# Patient Record
Sex: Female | Born: 1970 | Race: White | Hispanic: No | Marital: Single | Smoking: Never smoker
Health system: Southern US, Community
[De-identification: ages and names within clinical notes are randomized; demographics above are authoritative.]

## PROBLEM LIST (undated history)

## (undated) ENCOUNTER — Ambulatory Visit: Payer: Medicaid Other

## (undated) DIAGNOSIS — T424X1A Poisoning by benzodiazepines, accidental (unintentional), initial encounter: Secondary | ICD-10-CM

## (undated) DIAGNOSIS — I959 Hypotension, unspecified: Secondary | ICD-10-CM

## (undated) DIAGNOSIS — Z91410 Personal history of adult physical and sexual abuse: Secondary | ICD-10-CM

## (undated) DIAGNOSIS — F1320 Sedative, hypnotic or anxiolytic dependence, uncomplicated: Secondary | ICD-10-CM

## (undated) DIAGNOSIS — G8929 Other chronic pain: Secondary | ICD-10-CM

## (undated) DIAGNOSIS — I509 Heart failure, unspecified: Secondary | ICD-10-CM

## (undated) DIAGNOSIS — I499 Cardiac arrhythmia, unspecified: Secondary | ICD-10-CM

## (undated) DIAGNOSIS — J45909 Unspecified asthma, uncomplicated: Secondary | ICD-10-CM

## (undated) DIAGNOSIS — F1910 Other psychoactive substance abuse, uncomplicated: Secondary | ICD-10-CM

## (undated) DIAGNOSIS — G43909 Migraine, unspecified, not intractable, without status migrainosus: Secondary | ICD-10-CM

## (undated) DIAGNOSIS — M359 Systemic involvement of connective tissue, unspecified: Secondary | ICD-10-CM

## (undated) DIAGNOSIS — F603 Borderline personality disorder: Secondary | ICD-10-CM

## (undated) DIAGNOSIS — D509 Iron deficiency anemia, unspecified: Secondary | ICD-10-CM

## (undated) DIAGNOSIS — M545 Low back pain, unspecified: Secondary | ICD-10-CM

## (undated) DIAGNOSIS — F329 Major depressive disorder, single episode, unspecified: Secondary | ICD-10-CM

## (undated) DIAGNOSIS — F419 Anxiety disorder, unspecified: Secondary | ICD-10-CM

## (undated) DIAGNOSIS — I2699 Other pulmonary embolism without acute cor pulmonale: Secondary | ICD-10-CM

## (undated) DIAGNOSIS — E162 Hypoglycemia, unspecified: Secondary | ICD-10-CM

## (undated) DIAGNOSIS — M797 Fibromyalgia: Secondary | ICD-10-CM

## (undated) DIAGNOSIS — G9340 Encephalopathy, unspecified: Secondary | ICD-10-CM

## (undated) DIAGNOSIS — I2510 Atherosclerotic heart disease of native coronary artery without angina pectoris: Secondary | ICD-10-CM

## (undated) DIAGNOSIS — K219 Gastro-esophageal reflux disease without esophagitis: Secondary | ICD-10-CM

## (undated) DIAGNOSIS — E538 Deficiency of other specified B group vitamins: Secondary | ICD-10-CM

## (undated) DIAGNOSIS — K859 Acute pancreatitis without necrosis or infection, unspecified: Secondary | ICD-10-CM

## (undated) DIAGNOSIS — R55 Syncope and collapse: Secondary | ICD-10-CM

## (undated) DIAGNOSIS — I82409 Acute embolism and thrombosis of unspecified deep veins of unspecified lower extremity: Secondary | ICD-10-CM

## (undated) DIAGNOSIS — G894 Chronic pain syndrome: Secondary | ICD-10-CM

## (undated) DIAGNOSIS — D72819 Decreased white blood cell count, unspecified: Secondary | ICD-10-CM

## (undated) DIAGNOSIS — I639 Cerebral infarction, unspecified: Secondary | ICD-10-CM

## (undated) DIAGNOSIS — D689 Coagulation defect, unspecified: Secondary | ICD-10-CM

## (undated) DIAGNOSIS — F1120 Opioid dependence, uncomplicated: Secondary | ICD-10-CM

## (undated) DIAGNOSIS — R109 Unspecified abdominal pain: Secondary | ICD-10-CM

## (undated) DIAGNOSIS — E46 Unspecified protein-calorie malnutrition: Secondary | ICD-10-CM

## (undated) DIAGNOSIS — T50901A Poisoning by unspecified drugs, medicaments and biological substances, accidental (unintentional), initial encounter: Secondary | ICD-10-CM

## (undated) DIAGNOSIS — R9431 Abnormal electrocardiogram [ECG] [EKG]: Secondary | ICD-10-CM

## (undated) DIAGNOSIS — E559 Vitamin D deficiency, unspecified: Secondary | ICD-10-CM

## (undated) DIAGNOSIS — Z7901 Long term (current) use of anticoagulants: Secondary | ICD-10-CM

## (undated) DIAGNOSIS — Z8742 Personal history of other diseases of the female genital tract: Secondary | ICD-10-CM

## (undated) DIAGNOSIS — F1920 Other psychoactive substance dependence, uncomplicated: Secondary | ICD-10-CM

## (undated) DIAGNOSIS — F32A Depression, unspecified: Secondary | ICD-10-CM

## (undated) HISTORY — PX: COLONOSCOPY: SHX174

## (undated) HISTORY — DX: Other psychoactive substance dependence, uncomplicated: F19.20

## (undated) HISTORY — PX: IVC FILTER INSERTION: CATH118245

## (undated) HISTORY — PX: ABDOMINAL HYSTERECTOMY: SHX81

## (undated) HISTORY — DX: Atherosclerotic heart disease of native coronary artery without angina pectoris: I25.10

## (undated) HISTORY — PX: HERNIA REPAIR: SHX51

## (undated) HISTORY — DX: Other psychoactive substance abuse, uncomplicated: F19.10

## (undated) HISTORY — DX: Iron deficiency anemia, unspecified: D50.9

## (undated) HISTORY — PX: RESECTION SMALL BOWEL / CLOSURE ILEOSTOMY: SUR1248

## (undated) HISTORY — DX: Decreased white blood cell count, unspecified: D72.819

## (undated) HISTORY — DX: Syncope and collapse: R55

## (undated) HISTORY — DX: Personal history of adult physical and sexual abuse: Z91.410

## (undated) HISTORY — DX: Acute pancreatitis without necrosis or infection, unspecified: K85.90

## (undated) HISTORY — DX: Abnormal electrocardiogram (ECG) (EKG): R94.31

## (undated) HISTORY — PX: TONSILLECTOMY: SUR1361

## (undated) HISTORY — PX: UPPER GI ENDOSCOPY: SHX6162

## (undated) HISTORY — PX: CERVICAL CERCLAGE: SHX1329

## (undated) HISTORY — DX: Borderline personality disorder: F60.3

## (undated) HISTORY — DX: Anxiety disorder, unspecified: F41.9

## (undated) HISTORY — DX: Cardiac arrhythmia, unspecified: I49.9

## (undated) HISTORY — DX: Hypotension, unspecified: I95.9

## (undated) HISTORY — DX: Major depressive disorder, single episode, unspecified: F32.9

## (undated) HISTORY — PX: CHOLECYSTECTOMY: SHX55

## (undated) HISTORY — DX: Personal history of other diseases of the female genital tract: Z87.42

## (undated) HISTORY — DX: Coagulation defect, unspecified: D68.9

## (undated) HISTORY — PX: APPENDECTOMY: SHX54

## (undated) HISTORY — DX: Poisoning by benzodiazepines, accidental (unintentional), initial encounter: T42.4X1A

## (undated) HISTORY — DX: Unspecified protein-calorie malnutrition: E46

---

## 2001-02-18 HISTORY — PX: GASTRIC BYPASS: SHX52

## 2004-05-06 ENCOUNTER — Emergency Department: Payer: Self-pay | Admitting: Emergency Medicine

## 2004-06-02 ENCOUNTER — Emergency Department: Payer: Self-pay | Admitting: Emergency Medicine

## 2004-06-07 ENCOUNTER — Emergency Department: Payer: Self-pay | Admitting: Emergency Medicine

## 2004-06-30 ENCOUNTER — Other Ambulatory Visit: Payer: Self-pay

## 2004-06-30 ENCOUNTER — Inpatient Hospital Stay: Payer: Self-pay | Admitting: Internal Medicine

## 2004-07-01 ENCOUNTER — Other Ambulatory Visit: Payer: Self-pay

## 2004-08-11 ENCOUNTER — Other Ambulatory Visit: Payer: Self-pay

## 2004-08-11 ENCOUNTER — Emergency Department: Payer: Self-pay | Admitting: Emergency Medicine

## 2004-08-17 ENCOUNTER — Emergency Department: Payer: Self-pay | Admitting: Emergency Medicine

## 2004-08-17 ENCOUNTER — Other Ambulatory Visit: Payer: Self-pay

## 2004-10-09 ENCOUNTER — Other Ambulatory Visit: Payer: Self-pay

## 2004-10-10 ENCOUNTER — Inpatient Hospital Stay: Payer: Self-pay | Admitting: Internal Medicine

## 2004-10-11 ENCOUNTER — Other Ambulatory Visit: Payer: Self-pay

## 2004-11-26 ENCOUNTER — Other Ambulatory Visit: Payer: Self-pay

## 2004-11-26 ENCOUNTER — Emergency Department: Payer: Self-pay | Admitting: Emergency Medicine

## 2004-12-03 ENCOUNTER — Emergency Department: Payer: Self-pay | Admitting: Emergency Medicine

## 2005-01-01 ENCOUNTER — Emergency Department: Payer: Self-pay | Admitting: Emergency Medicine

## 2005-02-18 DIAGNOSIS — I2699 Other pulmonary embolism without acute cor pulmonale: Secondary | ICD-10-CM

## 2005-02-18 HISTORY — DX: Other pulmonary embolism without acute cor pulmonale: I26.99

## 2005-03-14 ENCOUNTER — Inpatient Hospital Stay: Payer: Self-pay | Admitting: Internal Medicine

## 2005-03-14 ENCOUNTER — Other Ambulatory Visit: Payer: Self-pay

## 2005-03-15 ENCOUNTER — Other Ambulatory Visit: Payer: Self-pay

## 2005-03-16 ENCOUNTER — Other Ambulatory Visit: Payer: Self-pay

## 2005-07-01 ENCOUNTER — Ambulatory Visit: Payer: Self-pay | Admitting: Cardiovascular Disease

## 2005-07-08 ENCOUNTER — Other Ambulatory Visit: Payer: Self-pay

## 2005-07-08 ENCOUNTER — Emergency Department: Payer: Self-pay | Admitting: Emergency Medicine

## 2005-08-31 ENCOUNTER — Emergency Department: Payer: Self-pay | Admitting: Emergency Medicine

## 2006-01-18 ENCOUNTER — Other Ambulatory Visit: Payer: Self-pay

## 2006-01-18 ENCOUNTER — Emergency Department: Payer: Self-pay | Admitting: Emergency Medicine

## 2006-03-27 ENCOUNTER — Emergency Department: Payer: Self-pay | Admitting: Unknown Physician Specialty

## 2006-04-11 ENCOUNTER — Other Ambulatory Visit: Payer: Self-pay

## 2006-04-11 ENCOUNTER — Emergency Department: Payer: Self-pay | Admitting: Emergency Medicine

## 2006-04-16 ENCOUNTER — Emergency Department: Payer: Self-pay | Admitting: General Practice

## 2006-04-23 ENCOUNTER — Emergency Department: Payer: Self-pay | Admitting: Emergency Medicine

## 2006-07-05 ENCOUNTER — Other Ambulatory Visit: Payer: Self-pay

## 2006-07-05 ENCOUNTER — Emergency Department: Payer: Self-pay | Admitting: Emergency Medicine

## 2006-08-28 ENCOUNTER — Emergency Department: Payer: Self-pay | Admitting: Internal Medicine

## 2006-08-28 ENCOUNTER — Other Ambulatory Visit: Payer: Self-pay

## 2006-09-03 ENCOUNTER — Observation Stay: Payer: Self-pay

## 2006-09-05 ENCOUNTER — Inpatient Hospital Stay: Payer: Self-pay | Admitting: Internal Medicine

## 2006-09-05 ENCOUNTER — Other Ambulatory Visit: Payer: Self-pay

## 2006-09-07 ENCOUNTER — Inpatient Hospital Stay: Payer: Self-pay | Admitting: Psychiatry

## 2006-09-19 ENCOUNTER — Emergency Department: Payer: Self-pay | Admitting: Emergency Medicine

## 2006-10-07 ENCOUNTER — Ambulatory Visit: Payer: Self-pay | Admitting: Vascular Surgery

## 2006-11-09 ENCOUNTER — Emergency Department: Payer: Self-pay | Admitting: Internal Medicine

## 2006-11-10 ENCOUNTER — Observation Stay: Payer: Self-pay | Admitting: Internal Medicine

## 2007-01-07 ENCOUNTER — Emergency Department: Payer: Self-pay | Admitting: Emergency Medicine

## 2007-08-19 ENCOUNTER — Ambulatory Visit: Payer: Self-pay | Admitting: Internal Medicine

## 2007-08-24 ENCOUNTER — Inpatient Hospital Stay: Payer: Self-pay | Admitting: Internal Medicine

## 2007-08-24 ENCOUNTER — Other Ambulatory Visit: Payer: Self-pay

## 2007-09-19 ENCOUNTER — Ambulatory Visit: Payer: Self-pay | Admitting: Internal Medicine

## 2007-12-11 ENCOUNTER — Emergency Department: Payer: Self-pay | Admitting: Emergency Medicine

## 2007-12-29 ENCOUNTER — Emergency Department: Payer: Self-pay | Admitting: Emergency Medicine

## 2008-03-20 ENCOUNTER — Ambulatory Visit: Payer: Self-pay | Admitting: Family Medicine

## 2009-01-06 ENCOUNTER — Emergency Department: Payer: Self-pay | Admitting: Emergency Medicine

## 2009-01-07 ENCOUNTER — Emergency Department: Payer: Self-pay | Admitting: Unknown Physician Specialty

## 2010-09-24 DIAGNOSIS — M545 Low back pain, unspecified: Secondary | ICD-10-CM | POA: Insufficient documentation

## 2010-09-24 DIAGNOSIS — F603 Borderline personality disorder: Secondary | ICD-10-CM | POA: Insufficient documentation

## 2010-09-25 DIAGNOSIS — D509 Iron deficiency anemia, unspecified: Secondary | ICD-10-CM | POA: Insufficient documentation

## 2010-09-25 DIAGNOSIS — Z95828 Presence of other vascular implants and grafts: Secondary | ICD-10-CM | POA: Insufficient documentation

## 2010-11-16 DIAGNOSIS — E538 Deficiency of other specified B group vitamins: Secondary | ICD-10-CM | POA: Insufficient documentation

## 2011-12-25 DIAGNOSIS — F319 Bipolar disorder, unspecified: Secondary | ICD-10-CM | POA: Insufficient documentation

## 2013-03-17 DIAGNOSIS — Z86718 Personal history of other venous thrombosis and embolism: Secondary | ICD-10-CM | POA: Insufficient documentation

## 2013-04-26 ENCOUNTER — Emergency Department (HOSPITAL_COMMUNITY): Payer: Medicaid Other

## 2013-04-26 ENCOUNTER — Encounter (HOSPITAL_COMMUNITY): Payer: Self-pay | Admitting: Internal Medicine

## 2013-04-26 ENCOUNTER — Inpatient Hospital Stay (HOSPITAL_COMMUNITY)
Admission: EM | Admit: 2013-04-26 | Discharge: 2013-04-29 | DRG: 071 | Disposition: A | Discharge status: Home / Self Care | Payer: Medicaid Other | Attending: Internal Medicine | Admitting: Internal Medicine

## 2013-04-26 DIAGNOSIS — I2699 Other pulmonary embolism without acute cor pulmonale: Secondary | ICD-10-CM | POA: Diagnosis present

## 2013-04-26 DIAGNOSIS — M47817 Spondylosis without myelopathy or radiculopathy, lumbosacral region: Secondary | ICD-10-CM | POA: Diagnosis present

## 2013-04-26 DIAGNOSIS — IMO0001 Reserved for inherently not codable concepts without codable children: Secondary | ICD-10-CM | POA: Diagnosis present

## 2013-04-26 DIAGNOSIS — R894 Abnormal immunological findings in specimens from other organs, systems and tissues: Secondary | ICD-10-CM | POA: Diagnosis present

## 2013-04-26 DIAGNOSIS — F3289 Other specified depressive episodes: Secondary | ICD-10-CM | POA: Diagnosis present

## 2013-04-26 DIAGNOSIS — F411 Generalized anxiety disorder: Secondary | ICD-10-CM | POA: Diagnosis present

## 2013-04-26 DIAGNOSIS — E162 Hypoglycemia, unspecified: Secondary | ICD-10-CM | POA: Diagnosis present

## 2013-04-26 DIAGNOSIS — R760 Raised antibody titer: Secondary | ICD-10-CM | POA: Diagnosis present

## 2013-04-26 DIAGNOSIS — Z7901 Long term (current) use of anticoagulants: Secondary | ICD-10-CM

## 2013-04-26 DIAGNOSIS — Z9884 Bariatric surgery status: Secondary | ICD-10-CM

## 2013-04-26 DIAGNOSIS — D61818 Other pancytopenia: Secondary | ICD-10-CM | POA: Diagnosis present

## 2013-04-26 DIAGNOSIS — K632 Fistula of intestine: Secondary | ICD-10-CM | POA: Diagnosis present

## 2013-04-26 DIAGNOSIS — Z86711 Personal history of pulmonary embolism: Secondary | ICD-10-CM

## 2013-04-26 DIAGNOSIS — R4182 Altered mental status, unspecified: Secondary | ICD-10-CM | POA: Diagnosis present

## 2013-04-26 DIAGNOSIS — M329 Systemic lupus erythematosus, unspecified: Secondary | ICD-10-CM | POA: Diagnosis present

## 2013-04-26 DIAGNOSIS — F419 Anxiety disorder, unspecified: Secondary | ICD-10-CM | POA: Diagnosis present

## 2013-04-26 DIAGNOSIS — F32A Depression, unspecified: Secondary | ICD-10-CM

## 2013-04-26 DIAGNOSIS — G9340 Encephalopathy, unspecified: Principal | ICD-10-CM | POA: Diagnosis present

## 2013-04-26 DIAGNOSIS — G4700 Insomnia, unspecified: Secondary | ICD-10-CM | POA: Diagnosis present

## 2013-04-26 DIAGNOSIS — Z86718 Personal history of other venous thrombosis and embolism: Secondary | ICD-10-CM

## 2013-04-26 DIAGNOSIS — F329 Major depressive disorder, single episode, unspecified: Secondary | ICD-10-CM | POA: Diagnosis present

## 2013-04-26 DIAGNOSIS — G894 Chronic pain syndrome: Secondary | ICD-10-CM | POA: Diagnosis present

## 2013-04-26 HISTORY — DX: Depression, unspecified: F32.A

## 2013-04-26 HISTORY — DX: Chronic pain syndrome: G89.4

## 2013-04-26 HISTORY — DX: Anxiety disorder, unspecified: F41.9

## 2013-04-26 HISTORY — DX: Other pulmonary embolism without acute cor pulmonale: I26.99

## 2013-04-26 HISTORY — DX: Major depressive disorder, single episode, unspecified: F32.9

## 2013-04-26 HISTORY — DX: Hypoglycemia, unspecified: E16.2

## 2013-04-26 HISTORY — DX: Long term (current) use of anticoagulants: Z79.01

## 2013-04-26 LAB — SALICYLATE LEVEL: Salicylate Lvl: <2 mg/dL — ABNORMAL LOW (ref 2.8–20.0)

## 2013-04-26 LAB — COMPREHENSIVE METABOLIC PANEL
ALT: 17 U/L (ref 0–35)
AST: 22 U/L (ref 0–37)
Albumin: 3.4 g/dL — ABNORMAL LOW (ref 3.5–5.2)
Alkaline Phosphatase: 96 U/L (ref 39–117)
BUN: 18 mg/dL (ref 6–23)
CO2: 27 mEq/L (ref 19–32)
Calcium: 9 mg/dL (ref 8.4–10.5)
Chloride: 104 mEq/L (ref 96–112)
Creatinine, Ser: 0.72 mg/dL (ref 0.50–1.10)
GFR calc Af Amer: >90 mL/min (ref >90)
GFR calc non Af Amer: >90 mL/min (ref >90)
Glucose, Bld: 94 mg/dL (ref 70–99)
Potassium: 3.9 mEq/L (ref 3.7–5.3)
Sodium: 142 mEq/L (ref 137–147)
Total Bilirubin: 0.2 mg/dL — ABNORMAL LOW (ref 0.3–1.2)
Total Protein: 6.5 g/dL (ref 6.0–8.3)

## 2013-04-26 LAB — CBC WITH DIFFERENTIAL/PLATELET
Basophils Absolute: 0 10*3/uL (ref 0.0–0.1)
Basophils Relative: 2 % — ABNORMAL HIGH (ref 0–1)
Eosinophils Absolute: 0.1 10*3/uL (ref 0.0–0.7)
Eosinophils Relative: 2 % (ref 0–5)
HCT: 32.1 % — ABNORMAL LOW (ref 36.0–46.0)
Hemoglobin: 10.2 g/dL — ABNORMAL LOW (ref 12.0–15.0)
Lymphocytes Relative: 45 % (ref 12–46)
Lymphs Abs: 1.2 10*3/uL (ref 0.7–4.0)
MCH: 27.6 pg (ref 26.0–34.0)
MCHC: 31.8 g/dL (ref 30.0–36.0)
MCV: 87 fL (ref 78.0–100.0)
Monocytes Absolute: 0.2 10*3/uL (ref 0.1–1.0)
Monocytes Relative: 8 % (ref 3–12)
Neutro Abs: 1.1 10*3/uL — ABNORMAL LOW (ref 1.7–7.7)
Neutrophils Relative %: 43 % (ref 43–77)
Platelets: 142 10*3/uL — ABNORMAL LOW (ref 150–400)
RBC: 3.69 MIL/uL — ABNORMAL LOW (ref 3.87–5.11)
RDW: 13.5 % (ref 11.5–15.5)
WBC: 2.6 10*3/uL — ABNORMAL LOW (ref 4.0–10.5)

## 2013-04-26 LAB — RAPID URINE DRUG SCREEN, HOSP PERFORMED
Amphetamines: NOT DETECTED
Barbiturates: NOT DETECTED
Benzodiazepines: NOT DETECTED
Cocaine: NOT DETECTED
Opiates: NOT DETECTED
Tetrahydrocannabinol: NOT DETECTED

## 2013-04-26 LAB — URINALYSIS, ROUTINE W REFLEX MICROSCOPIC
Bilirubin Urine: NEGATIVE
Glucose, UA: NEGATIVE mg/dL
Hgb urine dipstick: NEGATIVE
Ketones, ur: NEGATIVE mg/dL
Leukocytes, UA: NEGATIVE
Nitrite: NEGATIVE
Protein, ur: NEGATIVE mg/dL
Specific Gravity, Urine: <1.005 — ABNORMAL LOW (ref 1.005–1.030)
Urobilinogen, UA: 0.2 mg/dL (ref 0.0–1.0)
pH: 5.5 (ref 5.0–8.0)

## 2013-04-26 LAB — BLOOD GAS, ARTERIAL
Acid-Base Excess: 3.5 mmol/L — ABNORMAL HIGH (ref 0.0–2.0)
Bicarbonate: 27.3 mEq/L — ABNORMAL HIGH (ref 20.0–24.0)
Drawn by: 21310
O2 Content: 2 L/min
O2 Saturation: 98.7 %
Patient temperature: 37
TCO2: 24.6 mmol/L (ref 0–100)
pCO2 arterial: 39.6 mmHg (ref 35.0–45.0)
pH, Arterial: 7.453 — ABNORMAL HIGH (ref 7.350–7.450)
pO2, Arterial: 153 mmHg — ABNORMAL HIGH (ref 80.0–100.0)

## 2013-04-26 LAB — ETHANOL: Alcohol, Ethyl (B): <11 mg/dL (ref 0–11)

## 2013-04-26 LAB — PREGNANCY, URINE: Preg Test, Ur: NEGATIVE

## 2013-04-26 LAB — TROPONIN I: Troponin I: <0.3 ng/mL (ref <0.30)

## 2013-04-26 LAB — ACETAMINOPHEN LEVEL: Acetaminophen (Tylenol), Serum: <15 ug/mL (ref 10–30)

## 2013-04-26 MED ORDER — FLUMAZENIL 0.5 MG/5ML IV SOLN
INTRAVENOUS | Status: AC
  Administered 2013-04-26: 0.2 mg via INTRAVENOUS
  Filled 2013-04-26: qty 5

## 2013-04-26 MED ORDER — LIDOCAINE HCL (PF) 1 % IJ SOLN
INTRAMUSCULAR | Status: AC
  Administered 2013-04-26: 22:00:00
  Filled 2013-04-26: qty 5

## 2013-04-26 MED ORDER — FLUMAZENIL 0.5 MG/5ML IV SOLN
0.2000 mg | Freq: Once | INTRAVENOUS | Status: AC
  Administered 2013-04-26: 0.2 mg via INTRAVENOUS

## 2013-04-26 MED ORDER — NALOXONE HCL 1 MG/ML IJ SOLN
2.0000 mg | Freq: Once | INTRAMUSCULAR | Status: AC
  Administered 2013-04-26: 2 mg via INTRAVENOUS
  Filled 2013-04-26: qty 2

## 2013-04-26 NOTE — ED Notes (Signed)
Pt has IO right leg,  pressure bag at [facility], infusing without difficulty,

## 2013-04-26 NOTE — ED Notes (Signed)
Pt slumped over the self checkout kiosk at [employer]. When ems arrived pt was in wheelchair and had an episode incontinence. EMS gave pt 2mg  of narcan IM and at this time Pt is starting to move around and responds to painful stimuli.

## 2013-04-26 NOTE — ED Provider Notes (Signed)
CSN: [id number]     Arrival date & time    History    Chief Complaint  Patient presents with  . Altered Mental Status   The history is provided by the EMS personnel. No language interpreter was used.   HPI Comments: Redith Drach is a 43 y.o. female who presents to the Emergency Department for altered mental status. EMS was called after patient was found at [employer] slumped forward in a seated position. She was poorly arousable. On EMS arrival patient was only responsive to painful stimuli. She is given 2 mg of Narcan without any clinical response. No reported trauma. Additionally this may provide any useful history. There is no additional family or friends at bedside. She did have paperwork on her from such [facility] hospital listing several medications. They did not indicate why she was there. She was recently discharged. No past medical records with the current system.   No past medical history on file. No past surgical history on file. No family history on file. History  Substance Use Topics  . Smoking status: Not on file  . Smokeless tobacco: Not on file  . Alcohol Use: Not on file   OB History   No data available     Review of Systems  All systems reviewed and negative, other than as noted in HPI.   Allergies  Review of patient's allergies indicates not on file.  Home Medications   Current Outpatient Rx  Name  Route  Sig  Dispense  Refill  . bisacodyl (DULCOLAX) 5 MG EC tablet   Oral   Take 10 mg by mouth daily. To be taken on an empty stomach         . Cholecalciferol (VITAMIN D) 2000 UNITS CAPS   Oral   Take 2,000 Units by mouth daily.         Marland Kitchen docusate sodium (COLACE) 100 MG capsule   Oral   Take 100 mg by mouth 2 (two) times daily.         . fluticasone (FLONASE) 50 MCG/ACT nasal spray   Each Nare   Place 1 spray into both nostrils daily.         Marland Kitchen gabapentin (NEURONTIN) 600 MG tablet   Oral   Take 1,200 mg by mouth 3 (three) times daily.          . Liniments (ANALGESIC BALM EX)   Apply externally   Apply 1 application topically 4 (four) times daily as needed (for pain).         . magnesium oxide (MAG-OX) 400 MG tablet   Oral   Take 400 mg by mouth 3 (three) times daily.         . methocarbamol (ROBAXIN) 750 MG tablet   Oral   Take 750 mg by mouth 3 (three) times daily.         Marland Kitchen OLANZapine (ZYPREXA) 10 MG tablet   Oral   Take 10 mg by mouth at bedtime.         Marland Kitchen omeprazole (PRILOSEC) 20 MG capsule   Oral   Take 40 mg by mouth daily.         . ondansetron (ZOFRAN) 4 MG tablet   Oral   Take 8 mg by mouth 3 (three) times daily as needed for nausea or vomiting.         . propranolol (INDERAL) 40 MG tablet   Oral   Take 40 mg by mouth 3 (three) times daily.         Marland Kitchen  Rivaroxaban (XARELTO) 20 MG TABS tablet   Oral   Take 20 mg by mouth daily with supper.         . traZODone (DESYREL) 100 MG tablet   Oral   Take 200 mg by mouth at bedtime.         . vitamin B-12 (CYANOCOBALAMIN) 1000 MCG tablet   Oral   Take 1,000 mcg by mouth daily.          BP 132/93  Pulse 84  Resp 20  SpO2 99% Physical Exam  Nursing note and vitals reviewed. Constitutional:  Laying in bed with eyes closed.   HENT:  Head: Normocephalic and atraumatic.  Eyes: Conjunctivae are normal. Pupils are equal, round, and reactive to light. Right eye exhibits no discharge. Left eye exhibits no discharge.  Neck: Neck supple.  Cardiovascular: Normal rate, regular rhythm and normal heart sounds.  Exam reveals no gallop and no friction rub.   No murmur heard. Pulmonary/Chest: Effort normal and breath sounds normal. No respiratory distress.  Abdominal: Soft. She exhibits no distension. There is no tenderness.  Two small wounds/tracts lower abdomen along what appears to be otherwise well healed surgical incision  Genitourinary:  Incontinent of urine  Musculoskeletal: She exhibits no edema and no tenderness.  No external signs  of acute trauma.   Neurological:  GCS 10. E2, V3, M5. Moving all extremities equally. Not following commands. Combative when stimulated, otherwise laying in bed with sonorous respirations.   Skin: Skin is warm and dry.    ED Course  IO LINE INSERTION Date/Time: 04/26/2013 9:32 PM Performed by: Virgel Manifold Authorized by: Virgel Manifold Consent: The procedure was performed in an emergent situation. Patient identity confirmed: provided demographic data and arm band Indications: medication administration and rapid vascular access Insertion site: right proximal tibia Site preparation: chlorhexidine Insertion device: drill device Insertion: needle was inserted through the bony cortex Number of attempts: 1 Confirmation method: easy infusion of fluids and aspiration of blood/marrow Secured with: protective shield Patient tolerance: Patient tolerated the procedure well with no immediate complications. Comments: Multiple attempts at PIV by nursing and myself. Pt continues to be combative with painful stimuli.    CENTRAL LINE Performed by: Virgel Manifold Consent: The procedure was performed in an emergent situation. Required items: required blood products, implants, devices, and special equipment available Patient identity confirmed: arm band and provided demographic data Time out: Immediately prior to procedure a "time out" was called to verify the correct patient, procedure, equipment, support staff and site/side marked as required. Indications: vascular access, lost IO, no peripheral. Anesthesia: local infiltration Local anesthetic: lidocaine 1% with epinephrine Anesthetic total: 3 ml Patient sedated: no Preparation: skin prepped with 2% chlorhexidine Skin prep agent dried: skin prep agent completely dried prior to procedure Sterile barriers: all five maximum sterile barriers used - cap, mask, sterile gown, sterile gloves, and large sterile sheet Hand hygiene: hand hygiene performed  prior to central venous catheter insertion  Location details: R femoral. Pt combative with painful stimuli. Did not want to sedate further. On xarelto so avoided subclavian. Felt femoral would be safer than IJ.   Catheter type: triple lumen Catheter size: 8 Fr Pre-procedure: landmarks identified Ultrasound guidance: yes Successful placement: yes Post-procedure: line sutured and dressing applied Assessment: blood return through all parts, free fluid flow. Patient tolerance: Patient tolerated the procedure well with no immediate complications.   CRITICAL CARE Performed by: Virgel Manifold  Total critical care time: 40 minutes  Critical care time was  exclusive of separately billable procedures and treating other patients. Critical care was necessary to treat or prevent imminent or life-threatening deterioration. Critical care was time spent personally by me on the following activities: development of treatment plan with patient and/or surrogate as well as nursing, discussions with consultants, evaluation of patient's response to treatment, examination of patient, obtaining history from patient or surrogate, ordering and performing treatments and interventions, ordering and review of laboratory studies, ordering and review of radiographic studies, pulse oximetry and re-evaluation of patient's condition.    (including critical care time) DIAGNOSTIC STUDIES: Oxygen Saturation is 99% on room air, normal by my interpretation.    COORDINATION OF CARE:   Labs Review Labs Reviewed  CBC WITH DIFFERENTIAL - Abnormal; Notable for the following:    WBC 2.6 (*)    RBC 3.69 (*)    Hemoglobin 10.2 (*)    HCT 32.1 (*)    Platelets 142 (*)    Neutro Abs 1.1 (*)    Basophils Relative 2 (*)    All other components within normal limits  COMPREHENSIVE METABOLIC PANEL - Abnormal; Notable for the following:    Albumin 3.4 (*)    Total Bilirubin 0.2 (*)    All other components within normal limits   URINALYSIS, ROUTINE W REFLEX MICROSCOPIC - Abnormal; Notable for the following:    Specific Gravity, Urine <1.005 (*)    All other components within normal limits  SALICYLATE LEVEL - Abnormal; Notable for the following:    Salicylate Lvl 123456 (*)    All other components within normal limits  BLOOD GAS, ARTERIAL - Abnormal; Notable for the following:    pH, Arterial 7.453 (*)    pO2, Arterial 153.0 (*)    Bicarbonate 27.3 (*)    Acid-Base Excess 3.5 (*)    All other components within normal limits  CBC - Abnormal; Notable for the following:    WBC 3.9 (*)    Hemoglobin 11.1 (*)    HCT 34.0 (*)    Platelets 143 (*)    All other components within normal limits  GLUCOSE, CAPILLARY - Abnormal; Notable for the following:    Glucose-Capillary 104 (*)    All other components within normal limits  MRSA PCR SCREENING  ACETAMINOPHEN LEVEL  ETHANOL  URINE RAPID DRUG SCREEN (HOSP PERFORMED)  TROPONIN I  PREGNANCY, URINE  HIV ANTIBODY (ROUTINE TESTING)  HEPATITIS PANEL, ACUTE  PROTIME-INR  BASIC METABOLIC PANEL  TROPONIN I  TROPONIN I  GLUCOSE, CAPILLARY  VITAMIN B12  TSH   Imaging Review Ct Head Wo Contrast  04/26/2013   CLINICAL DATA:  Altered mental status  EXAM: CT HEAD WITHOUT CONTRAST  TECHNIQUE: Contiguous axial images were obtained from the base of the skull through the vertex without intravenous contrast.  COMPARISON:  None.  FINDINGS: No CT evidence of acute infarction. No intraparenchymal hemorrhage, mass, mass effect, or abnormal extra-axial fluid collection. The ventricles, cisterns, and sulci are normal in size, shape, and position. The visualized paranasal sinuses and mastoid air cells are predominantly clear. No displaced calvarial fracture.  IMPRESSION: No CT evidence of an acute intracranial abnormality.  Recommend MRI if concern for acute intracranial pathology persists.   Electronically Signed   By: Carlos Levering M.D.   On: 04/26/2013 22:51   Dg Chest Port 1  View  04/27/2013   CLINICAL DATA Status post PICC line placement  EXAM PORTABLE CHEST - 1 VIEW  COMPARISON DG CHEST 1V PORT dated 04/26/2013  FINDINGS The patient has  undergone placement of a PICC line via the right upper extremity. The tip of the catheter lies in the region of the midportion of the SVC. The lungs are borderline hypoinflated but clear. There is no pneumothorax or pleural effusion or infiltrate. The cardiopericardial silhouette is normal in size. The pulmonary vascularity is not engorged.  IMPRESSION There is no evidence of a postprocedure complication following placement of a right-sided PICC line.  SIGNATURE  Electronically Signed   By: David  Martinique   On: 04/27/2013 14:26   Dg Chest Portable 1 View  04/26/2013   CLINICAL DATA:  Altered mental status.  EXAM: PORTABLE CHEST - 1 VIEW  COMPARISON:  None available for comparison at time of study interpretation.  FINDINGS: Cardiomediastinal silhouette is unremarkable for this low inspiratory portable examination with crowded vasculature markings. Mild bibasilar strandy densities. The lungs are otherwise clear without pleural effusions or focal consolidations. Trachea projects midline and there is no pneumothorax. Included soft tissue planes and osseous structures are non-suspicious. Multiple EKG lines overlie the patient and may obscure subtle underlying pathology.  IMPRESSION: Mild bibasilar atelectasis in this low inspiratory portable examination.   Electronically Signed   By: Elon Alas   On: 04/26/2013 22:20     EKG Interpretation None      EKG:  Rhythm: normal sinus Rate: 80 normal ST segments: artifact in several leads, but appear pretty normal Comparison: no old   MDM   Final diagnoses:  Encephalopathy acute  Neutropenia  43 year old female with altered mental status. Etiology is not completely clear. Suspect overdose. W/u pretty unremarkable aside from neutropenia. Afebrile. HD stable. Doubt infectious. CT head  negative. Protecting airway. Admit for further tx/management.       Virgel Manifold, MD 04/27/13 [phone]

## 2013-04-26 NOTE — ED Notes (Signed)
Pt to room via EMS. Per EMS, pt found at [place] slumped over kiosk . Pt somnolent with flailing motions of arms and legs when stimulated. No response to nailbed pressure. Responds to sternal rub , mumbles, then somnolent again.

## 2013-04-27 ENCOUNTER — Encounter (HOSPITAL_COMMUNITY): Payer: Self-pay | Admitting: Internal Medicine

## 2013-04-27 ENCOUNTER — Inpatient Hospital Stay (HOSPITAL_COMMUNITY): Payer: Medicaid Other

## 2013-04-27 ENCOUNTER — Encounter (HOSPITAL_COMMUNITY): Payer: Medicaid Other

## 2013-04-27 DIAGNOSIS — F329 Major depressive disorder, single episode, unspecified: Secondary | ICD-10-CM

## 2013-04-27 DIAGNOSIS — I2699 Other pulmonary embolism without acute cor pulmonale: Secondary | ICD-10-CM

## 2013-04-27 DIAGNOSIS — R4182 Altered mental status, unspecified: Secondary | ICD-10-CM | POA: Diagnosis present

## 2013-04-27 DIAGNOSIS — F419 Anxiety disorder, unspecified: Secondary | ICD-10-CM | POA: Diagnosis present

## 2013-04-27 DIAGNOSIS — D61818 Other pancytopenia: Secondary | ICD-10-CM

## 2013-04-27 DIAGNOSIS — G9340 Encephalopathy, unspecified: Principal | ICD-10-CM

## 2013-04-27 DIAGNOSIS — E162 Hypoglycemia, unspecified: Secondary | ICD-10-CM | POA: Diagnosis present

## 2013-04-27 DIAGNOSIS — F3289 Other specified depressive episodes: Secondary | ICD-10-CM

## 2013-04-27 DIAGNOSIS — G894 Chronic pain syndrome: Secondary | ICD-10-CM | POA: Diagnosis present

## 2013-04-27 DIAGNOSIS — Z7901 Long term (current) use of anticoagulants: Secondary | ICD-10-CM

## 2013-04-27 LAB — BASIC METABOLIC PANEL
BUN: 15 mg/dL (ref 6–23)
CALCIUM: 9.4 mg/dL (ref 8.4–10.5)
CO2: 28 mEq/L (ref 19–32)
CREATININE: 0.67 mg/dL (ref 0.50–1.10)
Chloride: 105 mEq/L (ref 96–112)
GFR calc Af Amer: >90 mL/min (ref >90)
Glucose, Bld: 99 mg/dL (ref 70–99)
Potassium: 4 mEq/L (ref 3.7–5.3)
SODIUM: 144 meq/L (ref 137–147)

## 2013-04-27 LAB — CBC
HCT: 34 % — ABNORMAL LOW (ref 36.0–46.0)
Hemoglobin: 11.1 g/dL — ABNORMAL LOW (ref 12.0–15.0)
MCH: 28.4 pg (ref 26.0–34.0)
MCHC: 32.6 g/dL (ref 30.0–36.0)
MCV: 87 fL (ref 78.0–100.0)
PLATELETS: 143 10*3/uL — AB (ref 150–400)
RBC: 3.91 MIL/uL (ref 3.87–5.11)
RDW: 13.4 % (ref 11.5–15.5)
WBC: 3.9 10*3/uL — ABNORMAL LOW (ref 4.0–10.5)

## 2013-04-27 LAB — GLUCOSE, CAPILLARY
GLUCOSE-CAPILLARY: 88 mg/dL (ref 70–99)
Glucose-Capillary: 104 mg/dL — ABNORMAL HIGH (ref 70–99)
Glucose-Capillary: 44 mg/dL — CL (ref 70–99)
Glucose-Capillary: 154 mg/dL — ABNORMAL HIGH (ref 70–99)

## 2013-04-27 LAB — HEPATITIS PANEL, ACUTE
HCV AB: NEGATIVE
HEP A IGM: NONREACTIVE
HEP B C IGM: NONREACTIVE
HEP B S AG: NEGATIVE

## 2013-04-27 LAB — MRSA PCR SCREENING: MRSA by PCR: NEGATIVE

## 2013-04-27 LAB — VITAMIN B12: VITAMIN B 12: 735 pg/mL (ref 211–911)

## 2013-04-27 LAB — HIV ANTIBODY (ROUTINE TESTING W REFLEX): HIV: NONREACTIVE

## 2013-04-27 LAB — TROPONIN I: Troponin I: <0.3 ng/mL (ref <0.30)

## 2013-04-27 LAB — TSH: TSH: 2.066 u[IU]/mL (ref 0.350–4.500)

## 2013-04-27 LAB — PROTIME-INR
INR: 1.13 (ref 0.00–1.49)
PROTHROMBIN TIME: 14.3 s (ref 11.6–15.2)

## 2013-04-27 MED ORDER — FOLIC ACID 5 MG/ML IJ SOLN
INTRAMUSCULAR | Status: AC
  Filled 2013-04-27: qty 0.2

## 2013-04-27 MED ORDER — SODIUM CHLORIDE 0.9 % IJ SOLN
10.0000 mL | Freq: Two times a day (BID) | INTRAMUSCULAR | Status: DC
  Administered 2013-04-27 – 2013-04-28 (×2): 10 mL

## 2013-04-27 MED ORDER — OXYCODONE HCL 5 MG PO TABS
5.0000 mg | ORAL_TABLET | ORAL | Status: DC | PRN
  Administered 2013-04-27 – 2013-04-29 (×7): 5 mg via ORAL
  Filled 2013-04-27 (×7): qty 1

## 2013-04-27 MED ORDER — M.V.I. ADULT IV INJ
INJECTION | Freq: Once | INTRAVENOUS | Status: AC
  Administered 2013-04-27: 03:00:00 via INTRAVENOUS
  Filled 2013-04-27: qty 1000

## 2013-04-27 MED ORDER — RIVAROXABAN 20 MG PO TABS
20.0000 mg | ORAL_TABLET | Freq: Every day | ORAL | Status: DC
  Administered 2013-04-27 – 2013-04-28 (×2): 20 mg via ORAL
  Filled 2013-04-27 (×2): qty 1

## 2013-04-27 MED ORDER — ONDANSETRON HCL 4 MG/2ML IJ SOLN: 4.0000 mg | Freq: Four times a day (QID) | INTRAMUSCULAR | Status: DC | PRN

## 2013-04-27 MED ORDER — ONDANSETRON HCL 4 MG PO TABS
4.0000 mg | ORAL_TABLET | Freq: Four times a day (QID) | ORAL | Status: DC | PRN
  Administered 2013-04-27: 4 mg via ORAL
  Filled 2013-04-27: qty 1

## 2013-04-27 MED ORDER — ACETAMINOPHEN 650 MG RE SUPP: 650.0000 mg | Freq: Four times a day (QID) | RECTAL | Status: DC | PRN

## 2013-04-27 MED ORDER — ALBUTEROL SULFATE (2.5 MG/3ML) 0.083% IN NEBU: 2.5000 mg | INHALATION_SOLUTION | RESPIRATORY_TRACT | Status: DC | PRN

## 2013-04-27 MED ORDER — OXYCODONE HCL ER 20 MG PO T12A
20.0000 mg | EXTENDED_RELEASE_TABLET | Freq: Two times a day (BID) | ORAL | Status: DC
  Administered 2013-04-27 – 2013-04-29 (×5): 20 mg via ORAL
  Filled 2013-04-27 (×5): qty 1

## 2013-04-27 MED ORDER — ZOLPIDEM TARTRATE 5 MG PO TABS
5.0000 mg | ORAL_TABLET | Freq: Every day | ORAL | Status: DC
  Administered 2013-04-27 – 2013-04-28 (×2): 5 mg via ORAL
  Filled 2013-04-27 (×2): qty 1

## 2013-04-27 MED ORDER — THIAMINE HCL 100 MG/ML IJ SOLN
INTRAMUSCULAR | Status: AC
  Filled 2013-04-27: qty 2

## 2013-04-27 MED ORDER — SODIUM CHLORIDE 0.9 % IJ SOLN
10.0000 mL | INTRAMUSCULAR | Status: DC | PRN
  Administered 2013-04-27: 20 mL

## 2013-04-27 MED ORDER — KCL IN DEXTROSE-NACL 20-5-0.9 MEQ/L-%-% IV SOLN
INTRAVENOUS | Status: DC
  Administered 2013-04-27 – 2013-04-29 (×4): via INTRAVENOUS

## 2013-04-27 MED ORDER — SODIUM CHLORIDE 0.9 % IV SOLN
INTRAVENOUS | Status: DC
  Administered 2013-04-27 (×2): via INTRAVENOUS

## 2013-04-27 MED ORDER — PANTOPRAZOLE SODIUM 40 MG IV SOLR
40.0000 mg | INTRAVENOUS | Status: DC
  Administered 2013-04-27: 40 mg via INTRAVENOUS
  Filled 2013-04-27: qty 40

## 2013-04-27 MED ORDER — PANTOPRAZOLE SODIUM 40 MG PO TBEC
40.0000 mg | DELAYED_RELEASE_TABLET | Freq: Every day | ORAL | Status: DC
  Administered 2013-04-27 – 2013-04-29 (×3): 40 mg via ORAL
  Filled 2013-04-27 (×3): qty 1

## 2013-04-27 MED ORDER — SODIUM CHLORIDE 0.9 % IJ SOLN: 3.0000 mL | Freq: Two times a day (BID) | INTRAMUSCULAR | Status: DC

## 2013-04-27 MED ORDER — GLUCAGON HCL (RDNA) 1 MG IJ SOLR
1.0000 mg | Freq: Once | INTRAMUSCULAR | Status: AC | PRN
Start: 2013-04-27 — End: 2013-04-27

## 2013-04-27 MED ORDER — CLONAZEPAM 0.5 MG PO TABS
1.0000 mg | ORAL_TABLET | Freq: Three times a day (TID) | ORAL | Status: DC
  Administered 2013-04-27 – 2013-04-29 (×8): 1 mg via ORAL
  Filled 2013-04-27 (×8): qty 2

## 2013-04-27 MED ORDER — M.V.I. ADULT IV INJ
INJECTION | INTRAVENOUS | Status: AC
  Filled 2013-04-27: qty 10

## 2013-04-27 MED ORDER — ONDANSETRON 4 MG PO TBDP
4.0000 mg | ORAL_TABLET | Freq: Three times a day (TID) | ORAL | Status: DC | PRN
  Filled 2013-04-27: qty 1

## 2013-04-27 MED ORDER — TRAZODONE HCL 50 MG PO TABS
50.0000 mg | ORAL_TABLET | Freq: Every day | ORAL | Status: DC
  Administered 2013-04-27 – 2013-04-28 (×2): 50 mg via ORAL
  Filled 2013-04-27 (×2): qty 1

## 2013-04-27 MED ORDER — ACETAMINOPHEN 325 MG PO TABS
650.0000 mg | ORAL_TABLET | Freq: Four times a day (QID) | ORAL | Status: DC | PRN
  Administered 2013-04-27: 650 mg via ORAL
  Filled 2013-04-27: qty 2

## 2013-04-27 NOTE — Progress Notes (Addendum)
Pt arrived to the unit in restraints but seems to be calm and oriented to person and place. Restraints removed and pt transferred to ICU bed. Pt able to answer simple questions though speech is slurred; able to follow commands. No sustained alert periods. Pt has a visitor that she refers to as her roommate (listed in contacts as Jewel), that states pt has not yet moved in. Jewel states that she picked pt up from behavioral health in [place] earlier in the day 04/26/13 and took her to [place] to get toiletries. She dropped her off at the door and was gone for about 20 minutes. When she returned, she noticed EMS was at the store and Ms. Wahba was the person on the stretcher. I asked Jewel who the next of kin was and she stated that it would be Ms. Mak's mother but that she doesn't get along with her mother and she doesn't have any contact information for any of her family members. 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  Was also told that pt may be allergic to ability and possibly lyrica. Paperwork from another hospital found in pts belongings states that she is allergic to penicillans.

## 2013-04-27 NOTE — ED Notes (Signed)
Visitor at the bedside, Pt ready to go to ICU, report given to [facility]

## 2013-04-27 NOTE — Progress Notes (Signed)
ANTICOAGULATION CONSULT NOTE - Initial Consult  Pharmacy Consult for Xarelto (resume home Rx) Indication: pulmonary embolus  Not on File  Patient Measurements: Height: 5\' 1"  (154.9 cm) Weight: 181 lb 3.5 oz (82.2 kg) IBW/kg (Calculated) : 47.8  Vital Signs: Temp: 97.7 F (36.5 C) (03/10 0400) Temp src: Axillary (03/10 0400) BP: 117/62 mmHg (03/10 0930) Pulse Rate: 74 (03/10 0100)  Labs:  Recent Labs  04/26/13 2131 04/26/13 2358 04/27/13 0213 04/27/13 0821  HGB 10.2*  --  11.1*  --   HCT 32.1*  --  34.0*  --   PLT 142*  --  143*  --   LABPROT  --  14.3  --   --   INR  --  1.13  --   --   CREATININE 0.72  --  0.67  --   TROPONINI <0.30  --  <0.30 <0.30    Estimated Creatinine Clearance: 90.9 ml/min (by C-G formula based on Cr of 0.67).   Medical History: Past Medical History  Diagnosis Date  . Depression   . Chronic anticoagulation   . Pulmonary emboli 2007  . Chronic pain syndrome   . Chronic anxiety   . Non-diabetic hypoglycemia     Medications:  Prescriptions prior to admission  Medication Sig Dispense Refill  . bisacodyl (DULCOLAX) 5 MG EC tablet Take 10 mg by mouth daily. To be taken on an empty stomach      . Cholecalciferol (VITAMIN D) 2000 UNITS CAPS Take 2,000 Units by mouth daily.      . clonazePAM (KLONOPIN) 2 MG tablet Take 2 mg by mouth 3 (three) times daily.      Marland Kitchen docusate sodium (COLACE) 100 MG capsule Take 100 mg by mouth 2 (two) times daily.      . fluticasone (FLONASE) 50 MCG/ACT nasal spray Place 1 spray into both nostrils daily.      Marland Kitchen gabapentin (NEURONTIN) 600 MG tablet Take 1,200 mg by mouth 3 (three) times daily.      . Liniments (ANALGESIC BALM EX) Apply 1 application topically 4 (four) times daily as needed (for pain).      . magnesium oxide (MAG-OX) 400 MG tablet Take 400 mg by mouth 3 (three) times daily.      . methocarbamol (ROBAXIN) 750 MG tablet Take 750 mg by mouth 3 (three) times daily.      Marland Kitchen OLANZapine (ZYPREXA) 10 MG  tablet Take 10 mg by mouth at bedtime.      Marland Kitchen omeprazole (PRILOSEC) 20 MG capsule Take 40 mg by mouth daily.      . ondansetron (ZOFRAN) 4 MG tablet Take 8 mg by mouth 3 (three) times daily as needed for nausea or vomiting.      . propranolol (INDERAL) 40 MG tablet Take 40 mg by mouth 3 (three) times daily.      . Rivaroxaban (XARELTO) 20 MG TABS tablet Take 20 mg by mouth daily with supper.      . traZODone (DESYREL) 100 MG tablet Take 200 mg by mouth at bedtime.      . vitamin B-12 (CYANOCOBALAMIN) 1000 MCG tablet Take 1,000 mcg by mouth daily.        Assessment: 43yo female admitted for AMS.  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  Home dose listed above.  Estimated Creatinine Clearance: 90.9 ml/min (by C-G formula based on Cr of 0.67).  Goal of Therapy:  Anticoagulation with Xarelto Monitor  platelets by anticoagulation protocol: Yes   Plan:  Xarelto 20mg  PO daily with supper (home dose) Monitor CBC  Shrita Thien A 04/27/2013,10:28 AM

## 2013-04-27 NOTE — Progress Notes (Signed)
ALL OF PT'S HOME MEDS COUNTED, LISTED AND TAKEN TO PHARMACY DEPT.TO BE LOCKED UP FOR PT. PT L9IST FORMS X2 PLACED IN FRONT OF CHART W/ FORM FOR PT'S VALUABLES THAT WERE LOCKED UP BY SECURITY ON ADMISSION. PT AWARE OF ALL OF THIS.

## 2013-04-27 NOTE — H&P (Signed)
Patient's PCP: PCP unknown  Chief Complaint: Altered mental status  History of Present Illness: Leslie Marks is a 43 y.o. Caucasian female with unknown past medical history, possibly depression, questionable history of drug abuse, and chronic anticoagulation on Rivaroxaban for unclear etiology who presents with the above complaints.  Patient is confused and is unable to provide any history.  Patient was found at [place] at the checkout kiosk slumped.  EMS was called, there was question of incontinence.  Patient was confused and had non-voluntary movements.  Patient received 2 doses of Narcan first by EMS and emergency department with questionable response.  Due to lack of IV access, an IO was placed on her right shin, however due to IV infiltration/possible hematoma it was removed.  By the time I evaluated the patient, patient would spontaneously open her eyes to stimuli and would answer to some simple questions, like she was in the hospital.  Attempted to give one dose of Flumazenil, patient may not have received adequate dose as her IO had infiltrated.  Hospitalist service was asked to admit the patient for further care and management.  Review of Systems: Cannot be assessed due to patient's altered mental status.  Past Medical History  Diagnosis Date  . Depression 04/26/2013  . Chronic anticoagulation 04/26/2013   History reviewed. No pertinent past surgical history. Family History  Problem Relation Age of Onset  . Family history unknown: Yes   History   Social History  . Marital Status: Single    Spouse Name: N/A    Number of Children: N/A  . Years of Education: N/A   Occupational History  . Not on file.   Social History Main Topics  . Smoking status: Unknown If Ever Smoked  . Smokeless tobacco: Not on file  . Alcohol Use: Not on file  . Drug Use: Not on file  . Sexual Activity: Not on file   Other Topics Concern  . Not on file   Social History Narrative  . No narrative on  file   Family and social history: Cannot be obtained due to patient's altered mental status.  Allergies: Review of patient's allergies indicates not on file.  Home Meds: Prior to Admission medications   Medication Sig Start Date End Date Taking? Authorizing Provider  bisacodyl (DULCOLAX) 5 MG EC tablet Take 10 mg by mouth daily. To be taken on an empty stomach   Yes Historical Provider, MD  Cholecalciferol (VITAMIN D) 2000 UNITS CAPS Take 2,000 Units by mouth daily.   Yes Historical Provider, MD  clonazePAM (KLONOPIN) 2 MG tablet Take 2 mg by mouth 3 (three) times daily.   Yes Historical Provider, MD  docusate sodium (COLACE) 100 MG capsule Take 100 mg by mouth 2 (two) times daily.   Yes Historical Provider, MD  fluticasone (FLONASE) 50 MCG/ACT nasal spray Place 1 spray into both nostrils daily.   Yes Historical Provider, MD  gabapentin (NEURONTIN) 600 MG tablet Take 1,200 mg by mouth 3 (three) times daily.   Yes Historical Provider, MD  Liniments (ANALGESIC BALM EX) Apply 1 application topically 4 (four) times daily as needed (for pain).   Yes Historical Provider, MD  magnesium oxide (MAG-OX) 400 MG tablet Take 400 mg by mouth 3 (three) times daily.   Yes Historical Provider, MD  methocarbamol (ROBAXIN) 750 MG tablet Take 750 mg by mouth 3 (three) times daily.   Yes Historical Provider, MD  OLANZapine (ZYPREXA) 10 MG tablet Take 10 mg by mouth at bedtime.  Yes Historical Provider, MD  omeprazole (PRILOSEC) 20 MG capsule Take 40 mg by mouth daily.   Yes Historical Provider, MD  ondansetron (ZOFRAN) 4 MG tablet Take 8 mg by mouth 3 (three) times daily as needed for nausea or vomiting.   Yes Historical Provider, MD  propranolol (INDERAL) 40 MG tablet Take 40 mg by mouth 3 (three) times daily.   Yes Historical Provider, MD  Rivaroxaban (XARELTO) 20 MG TABS tablet Take 20 mg by mouth daily with supper.   Yes Historical Provider, MD  traZODone (DESYREL) 100 MG tablet Take 200 mg by mouth at  bedtime.   Yes Historical Provider, MD  vitamin B-12 (CYANOCOBALAMIN) 1000 MCG tablet Take 1,000 mcg by mouth daily.   Yes Historical Provider, MD    Physical Exam: Blood pressure 99/56, pulse 76, temperature 96.3 F (35.7 C), temperature source Rectal, resp. rate 16, SpO2 99.00%. General: Somnolent, arousable to pain, No acute distress. HEENT: EOMI, Moist mucous membranes Neck: Supple CV: S1 and S2 Lungs: Clear to ascultation bilaterally Abdomen: Soft, Nontender, Nondistended, +bowel sounds. Ext: Good pulses. Trace edema. No clubbing or cyanosis noted.  Hematoma/infiltration over the right shin. Neuro: Moved all of her extremities spontaneously.  Cannot be assessed due to patient's compliance.  Lab results:  Recent Labs  04/26/13 2131  NA 142  K 3.9  CL 104  CO2 27  GLUCOSE 94  BUN 18  CREATININE 0.72  CALCIUM 9.0    Recent Labs  04/26/13 2131  AST 22  ALT 17  ALKPHOS 96  BILITOT 0.2*  PROT 6.5  ALBUMIN 3.4*   No results found for this basename: LIPASE, AMYLASE,  in the last 72 hours  Recent Labs  04/26/13 2131  WBC 2.6*  NEUTROABS 1.1*  HGB 10.2*  HCT 32.1*  MCV 87.0  PLT 142*    Recent Labs  04/26/13 2131  TROPONINI <0.30   No components found with this basename: POCBNP,  No results found for this basename: DDIMER,  in the last 72 hours No results found for this basename: HGBA1C,  in the last 72 hours No results found for this basename: CHOL, HDL, LDLCALC, TRIG, CHOLHDL, LDLDIRECT,  in the last 72 hours No results found for this basename: TSH, T4TOTAL, FREET3, T3FREE, THYROIDAB,  in the last 72 hours No results found for this basename: VITAMINB12, FOLATE, FERRITIN, TIBC, IRON, RETICCTPCT,  in the last 72 hours Imaging results:  Ct Head Wo Contrast  04/26/2013   CLINICAL DATA:  Altered mental status  EXAM: CT HEAD WITHOUT CONTRAST  TECHNIQUE: Contiguous axial images were obtained from the base of the skull through the vertex without intravenous  contrast.  COMPARISON:  None.  FINDINGS: No CT evidence of acute infarction. No intraparenchymal hemorrhage, mass, mass effect, or abnormal extra-axial fluid collection. The ventricles, cisterns, and sulci are normal in size, shape, and position. The visualized paranasal sinuses and mastoid air cells are predominantly clear. No displaced calvarial fracture.  IMPRESSION: No CT evidence of an acute intracranial abnormality.  Recommend MRI if concern for acute intracranial pathology persists.   Electronically Signed   By: Carlos Levering M.D.   On: 04/26/2013 22:51   Dg Chest Portable 1 View  04/26/2013   CLINICAL DATA:  Altered mental status.  EXAM: PORTABLE CHEST - 1 VIEW  COMPARISON:  None available for comparison at time of study interpretation.  FINDINGS: Cardiomediastinal silhouette is unremarkable for this low inspiratory portable examination with crowded vasculature markings. Mild bibasilar strandy densities. The lungs  are otherwise clear without pleural effusions or focal consolidations. Trachea projects midline and there is no pneumothorax. Included soft tissue planes and osseous structures are non-suspicious. Multiple EKG lines overlie the patient and may obscure subtle underlying pathology.  IMPRESSION: Mild bibasilar atelectasis in this low inspiratory portable examination.   Electronically Signed   By: Elon Alas   On: 04/26/2013 22:20   Other results: EKG: Sinus rhythm.  Assessment & Plan by Problem: Altered mental status/acute encephalopathy Unclear etiology.  Urine drug screen was negative, strong suspicion this could still be related to overdose.  Patient briefly arouses to stimuli and answers questions.  If mentation does not improve, consider further workup in the morning including getting a neurology consultation.  Head CT, chest x-ray, and urine analysis negative for infectious etiology.  Hold all home medications, reassess in the morning.  Patient currently is able to maintain  her airway.  Depression/anxiety As indicated above hold all patient's home medications.  Pancytopenia Etiology unclear.  Check hepatitis panel and HIV.  Right shin hematoma/IO infiltration Continue to monitor.  Chronic anticoagulation Unclear for why patient takes Rivaroxaban. Hold for now due to shin hematoma.  Prophylaxis SCDs for now.  Resume home Rivaroxaban in the morning once mentation improves.  CODE STATUS Full code  Disposition Admit the patient as inpatient to step down.  Time spent on admission, talking to the patient, and coordinating care was: 50 mins.  Bryn Saline A, MD 04/27/2013, 12:05 AM

## 2013-04-27 NOTE — ED Notes (Signed)
Pt is awake talking to MD, pt wanting to get up. Then pt will talk about lions and bears. ina

## 2013-04-27 NOTE — Progress Notes (Signed)
Pt pulled off new dressing on central line. Cleansed are with chloraprep and applied another new dressing. Pt states that she is going to pull out central line. Pt educated about risks associated with pulling out the line herself; states she doesn't care about risks and that it is "going to come out." Dr. Reece Levy notified; did not want to medicate at this time given her current mental status; told if we could find a peripheral site the central line could come out. Pt is requesting a PICC line; states this is what she normally gets when she goes to the hospital. Cannot locate a peripheral at this time as patient is uncooperative and will not stay in position. Will have another RN check. Given verbal order to restrain pt again if necessary.  Will continue to monitor closely.

## 2013-04-27 NOTE — Progress Notes (Signed)
The patient is a 43 year old woman who was admitted this morning by Dr. Reece Levy for altered mental status. She was briefly seen and examined. Her chart, vitals signs, and laboratory studies were reviewed. The patient is now more alert. Here are the updated findings:  #1. The patient reports that she will have episodes of unresponsiveness which she attributes to hypoglycemia. 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 She is treated chronically with glucagon injections.  #2. Her endocrinologist/surgeon Dr. Barb Merino at [facility] plans on some type of pancreatic surgery in the near future.  #3. The patient has a history of pulmonary emboli and DVTs. She is on lifelong anticoagulation. Lovenox was recently discontinued in favor of Xarelto.   #4. 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 She had been treated with methadone in the past by a pain clinic, but this has been discontinued. She is pending a new appointment with another pain clinic in [place], [place]. She is treated chronically with OxyContin and Flexeril. She has not taken either one of these medications in several days.  #5. She has chronic anxiety and depression. She takes Klonopin and trazodone chronically.  #6. She has chronic nausea for which she takes Zofran chronically.  #7. She has chronic insomnia for which she takes trazodone and Ambien chronically.    Plan: 1. Agree with Dr. Reddy's assessment and plan, with additions below. 2. We'll change IV fluids to D5 normal saline. We'll order when necessary glucagon for capillary blood glucose of less than 90. Will start regular diet. We'll check her capillary blood glucose before each Reglan at bedtime. 3. We'll discontinue right femoral central line and order a PICC line. 4. Will start low-dose  OxyContin and when necessary oxycodone, trazodone, Flexeril, and Klonopin. 5. We'll order when necessary sublingual Zofran. 6. For workup of pancytopenia, HIV antibody and hepatitis panel has been ordered. We'll add TSH and vitamin B12. 7. Restart Xarelto.  Disposition: Anticipate discharge within the next 24-48 hours.

## 2013-04-27 NOTE — ED Notes (Signed)
Consent signed by MD and Nurse, Pt unable to sign, Central line cart set up, Time out complete, lab at the bedside

## 2013-04-27 NOTE — Progress Notes (Signed)
Pt ripped dressing off femoral line. Cleansed area with chloraprep and placed new dressing.

## 2013-04-27 NOTE — ED Notes (Signed)
Transported pt to ICU, pt taking, promises to not get up and pull on lines, ICU RN aware pt has been restrained. Pt moved into bed, wiped clean, pt is more calm. Out of restraint, not redness or injury noted to wrist or ankles.

## 2013-04-28 ENCOUNTER — Encounter (HOSPITAL_COMMUNITY): Payer: Self-pay | Admitting: *Deleted

## 2013-04-28 ENCOUNTER — Inpatient Hospital Stay (HOSPITAL_COMMUNITY)
Admit: 2013-04-28 | Discharge: 2013-04-28 | Disposition: A | Discharge status: Home / Self Care | Payer: Medicaid Other | Attending: Internal Medicine | Admitting: Internal Medicine

## 2013-04-28 DIAGNOSIS — R4182 Altered mental status, unspecified: Secondary | ICD-10-CM

## 2013-04-28 LAB — CBC
HEMATOCRIT: 30.8 % — AB (ref 36.0–46.0)
Hemoglobin: 10.1 g/dL — ABNORMAL LOW (ref 12.0–15.0)
MCH: 28.8 pg (ref 26.0–34.0)
MCHC: 32.8 g/dL (ref 30.0–36.0)
MCV: 87.7 fL (ref 78.0–100.0)
PLATELETS: 127 10*3/uL — AB (ref 150–400)
RBC: 3.51 MIL/uL — ABNORMAL LOW (ref 3.87–5.11)
RDW: 13.7 % (ref 11.5–15.5)
WBC: 2.9 10*3/uL — ABNORMAL LOW (ref 4.0–10.5)

## 2013-04-28 LAB — BASIC METABOLIC PANEL
BUN: 15 mg/dL (ref 6–23)
CALCIUM: 9 mg/dL (ref 8.4–10.5)
CHLORIDE: 108 meq/L (ref 96–112)
CO2: 29 meq/L (ref 19–32)
CREATININE: 0.69 mg/dL (ref 0.50–1.10)
GFR calc Af Amer: >90 mL/min (ref >90)
GFR calc non Af Amer: >90 mL/min (ref >90)
Glucose, Bld: 91 mg/dL (ref 70–99)
Potassium: 4 mEq/L (ref 3.7–5.3)
Sodium: 145 mEq/L (ref 137–147)

## 2013-04-28 LAB — GLUCOSE, CAPILLARY
GLUCOSE-CAPILLARY: 79 mg/dL (ref 70–99)
Glucose-Capillary: 90 mg/dL (ref 70–99)

## 2013-04-28 MED ORDER — HYDROXYZINE HCL 25 MG PO TABS
25.0000 mg | ORAL_TABLET | Freq: Three times a day (TID) | ORAL | Status: DC | PRN
  Administered 2013-04-28 (×2): 25 mg via ORAL
  Filled 2013-04-28 (×2): qty 1

## 2013-04-28 NOTE — Progress Notes (Addendum)
Inpatient Diabetes Program Recommendations  AACE/ADA: New Consensus Statement on Inpatient Glycemic Control (2013)  Target Ranges:  Prepandial:   less than 140 mg/dL      Peak postprandial:   less than 180 mg/dL (1-2 hours)      Critically ill patients:  140 - 180 mg/dL   Results for Leslie Marks, Leslie Marks (MRN [id number]) as of 04/28/2013 11:03  Ref. Range 04/27/2013 11:25 04/27/2013 16:39 04/27/2013 22:58  Glucose-Capillary Latest Range: 70-99 mg/dL 104 (H) 44 (LL) 154 (H)   Diabetes history: No history of diabetes but does have a documented history of hypoglycemia Outpatient Diabetes medications: NA Current orders for Inpatient glycemic control: None  Inpatient Diabetes Program Recommendations Monitoring: Please consider ordering CBGs Q4H to ensure blood glucose remains stable while inpatient.  Thanks, Barnie Alderman, RN, MSN, CCRN Diabetes Coordinator Inpatient Diabetes Program [phone] (Team Pager) [phone] ([facility] office) [phone] [facility] office)

## 2013-04-28 NOTE — Progress Notes (Signed)
[facility] PROGRESS NOTE  Leslie Marks [id number] DOB: Sep 18, 1970 DOA: 04/26/2013 PCP: No primary provider on file.    Assessment/Plan: Acute syncope/altered mental status Workup so far negative. Patient attributes her episodes of unresponsiveness stool PediOtic hypo-glycemia. She reports having pancreatic abnormalities following gastric bypass surgery in 2003 and has frequent episodes of hypoglycemia and treats herself with glucagon njections. -Head CT unremarkable. -Patient had an episode of low FSG on 3/10 afternoon. -stable in the ICU. -Will transfer to medical floor. -Was unable to reach her endocrinologist Dr Armando Reichert at [facility] despite multiple attepts today. Patient reports following up with her to have further workup done. We'll try to contact her again to gather more information regarding her illness. -Junior blood glucose monitoring.  Pancytopenia No previous labs in the system. TSH and vitamin B12 levels normal. Check peripheral smear. Will need outpatient hematology appointment.  History of DVT and PEs Patient is on chronic xarelto which she was getting filled from her rheumatologist in [place].   Chronic pain Patient reports being on [facility]adone [facility] (has not been there since she moved to [place] 2 months back and has not taken ay methadone)  Also reports taking oxycontin and ocycodone for chronic pain . ( Is following up at pain clinic in [place] until 6 weeks back and has left over medications.)  plans to establish care with pain clinic nearby. Started  on low dose when necessary oxycodone.  Code Status: Code Family Communication:none At bedside  Disposition Plan: home tomorrow if stable and after discussion with her endocrinologist   Consultants:  None  Procedures:  none  Antibiotics:  None  HPI/Subjective: Patient seen and examined this am. Had episode of low fsg yesterday afternoon. No overnight  issues  Objective: Filed Vitals:   04/28/13 1500  BP: 99/62  Pulse:   Temp:   Resp: 15    Intake/Output Summary (Last 24 hours) at 04/28/13 1841 Last data filed at 04/28/13 0600  Gross per 24 hour  Intake    720 ml  Output      0 ml  Net    720 ml   Filed Weights   04/27/13 0200 04/28/13 0500  Weight: 82.2 kg (181 lb 3.5 oz) 83.5 kg (184 lb 1.4 oz)    Exam:   General:  Middle aged female in no acute distress  HEENT: No pallor, moist oral mucosa  Chest: Clear to auscultation bilaterally, no added sounds  CVS: Normal S1 and S2, no murmurs rub or gallop  Abdomen: Soft, nontender, nondistended, bowel sounds present  Extremities: Warm, no edema  CNS: AAO x3    Data Reviewed: Basic Metabolic Panel:  Recent Labs Lab 04/26/13 2131 04/27/13 0213 04/28/13 0444  NA 142 144 145  K 3.9 4.0 4.0  CL 104 105 108  CO2 27 28 29   GLUCOSE 94 99 91  BUN 18 15 15   CREATININE 0.72 0.67 0.69  CALCIUM 9.0 9.4 9.0   Liver Function Tests:  Recent Labs Lab 04/26/13 2131  AST 22  ALT 17  ALKPHOS 96  BILITOT 0.2*  PROT 6.5  ALBUMIN 3.4*   No results found for this basename: LIPASE, AMYLASE,  in the last 168 hours No results found for this basename: AMMONIA,  in the last 168 hours CBC:  Recent Labs Lab 04/26/13 2131 04/27/13 0213 04/28/13 0444  WBC 2.6* 3.9* 2.9*  NEUTROABS 1.1*  --   --   HGB 10.2* 11.1* 10.1*  HCT 32.1* 34.0* 30.8*  MCV 87.0 87.0 87.7  PLT 142* 143* 127*   Cardiac Enzymes:  Recent Labs Lab 04/26/13 2131 04/27/13 0213 04/27/13 0821  TROPONINI <0.30 <0.30 <0.30   BNP (last 3 results) No results found for this basename: PROBNP,  in the last 8760 hours CBG:  Recent Labs Lab 04/27/13 1125 04/27/13 1639 04/27/13 2258 04/28/13 0750 04/28/13 1049  GLUCAP 104* 44* 154* 90 79    Recent Results (from the past 240 hour(s))  MRSA PCR SCREENING     Status: None   Collection Time    04/27/13  1:55 AM      Result Value Ref Range  Status   MRSA by PCR NEGATIVE  NEGATIVE Final   Comment:            The GeneXpert MRSA Assay (FDA     approved for NASAL specimens     only), is one component of a     comprehensive MRSA colonization     surveillance program. It is not     intended to diagnose MRSA     infection nor to guide or     monitor treatment for     MRSA infections.     Studies: Ct Head Wo Contrast  04/26/2013   CLINICAL DATA:  Altered mental status  EXAM: CT HEAD WITHOUT CONTRAST  TECHNIQUE: Contiguous axial images were obtained from the base of the skull through the vertex without intravenous contrast.  COMPARISON:  None.  FINDINGS: No CT evidence of acute infarction. No intraparenchymal hemorrhage, mass, mass effect, or abnormal extra-axial fluid collection. The ventricles, cisterns, and sulci are normal in size, shape, and position. The visualized paranasal sinuses and mastoid air cells are predominantly clear. No displaced calvarial fracture.  IMPRESSION: No CT evidence of an acute intracranial abnormality.  Recommend MRI if concern for acute intracranial pathology persists.   Electronically Signed   By: Carlos Levering M.D.   On: 04/26/2013 22:51   Dg Chest Port 1 View  04/27/2013   CLINICAL DATA Status post PICC line placement  EXAM PORTABLE CHEST - 1 VIEW  COMPARISON DG CHEST 1V PORT dated 04/26/2013  FINDINGS The patient has undergone placement of a PICC line via the right upper extremity. The tip of the catheter lies in the region of the midportion of the SVC. The lungs are borderline hypoinflated but clear. There is no pneumothorax or pleural effusion or infiltrate. The cardiopericardial silhouette is normal in size. The pulmonary vascularity is not engorged.  IMPRESSION There is no evidence of a postprocedure complication following placement of a right-sided PICC line.  SIGNATURE  Electronically Signed   By: David  Martinique   On: 04/27/2013 14:26   Dg Chest Portable 1 View  04/26/2013   CLINICAL DATA:  Altered  mental status.  EXAM: PORTABLE CHEST - 1 VIEW  COMPARISON:  None available for comparison at time of study interpretation.  FINDINGS: Cardiomediastinal silhouette is unremarkable for this low inspiratory portable examination with crowded vasculature markings. Mild bibasilar strandy densities. The lungs are otherwise clear without pleural effusions or focal consolidations. Trachea projects midline and there is no pneumothorax. Included soft tissue planes and osseous structures are non-suspicious. Multiple EKG lines overlie the patient and may obscure subtle underlying pathology.  IMPRESSION: Mild bibasilar atelectasis in this low inspiratory portable examination.   Electronically Signed   By: Elon Alas   On: 04/26/2013 22:20    Scheduled Meds: . clonazePAM  1 mg Oral TID  . OxyCODONE  20 mg  Oral Q12H  . pantoprazole  40 mg Oral Daily  . Rivaroxaban  20 mg Oral Q supper  . sodium chloride  10-40 mL Intracatheter Q12H  . sodium chloride  3 mL Intravenous Q12H  . traZODone  50 mg Oral QHS  . zolpidem  5 mg Oral QHS   Continuous Infusions: . dextrose 5 % and 0.9 % NaCl with KCl 20 mEq/L 60 mL/hr at 04/28/13 0845      Time spent: 25 minutes    Ryder Man  [facility] Pager [phone]. If 7PM-7AM, please contact night-coverage at [email or web address], password [facility] 04/28/2013, 6:41 PM  LOS: 2 days

## 2013-04-28 NOTE — Procedures (Signed)
Daisy A. Merlene Laughter, MD     [email or web address]           HISTORY: This is a 43 year old female who presents with recurrent episodes of encephalopathy/also mental status. This study did not evaluate for possible seizures.  MEDICATIONS: Scheduled Meds: . clonazePAM  1 mg Oral TID  . OxyCODONE  20 mg Oral Q12H  . pantoprazole  40 mg Oral Daily  . Rivaroxaban  20 mg Oral Q supper  . sodium chloride  10-40 mL Intracatheter Q12H  . sodium chloride  3 mL Intravenous Q12H  . traZODone  50 mg Oral QHS  . zolpidem  5 mg Oral QHS   Continuous Infusions: . dextrose 5 % and 0.9 % NaCl with KCl 20 mEq/L 60 mL/hr at 04/28/13 0845   PRN Meds:.acetaminophen, acetaminophen, albuterol, hydrOXYzine, ondansetron (ZOFRAN) IV, ondansetron, oxyCODONE, sodium chloride  Prior to Admission medications   Medication Sig Start Date End Date Taking? Authorizing Provider  bisacodyl (DULCOLAX) 5 MG EC tablet Take 10 mg by mouth daily. To be taken on an empty stomach   Yes Historical Provider, MD  Cholecalciferol (VITAMIN D) 2000 UNITS CAPS Take 2,000 Units by mouth daily.   Yes Historical Provider, MD  clonazePAM (KLONOPIN) 2 MG tablet Take 2 mg by mouth 3 (three) times daily.   Yes Historical Provider, MD  docusate sodium (COLACE) 100 MG capsule Take 100 mg by mouth 2 (two) times daily.   Yes Historical Provider, MD  fluticasone (FLONASE) 50 MCG/ACT nasal spray Place 1 spray into both nostrils daily.   Yes Historical Provider, MD  gabapentin (NEURONTIN) 600 MG tablet Take 1,200 mg by mouth 3 (three) times daily.   Yes Historical Provider, MD  Liniments (ANALGESIC BALM EX) Apply 1 application topically 4 (four) times daily as needed (for pain).   Yes Historical Provider, MD  magnesium oxide (MAG-OX) 400 MG tablet Take 400 mg by mouth 3 (three) times daily.   Yes Historical Provider, MD  methocarbamol (ROBAXIN) 750 MG tablet Take 750 mg by mouth 3 (three) times daily.   Yes Historical  Provider, MD  OLANZapine (ZYPREXA) 10 MG tablet Take 10 mg by mouth at bedtime.   Yes Historical Provider, MD  omeprazole (PRILOSEC) 20 MG capsule Take 40 mg by mouth daily.   Yes Historical Provider, MD  ondansetron (ZOFRAN) 4 MG tablet Take 8 mg by mouth 3 (three) times daily as needed for nausea or vomiting.   Yes Historical Provider, MD  propranolol (INDERAL) 40 MG tablet Take 40 mg by mouth 3 (three) times daily.   Yes Historical Provider, MD  Rivaroxaban (XARELTO) 20 MG TABS tablet Take 20 mg by mouth daily with supper.   Yes Historical Provider, MD  traZODone (DESYREL) 100 MG tablet Take 200 mg by mouth at bedtime.   Yes Historical Provider, MD  vitamin B-12 (CYANOCOBALAMIN) 1000 MCG tablet Take 1,000 mcg by mouth daily.   Yes Historical Provider, MD      ANALYSIS: A 16 channel recording using standard 10 20 measurements is conducted for 22 minutes. There is a well-formed Posterior dominant rhythm of 10 Hz which Attenuates with eye opening. There is beta activity is seen in the frontal areas. There are prominent spindles and K complexes observed. Photic simulation is carried out without abnormal changes in the background activity. The patient is noted to have a few sharp wave activities which phase reverses at T3. No electrographic seizures are observed.   IMPRESSION: 1. This recording of the awake  and sleep states is slightly abnormal. There are few epileptiform activity observed in the left temporal area.      Kailah Pennel A. Merlene Laughter, M.D.  Diplomate, Tax adviser of Psychiatry and Neurology ( Neurology).

## 2013-04-28 NOTE — Progress Notes (Signed)
EEG Completed; Results Pending  

## 2013-04-29 ENCOUNTER — Other Ambulatory Visit (HOSPITAL_COMMUNITY): Payer: Self-pay | Admitting: Oncology

## 2013-04-29 DIAGNOSIS — R55 Syncope and collapse: Secondary | ICD-10-CM

## 2013-04-29 DIAGNOSIS — D6859 Other primary thrombophilia: Secondary | ICD-10-CM

## 2013-04-29 DIAGNOSIS — D61818 Other pancytopenia: Secondary | ICD-10-CM

## 2013-04-29 DIAGNOSIS — Z86718 Personal history of other venous thrombosis and embolism: Secondary | ICD-10-CM

## 2013-04-29 DIAGNOSIS — R894 Abnormal immunological findings in specimens from other organs, systems and tissues: Secondary | ICD-10-CM

## 2013-04-29 DIAGNOSIS — R760 Raised antibody titer: Secondary | ICD-10-CM | POA: Diagnosis present

## 2013-04-29 LAB — PATHOLOGIST SMEAR REVIEW

## 2013-04-29 LAB — IRON AND TIBC
IRON: 25 ug/dL — AB (ref 42–135)
SATURATION RATIOS: 6 % — AB (ref 20–55)
TIBC: 396 ug/dL (ref 250–470)
UIBC: 371 ug/dL (ref 125–400)

## 2013-04-29 LAB — CBC
HCT: 32.4 % — ABNORMAL LOW (ref 36.0–46.0)
HEMOGLOBIN: 10.5 g/dL — AB (ref 12.0–15.0)
MCH: 28.5 pg (ref 26.0–34.0)
MCHC: 32.4 g/dL (ref 30.0–36.0)
MCV: 87.8 fL (ref 78.0–100.0)
Platelets: 120 10*3/uL — ABNORMAL LOW (ref 150–400)
RBC: 3.69 MIL/uL — AB (ref 3.87–5.11)
RDW: 13.7 % (ref 11.5–15.5)
WBC: 2.5 10*3/uL — AB (ref 4.0–10.5)

## 2013-04-29 LAB — GLUCOSE, CAPILLARY: GLUCOSE-CAPILLARY: 104 mg/dL — AB (ref 70–99)

## 2013-04-29 LAB — FERRITIN: Ferritin: 5 ng/mL — ABNORMAL LOW (ref 10–291)

## 2013-04-29 MED ORDER — HYDROXYZINE HCL 25 MG PO TABS: 25.0000 mg | ORAL_TABLET | Freq: Three times a day (TID) | ORAL | Status: DC | PRN

## 2013-04-29 MED ORDER — OXYCODONE HCL ER 20 MG PO T12A: 20.0000 mg | EXTENDED_RELEASE_TABLET | Freq: Two times a day (BID) | ORAL | Status: DC

## 2013-04-29 MED ORDER — OXYCODONE HCL 5 MG PO TABS: 5.0000 mg | ORAL_TABLET | Freq: Four times a day (QID) | ORAL | Status: DC | PRN

## 2013-04-29 NOTE — Care Management Note (Signed)
UR completed 

## 2013-04-29 NOTE — Discharge Summary (Addendum)
Physician Discharge Summary  Leslie Marks TMH:[id number] DOB: 1970/03/02 DOA: 04/26/2013  PCP: No primary provider on file.  Admit date: 04/26/2013 Discharge date: 04/29/2013  Time spent: 40 minutes  Recommendations for Outpatient Follow-up:  1. Home with outpt hematology follow up in 2 weeks 2. Patient provided with list of PCP in the community. She also is looking to establish care at pain clinic in the locality. Until then she will follow at the pain clinic in [place] where she has been following previously. 3. Patient has been getting prescriptions of xarelto from her rheumatologists office in [place].  Discharge Diagnoses:  Principal Problem:   Acute encephalopathy  Active Problems:   Pancytopenia   Chronic pain syndrome   Non-diabetic hypoglycemia   Depression   Chronic anticoagulation   Altered mental status   Pulmonary emboli   Chronic anxiety   Lupus anticoagulant positive   Abdominal wall fistula   Discharge Condition: fair  Diet recommendation: regular  Filed Weights   04/27/13 0200 04/28/13 0500  Weight: 82.2 kg (181 lb 3.5 oz) 83.5 kg (184 lb 1.4 oz)    History of present illness:  43 y.o. Caucasian female with hx of chronic pain ( reports being on methadone and oxycodone at pain clinic in [place] )  and chronic anticoagulation on Rivaroxaban for lupus diagnosed over 10 year back, hx of gastric bypass and pancreatic insufficiency with recurrent hypoglycemic events ( follows with Dr Armando Reichert at [facility]) presented with acute syncopal event.   Patient was found at [place] at the checkout kiosk slumped. EMS was called, there was question of incontinence. Patient was confused and had non-voluntary movements. Patient received 2 doses of Narcan first by EMS and emergency department with questionable response. Due to lack of IV access, an IO was placed on her right shin, however due to IV infiltration/possible hematoma it was removed. On evaluation by the hospitalist,   patient would spontaneously open her eyes to stimuli and would answer to some simple questions, like she was in the hospital. Attempted to give one dose of Flumazenil, patient may not have received adequate dose as her IO had infiltrated. Hospitalist service was asked to admit the patient for further care and management.  Hospital Course:  Acute syncope/altered mental status  Workup so far negative. Patient attributes her episodes of unresponsiveness to recurrent  hypo-glycemia. She reports having pancreatic abnormalities following gastric bypass surgery in 2003 and has frequent episodes of hypoglycemia and treats herself with glucagon injections.  -Head CT unremarkable. EEG showed no seizure activity. -Patient had an episode of low FSG on 3/10 afternoon but remaining levels were stable..  -I Was unable to reach her endocrinologist Dr Armando Reichert at [facility] despite multiple attepts for 2 days she has been in the hospital. Patient reports following up with her to have further workup done.  -patient advised on safety regarding her hypoglycemic symptoms and to avoid traveling on her own and abstain from driving.  Pancytopenia  No previous labs in the system. TSH and vitamin B12 levels normal. Denies being aware of low blood counts. Iron panel pending. Pending peripheral smear. Will need outpatient hematology appointment. Seen by hematology consult and plan for outpt visit in 2 weeks.  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   Chronic pain  Patient reports being on methadone at methadone clinic in [place] (has not been there since she moved to [place] 2  months back and has not taken ay methadone) Also reports taking oxycontin and ocycodone for chronic pain . ( Is following up at pain clinic in [place] until 6 weeks back and has left over medications.) plans to establish care with pain clinic  nearby.  Given low dose when necessary oxycodone and will prescribe her meds for 3-4 days only. i have instructed her to get medications refilled in [place] until she is able to establish care here.    Abdominal skin fistula Reports to be present for several months with plan on surgery at [place] in few months  Patient stable for discharge homer with outpt follow up. CM provided her with list of PCP in the community.   Code Status: full Code  Family Communication: none At bedside  Disposition Plan: home  Consultants:  Hematology  Procedures:  none Antibiotics:  None   Discharge Exam: Filed Vitals:   04/29/13 1423  BP: 109/71  Pulse: 121  Temp: 97.9 F (36.6 C)  Resp: 20   General: Middle aged female in no acute distress  HEENT: No pallor, moist oral mucosa  Chest: Clear to auscultation bilaterally, no added sounds  CVS: Normal S1 and S2, no murmurs rub or gallop  Abdomen: Soft, nontender, nondistended, bowel sounds present , abdominal skin fistula ( appears non infected) Extremities: Warm, no edema  CNS: AAO x3    Discharge Instructions   Future Appointments Provider Department Dept Phone   05/12/2013 2:00 PM [facility] Covering Provider [place] [phone]       Medication List         ANALGESIC BALM EX  Apply 1 application topically 4 (four) times daily as needed (for pain).     bisacodyl 5 MG EC tablet  Commonly known as:  DULCOLAX  Take 10 mg by mouth daily. To be taken on an empty stomach     clonazePAM 2 MG tablet  Commonly known as:  KLONOPIN  Take 2 mg by mouth 3 (three) times daily.     docusate sodium 100 MG capsule  Commonly known as:  COLACE  Take 100 mg by mouth 2 (two) times daily.     fluticasone 50 MCG/ACT nasal spray  Commonly known as:  FLONASE  Place 1 spray into both nostrils daily.     gabapentin 600 MG tablet  Commonly known as:  NEURONTIN  Take 1,200 mg by mouth 3 (three) times daily.     hydrOXYzine 25 MG  tablet  Commonly known as:  ATARAX/VISTARIL  Take 1 tablet (25 mg total) by mouth 3 (three) times daily as needed for anxiety or itching.     magnesium oxide 400 MG tablet  Commonly known as:  MAG-OX  Take 400 mg by mouth 3 (three) times daily.     methocarbamol 750 MG tablet  Commonly known as:  ROBAXIN  Take 750 mg by mouth 3 (three) times daily.     OLANZapine 10 MG tablet  Commonly known as:  ZYPREXA  Take 10 mg by mouth at bedtime.     omeprazole 20 MG capsule  Commonly known as:  PRILOSEC  Take 40 mg by mouth daily.     ondansetron 4 MG tablet  Commonly known as:  ZOFRAN  Take 8 mg by mouth 3 (three) times daily as needed for nausea or vomiting.     OxyCODONE 20 mg T12a 12 hr tablet  Commonly known as:  OXYCONTIN  Take 1 tablet (20 mg total) by mouth every  12 (twelve) hours.     oxyCODONE 5 MG immediate release tablet  Commonly known as:  Oxy IR/ROXICODONE  Take 1 tablet (5 mg total) by mouth every 6 (six) hours as needed for moderate pain.     propranolol 40 MG tablet  Commonly known as:  INDERAL  Take 40 mg by mouth 3 (three) times daily.     traZODone 100 MG tablet  Commonly known as:  DESYREL  Take 200 mg by mouth at bedtime.     vitamin B-12 1000 MCG tablet  Commonly known as:  CYANOCOBALAMIN  Take 1,000 mcg by mouth daily.     Vitamin D 2000 UNITS Caps  Take 2,000 Units by mouth daily.     XARELTO 20 MG Tabs tablet  Generic drug:  Rivaroxaban  Take 20 mg by mouth daily with supper.       Allergies  Allergen Reactions  . Amoxicillin Anaphylaxis  . Augmentin [Amoxicillin-Pot Clavulanate] Anaphylaxis  . Betadine [Povidone Iodine] Anaphylaxis  . Ciprofloxacin Anaphylaxis  . Erythromycin Anaphylaxis  . Latex Anaphylaxis  . Penicillins Anaphylaxis  . Adhesive [Tape]        Follow-up Information   Follow up with [facility] On 05/12/2013. (appt with Dr. Barnet Glasgow)    Contact information:   [address] [place]  [phone]       Follow up with ROWELL, Alberteen Spindle, MD In 6 weeks. (has appt)    Specialty:  Endocrinology   Contact information:   [place] [address] [phone]        The results of significant diagnostics from this hospitalization (including imaging, microbiology, ancillary and laboratory) are listed below for reference.    Significant Diagnostic Studies: Ct Head Wo Contrast  04/26/2013   CLINICAL DATA:  Altered mental status  EXAM: CT HEAD WITHOUT CONTRAST  TECHNIQUE: Contiguous axial images were obtained from the base of the skull through the vertex without intravenous contrast.  COMPARISON:  None.  FINDINGS: No CT evidence of acute infarction. No intraparenchymal hemorrhage, mass, mass effect, or abnormal extra-axial fluid collection. The ventricles, cisterns, and sulci are normal in size, shape, and position. The visualized paranasal sinuses and mastoid air cells are predominantly clear. No displaced calvarial fracture.  IMPRESSION: No CT evidence of an acute intracranial abnormality.  Recommend MRI if concern for acute intracranial pathology persists.   Electronically Signed   By: Carlos Levering M.D.   On: 04/26/2013 22:51   Dg Chest Port 1 View  04/27/2013   CLINICAL DATA Status post PICC line placement  EXAM PORTABLE CHEST - 1 VIEW  COMPARISON DG CHEST 1V PORT dated 04/26/2013  FINDINGS The patient has undergone placement of a PICC line via the right upper extremity. The tip of the catheter lies in the region of the midportion of the SVC. The lungs are borderline hypoinflated but clear. There is no pneumothorax or pleural effusion or infiltrate. The cardiopericardial silhouette is normal in size. The pulmonary vascularity is not engorged.  IMPRESSION There is no evidence of a postprocedure complication following placement of a right-sided PICC line.  SIGNATURE  Electronically Signed   By: David  Martinique   On: 04/27/2013 14:26   Dg Chest Portable 1  View  04/26/2013   CLINICAL DATA:  Altered mental status.  EXAM: PORTABLE CHEST - 1 VIEW  COMPARISON:  None available for comparison at time of study interpretation.  FINDINGS: Cardiomediastinal silhouette is unremarkable for this low inspiratory  portable examination with crowded vasculature markings. Mild bibasilar strandy densities. The lungs are otherwise clear without pleural effusions or focal consolidations. Trachea projects midline and there is no pneumothorax. Included soft tissue planes and osseous structures are non-suspicious. Multiple EKG lines overlie the patient and may obscure subtle underlying pathology.  IMPRESSION: Mild bibasilar atelectasis in this low inspiratory portable examination.   Electronically Signed   By: Elon Alas   On: 04/26/2013 22:20    Microbiology: Recent Results (from the past 240 hour(s))  MRSA PCR SCREENING     Status: None   Collection Time    04/27/13  1:55 AM      Result Value Ref Range Status   MRSA by PCR NEGATIVE  NEGATIVE Final   Comment:            The GeneXpert MRSA Assay (FDA     approved for NASAL specimens     only), is one component of a     comprehensive MRSA colonization     surveillance program. It is not     intended to diagnose MRSA     infection nor to guide or     monitor treatment for     MRSA infections.     Labs: Basic Metabolic Panel:  Recent Labs Lab 04/26/13 2131 04/27/13 0213 04/28/13 0444  NA 142 144 145  K 3.9 4.0 4.0  CL 104 105 108  CO2 27 28 29   GLUCOSE 94 99 91  BUN 18 15 15   CREATININE 0.72 0.67 0.69  CALCIUM 9.0 9.4 9.0   Liver Function Tests:  Recent Labs Lab 04/26/13 2131  AST 22  ALT 17  ALKPHOS 96  BILITOT 0.2*  PROT 6.5  ALBUMIN 3.4*   No results found for this basename: LIPASE, AMYLASE,  in the last 168 hours No results found for this basename: AMMONIA,  in the last 168 hours CBC:  Recent Labs Lab 04/26/13 2131 04/27/13 0213 04/28/13 0444 04/29/13 0905  WBC 2.6* 3.9*  2.9* 2.5*  NEUTROABS 1.1*  --   --   --   HGB 10.2* 11.1* 10.1* 10.5*  HCT 32.1* 34.0* 30.8* 32.4*  MCV 87.0 87.0 87.7 87.8  PLT 142* 143* 127* 120*   Cardiac Enzymes:  Recent Labs Lab 04/26/13 2131 04/27/13 0213 04/27/13 0821  TROPONINI <0.30 <0.30 <0.30   BNP: BNP (last 3 results) No results found for this basename: PROBNP,  in the last 8760 hours CBG:  Recent Labs Lab 04/27/13 1639 04/27/13 2258 04/28/13 0750 04/28/13 1049 04/28/13 1637  GLUCAP 44* 154* 90 79 104*       Signed:  Llewellyn Choplin  [facility] 04/29/2013, 3:58 PM

## 2013-04-29 NOTE — Consult Note (Addendum)
[facility] Consultation Oncology  Name: Leslie Marks      MRN: [id number]    Location: A302/A302-01  Date: 04/29/2013 Time:3:49 PM   REFERRING PHYSICIAN:  Louellen Molder, MD  REASON FOR CONSULT:  Pancytopenia   DIAGNOSIS:  Pancytopenia  HISTORY OF PRESENT ILLNESS:   Leslie Marks is a 43 year old Caucasian female who presented to the ED after patient was found at [employer] at the checkout kiosk slumped. EMS was called, there was question of incontinence. Patient was confused and had non-voluntary movements. Patient received 2 doses of Narcan first by EMS and emergency department with questionable response. Due to lack of IV access, an IO was placed on her right shin, however due to IV infiltration/possible hematoma it was removed.  She has a past medical history significant for gastric bypass, lupus anticoagulant positivity, chronic anticoagulation (now on Xarelto), history of VTE, S/P IVC filter placement in 2008, family history of blood clots with family members passing away secondary to VTE, "enlarged" pancreas, history of 6 unit PRBC transfusion following premature birth of her son (at 60 week).  She has received her care at [facility].  She notes that she delivered her son there, her rheumatologist is at [facility], she had a hematologist at [facility] during her pregnancy.  Additionally, her endocrinologist is at [facility].  She now lives in [place] and would like to have her hematology care at [facility].  She does not know her hematologist name but she reports that she will get that information and call our clinic.    She denies any history of Hepatitis.  She denies illicit drug abuse.    Her hematologic history is significant for family history of blood clots with associated deaths, personal history of VTEs with an IVC filter placed in 2008, and a self reported positive lupus anticoagulant.  She was on Lovenox x 3 years and was recently switched to Xarelto.   She denies any bleeding issues.  She denies any blood in her stools, black tarry stools, hemoptysis, epistaxis, and headaches.  She notes that she has been told in the past that she has "low blood counts."  She reports that she has been told that for years.   With regards to her pancreas, she notes that she is to undergo pancreatic surgical intervention for an "enlarged pancreas" that occurred secondary to gastric bypass surgery.    The patient is due for discharge from the [facility] in minutes and therefore hematology work-up will occur as an outpatient.  We recommend continuation of Xarelto lifelong.   PAST MEDICAL HISTORY:   Past Medical History  Diagnosis Date  . Depression   . Chronic anticoagulation   . Pulmonary emboli 2007  . Chronic pain syndrome   . Chronic anxiety   . Non-diabetic hypoglycemia     ALLERGIES: Allergies  Allergen Reactions  . Amoxicillin Anaphylaxis  . Augmentin [Amoxicillin-Pot Clavulanate] Anaphylaxis  . Betadine [Povidone Iodine] Anaphylaxis  . Ciprofloxacin Anaphylaxis  . Erythromycin Anaphylaxis  . Latex Anaphylaxis  . Penicillins Anaphylaxis  . Adhesive [Tape]       MEDICATIONS: I have reviewed the patient's current medications.     PAST SURGICAL HISTORY Past Surgical History  Procedure Laterality Date  . Gastric bypass  2003    FAMILY HISTORY: History reviewed. No pertinent family history.  SOCIAL HISTORY:  reports that she has never smoked. She does not have any smokeless tobacco history on file. Her alcohol and drug histories are  not on file.  PERFORMANCE STATUS: The patient's performance status is 1 - Symptomatic but completely ambulatory  PHYSICAL EXAM: Most Recent Vital Signs: Blood pressure 109/71, pulse 121, temperature 97.9 F (36.6 C), temperature source Oral, resp. rate 20, height _0  (1.549 m), weight 184 lb 1.4 oz (83.5 kg), SpO2 98.00%. General appearance: alert, cooperative, appears stated age and no  distress Head: Normocephalic, without obvious abnormality, atraumatic Eyes: negative findings: lids and lashes normal, conjunctivae and sclerae normal and corneas clear Neck: no adenopathy, supple, symmetrical, trachea midline and thyroid not enlarged, symmetric, no tenderness/mass/nodules Back: symmetric, no curvature. ROM normal. No CVA tenderness. Lungs: clear to auscultation bilaterally Heart: regular rate and rhythm Abdomen: abnormal findings:  moderate tenderness in the LUQ and lower abdomen fistula Extremities: extremities normal, atraumatic, no cyanosis or edema Skin: Skin color, texture, turgor normal. No rashes or lesions Lymph nodes: no cervical, infraclavicular, and supraclavicular lymphadenopathy Neurologic: Grossly normal  LABORATORY DATA:  Results for orders placed during the hospital encounter of 04/26/13 (from the past 48 hour(s))  GLUCOSE, CAPILLARY     Status: Abnormal   Collection Time    04/27/13  4:39 PM      Result Value Ref Range   Glucose-Capillary 44 (*) 70 - 99 mg/dL   Comment 1 Documented in Chart     Comment 2 Notify RN    GLUCOSE, CAPILLARY     Status: Abnormal   Collection Time    04/27/13 10:58 PM      Result Value Ref Range   Glucose-Capillary 154 (*) 70 - 99 mg/dL  BASIC METABOLIC PANEL     Status: None   Collection Time    04/28/13  4:44 AM      Result Value Ref Range   Sodium 145  137 - 147 mEq/L   Potassium 4.0  3.7 - 5.3 mEq/L   Chloride 108  96 - 112 mEq/L   CO2 29  19 - 32 mEq/L   Glucose, Bld 91  70 - 99 mg/dL   BUN 15  6 - 23 mg/dL   Creatinine, Ser 0.69  0.50 - 1.10 mg/dL   Calcium 9.0  8.4 - 10.5 mg/dL   GFR calc non Af Amer >90  >90 mL/min   GFR calc Af Amer >90  >90 mL/min   Comment: (NOTE)     The eGFR has been calculated using the CKD EPI equation.     This calculation has not been validated in all clinical situations.     eGFR's persistently <90 mL/min signify possible Chronic Kidney     Disease.  CBC     Status: Abnormal    Collection Time    04/28/13  4:44 AM      Result Value Ref Range   WBC 2.9 (*) 4.0 - 10.5 K/uL   RBC 3.51 (*) 3.87 - 5.11 MIL/uL   Hemoglobin 10.1 (*) 12.0 - 15.0 g/dL   HCT 30.8 (*) 36.0 - 46.0 %   MCV 87.7  78.0 - 100.0 fL   MCH 28.8  26.0 - 34.0 pg   MCHC 32.8  30.0 - 36.0 g/dL   RDW 13.7  11.5 - 15.5 %   Platelets 127 (*) 150 - 400 K/uL  PATHOLOGIST SMEAR REVIEW     Status: None   Collection Time    04/28/13  5:30 AM      Result Value Ref Range   Path Review Reviewed By Violet Baldy, M.D.     Comment:  [facility] [facility]  GLUCOSE, CAPILLARY     Status: None   Collection Time    04/28/13  7:50 AM      Result Value Ref Range   Glucose-Capillary 90  70 - 99 mg/dL  GLUCOSE, CAPILLARY     Status: None   Collection Time    04/28/13 10:49 AM      Result Value Ref Range   Glucose-Capillary 79  70 - 99 mg/dL  GLUCOSE, CAPILLARY     Status: Abnormal   Collection Time    04/28/13  4:37 PM      Result Value Ref Range   Glucose-Capillary 104 (*) 70 - 99 mg/dL  CBC     Status: Abnormal   Collection Time    04/29/13  9:05 AM      Result Value Ref Range   WBC 2.5 (*) 4.0 - 10.5 K/uL   RBC 3.69 (*) 3.87 - 5.11 MIL/uL   Hemoglobin 10.5 (*) 12.0 - 15.0 g/dL   HCT 32.4 (*) 36.0 - 46.0 %   MCV 87.8  78.0 - 100.0 fL   MCH 28.5  26.0 - 34.0 pg   MCHC 32.4  30.0 - 36.0 g/dL   RDW 13.7  11.5 - 15.5 %   Platelets 120 (*) 150 - 400 K/uL   Comment: CONSISTENT WITH PREVIOUS RESULT      RADIOGRAPHY: No results found.     PATHOLOGY:  None  ASSESSMENT:  1. Pancytopenia, etiology unknown.  Lack of records to provide length of this sign.  Pancytopenia is stable during course of hospitalization. Probably a result of collagen vascular disorder to be determined. 2. Lupus anticoagulant, patient reported.  On chronic anticoagulation 3. 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  S/P IVC filter in 2008 4. LUQ discomfort on deep  palpation 5. S/P gastric bypass surgery in past 6. "Enlarged pancreas" requiring surgical intervention 7. Abnormal pancreatic responses  8. Acute syncopal episode, leading to present hospitalization   PLAN:  1. I personally reviewed and went over laboratory results with the patient.  The results are noted within this dictation. 2. I personally reviewed and went over radiographic studies with the patient.  The results are noted within this dictation.   3. Chart reviewed 4. Due to patient's impending discharge, outpatient hematologic work-up will take place. 5. Patient will call the clinic with hematologist name so we can recruit records. 6. Outpatient [place] of ab[place] focusing on liver and spleen. 7. Outpatient appointment at the [facility] on 05/12/2013 at 2 PM. 8. Will attempt to review records in [place] if available. 9. Continue Xarelto and eat multiple small meals in an attempt to avoid potential hypoglycemia and/or dumping syndrome. Also it may pay to avoid drinking a lot of liquids with meals.   All questions were answered. The patient knows to call the clinic with any problems, questions or concerns. We can certainly see the patient much sooner if necessary.  Patient and plan discussed with Dr. Farrel Gobble and he is in agreement with the aforementioned.    KEFALAS,THOMAS

## 2013-04-29 NOTE — Care Management Note (Signed)
    Page 1 of 1   04/29/2013     3:25:04 PM   CARE MANAGEMENT NOTE 04/29/2013  Patient:  Leslie Marks, Leslie Marks   Account Number:  [id number]  Date Initiated:  04/29/2013  Documentation initiated by:  Vladimir Creeks  Subjective/Objective Assessment:   Admitted with AMS.She has just moved here in the past month and is moving in with a roommate, who hasbeen visiting her here and is picking her up today. She has no PCP, and requests info on MDs in the area who are taking new patients, and     Action/Plan:   other resources. Pt was given the ED/[facility].   Anticipated DC Date:  04/29/2013   Anticipated DC Plan:  [place]  CM consult      Choice offered to / List presented to:             Status of service:  Completed, signed off Medicare Important Message given?   (If response is "NO", the following Medicare IM given date fields will be blank) Date Medicare IM given:   Date Additional Medicare IM given:    Discharge Disposition:  HOME/SELF CARE  Per UR Regulation:    If discussed at Long Length of Stay Meetings, dates discussed:    Comments:

## 2013-04-29 NOTE — Discharge Instructions (Signed)

## 2013-05-06 ENCOUNTER — Ambulatory Visit (HOSPITAL_COMMUNITY): Payer: Medicaid Other

## 2013-05-10 ENCOUNTER — Ambulatory Visit (HOSPITAL_COMMUNITY)
Admission: RE | Admit: 2013-05-10 | Discharge: 2013-05-10 | Disposition: A | Discharge status: Home / Self Care | Payer: Medicaid Other | Source: Ambulatory Visit | Attending: Oncology | Admitting: Oncology

## 2013-05-10 DIAGNOSIS — D61818 Other pancytopenia: Secondary | ICD-10-CM | POA: Insufficient documentation

## 2013-05-10 DIAGNOSIS — Z9884 Bariatric surgery status: Secondary | ICD-10-CM | POA: Diagnosis not present

## 2013-05-10 DIAGNOSIS — R161 Splenomegaly, not elsewhere classified: Secondary | ICD-10-CM | POA: Insufficient documentation

## 2013-05-10 DIAGNOSIS — K8020 Calculus of gallbladder without cholecystitis without obstruction: Secondary | ICD-10-CM | POA: Diagnosis not present

## 2013-05-12 ENCOUNTER — Ambulatory Visit (HOSPITAL_COMMUNITY): Payer: Medicaid Other

## 2013-05-12 DIAGNOSIS — K8020 Calculus of gallbladder without cholecystitis without obstruction: Secondary | ICD-10-CM | POA: Insufficient documentation

## 2013-05-13 NOTE — Progress Notes (Signed)
This encounter was created in error - please disregard.

## 2013-05-19 ENCOUNTER — Emergency Department (HOSPITAL_COMMUNITY): Payer: Medicaid Other

## 2013-05-19 ENCOUNTER — Encounter (HOSPITAL_COMMUNITY): Payer: Self-pay | Admitting: Emergency Medicine

## 2013-05-19 ENCOUNTER — Emergency Department (HOSPITAL_COMMUNITY)
Admission: EM | Admit: 2013-05-19 | Discharge: 2013-05-19 | Disposition: A | Discharge status: Home / Self Care | Payer: Medicaid Other | Attending: Emergency Medicine | Admitting: Emergency Medicine

## 2013-05-19 DIAGNOSIS — G894 Chronic pain syndrome: Secondary | ICD-10-CM | POA: Insufficient documentation

## 2013-05-19 DIAGNOSIS — R109 Unspecified abdominal pain: Secondary | ICD-10-CM

## 2013-05-19 DIAGNOSIS — Z91040 Latex allergy status: Secondary | ICD-10-CM | POA: Insufficient documentation

## 2013-05-19 DIAGNOSIS — Z7901 Long term (current) use of anticoagulants: Secondary | ICD-10-CM | POA: Insufficient documentation

## 2013-05-19 DIAGNOSIS — R1084 Generalized abdominal pain: Secondary | ICD-10-CM | POA: Insufficient documentation

## 2013-05-19 DIAGNOSIS — R1110 Vomiting, unspecified: Secondary | ICD-10-CM

## 2013-05-19 DIAGNOSIS — Z9089 Acquired absence of other organs: Secondary | ICD-10-CM | POA: Insufficient documentation

## 2013-05-19 DIAGNOSIS — Z3202 Encounter for pregnancy test, result negative: Secondary | ICD-10-CM | POA: Insufficient documentation

## 2013-05-19 DIAGNOSIS — Z86711 Personal history of pulmonary embolism: Secondary | ICD-10-CM | POA: Insufficient documentation

## 2013-05-19 DIAGNOSIS — Z880 Allergy status to penicillin: Secondary | ICD-10-CM | POA: Insufficient documentation

## 2013-05-19 DIAGNOSIS — IMO0002 Reserved for concepts with insufficient information to code with codable children: Secondary | ICD-10-CM | POA: Insufficient documentation

## 2013-05-19 DIAGNOSIS — Z9884 Bariatric surgery status: Secondary | ICD-10-CM | POA: Insufficient documentation

## 2013-05-19 DIAGNOSIS — R197 Diarrhea, unspecified: Secondary | ICD-10-CM | POA: Insufficient documentation

## 2013-05-19 DIAGNOSIS — R112 Nausea with vomiting, unspecified: Secondary | ICD-10-CM | POA: Insufficient documentation

## 2013-05-19 DIAGNOSIS — G8929 Other chronic pain: Secondary | ICD-10-CM

## 2013-05-19 DIAGNOSIS — E162 Hypoglycemia, unspecified: Secondary | ICD-10-CM | POA: Insufficient documentation

## 2013-05-19 DIAGNOSIS — R000 Tachycardia, unspecified: Secondary | ICD-10-CM | POA: Insufficient documentation

## 2013-05-19 DIAGNOSIS — F3289 Other specified depressive episodes: Secondary | ICD-10-CM | POA: Insufficient documentation

## 2013-05-19 DIAGNOSIS — K632 Fistula of intestine: Secondary | ICD-10-CM | POA: Insufficient documentation

## 2013-05-19 DIAGNOSIS — F411 Generalized anxiety disorder: Secondary | ICD-10-CM | POA: Insufficient documentation

## 2013-05-19 DIAGNOSIS — Z9889 Other specified postprocedural states: Secondary | ICD-10-CM | POA: Insufficient documentation

## 2013-05-19 DIAGNOSIS — R52 Pain, unspecified: Secondary | ICD-10-CM

## 2013-05-19 DIAGNOSIS — Z79899 Other long term (current) drug therapy: Secondary | ICD-10-CM | POA: Insufficient documentation

## 2013-05-19 DIAGNOSIS — F329 Major depressive disorder, single episode, unspecified: Secondary | ICD-10-CM | POA: Insufficient documentation

## 2013-05-19 LAB — COMPREHENSIVE METABOLIC PANEL
ALK PHOS: 106 U/L (ref 39–117)
ALT: 10 U/L (ref 0–35)
AST: 12 U/L (ref 0–37)
Albumin: 3.4 g/dL — ABNORMAL LOW (ref 3.5–5.2)
BUN: 10 mg/dL (ref 6–23)
CO2: 28 mEq/L (ref 19–32)
CREATININE: 0.61 mg/dL (ref 0.50–1.10)
Calcium: 9.2 mg/dL (ref 8.4–10.5)
Chloride: 103 mEq/L (ref 96–112)
GFR calc Af Amer: >90 mL/min (ref >90)
GFR calc non Af Amer: >90 mL/min (ref >90)
GLUCOSE: 90 mg/dL (ref 70–99)
Potassium: 3.8 mEq/L (ref 3.7–5.3)
Sodium: 142 mEq/L (ref 137–147)
Total Bilirubin: 0.4 mg/dL (ref 0.3–1.2)
Total Protein: 7.3 g/dL (ref 6.0–8.3)

## 2013-05-19 LAB — CBC WITH DIFFERENTIAL/PLATELET
Basophils Absolute: 0 10*3/uL (ref 0.0–0.1)
Basophils Relative: 1 % (ref 0–1)
EOS ABS: 0.1 10*3/uL (ref 0.0–0.7)
EOS PCT: 2 % (ref 0–5)
HCT: 30.5 % — ABNORMAL LOW (ref 36.0–46.0)
HEMOGLOBIN: 9.3 g/dL — AB (ref 12.0–15.0)
Lymphocytes Relative: 16 % (ref 12–46)
Lymphs Abs: 0.7 10*3/uL (ref 0.7–4.0)
MCH: 26.6 pg (ref 26.0–34.0)
MCHC: 30.5 g/dL (ref 30.0–36.0)
MCV: 87.4 fL (ref 78.0–100.0)
MONO ABS: 0.4 10*3/uL (ref 0.1–1.0)
MONOS PCT: 10 % (ref 3–12)
Neutro Abs: 3.2 10*3/uL (ref 1.7–7.7)
Neutrophils Relative %: 72 % (ref 43–77)
Platelets: 170 10*3/uL (ref 150–400)
RBC: 3.49 MIL/uL — ABNORMAL LOW (ref 3.87–5.11)
RDW: 14.4 % (ref 11.5–15.5)
WBC: 4.4 10*3/uL (ref 4.0–10.5)

## 2013-05-19 LAB — URINALYSIS, ROUTINE W REFLEX MICROSCOPIC
BILIRUBIN URINE: NEGATIVE
Glucose, UA: NEGATIVE mg/dL
Hgb urine dipstick: NEGATIVE
Ketones, ur: NEGATIVE mg/dL
LEUKOCYTES UA: NEGATIVE
NITRITE: NEGATIVE
Protein, ur: NEGATIVE mg/dL
SPECIFIC GRAVITY, URINE: 1.02 (ref 1.005–1.030)
UROBILINOGEN UA: 0.2 mg/dL (ref 0.0–1.0)
pH: 6 (ref 5.0–8.0)

## 2013-05-19 LAB — LIPASE, BLOOD: LIPASE: 29 U/L (ref 11–59)

## 2013-05-19 LAB — I-STAT CG4 LACTIC ACID, ED: Lactic Acid, Venous: 2.16 mmol/L (ref 0.5–2.2)

## 2013-05-19 LAB — PREGNANCY, URINE: Preg Test, Ur: NEGATIVE

## 2013-05-19 MED ORDER — ONDANSETRON HCL 4 MG/2ML IJ SOLN
4.0000 mg | Freq: Once | INTRAMUSCULAR | Status: AC
  Administered 2013-05-19: 4 mg via INTRAVENOUS
  Filled 2013-05-19: qty 2

## 2013-05-19 MED ORDER — HYDROMORPHONE HCL PF 2 MG/ML IJ SOLN: 2.0000 mg | Freq: Once | INTRAMUSCULAR | Status: DC

## 2013-05-19 MED ORDER — HYDROCODONE-ACETAMINOPHEN 5-325 MG PO TABS
ORAL_TABLET | ORAL | Status: AC
  Filled 2013-05-19: qty 2

## 2013-05-19 MED ORDER — SODIUM CHLORIDE 0.9 % IV BOLUS (SEPSIS)
1000.0000 mL | Freq: Once | INTRAVENOUS | Status: AC
  Administered 2013-05-19: 1000 mL via INTRAVENOUS

## 2013-05-19 MED ORDER — HYDROMORPHONE HCL PF 2 MG/ML IJ SOLN
2.0000 mg | Freq: Once | INTRAMUSCULAR | Status: AC
  Administered 2013-05-19: 2 mg via INTRAVENOUS
  Filled 2013-05-19: qty 1

## 2013-05-19 MED ORDER — CARISOPRODOL 350 MG PO TABS: 350.0000 mg | ORAL_TABLET | Freq: Three times a day (TID) | ORAL | Status: DC

## 2013-05-19 MED ORDER — ONDANSETRON HCL 4 MG/2ML IJ SOLN: 4.0000 mg | Freq: Once | INTRAMUSCULAR | Status: DC

## 2013-05-19 MED ORDER — SODIUM CHLORIDE 0.9 % IV BOLUS (SEPSIS): 2000.0000 mL | Freq: Once | INTRAVENOUS | Status: DC

## 2013-05-19 MED ORDER — SODIUM CHLORIDE 0.9 % IV SOLN: INTRAVENOUS | Status: DC

## 2013-05-19 MED ORDER — HYDROCODONE-ACETAMINOPHEN 5-325 MG PO TABS
2.0000 | ORAL_TABLET | Freq: Once | ORAL | Status: AC
  Administered 2013-05-19: 2 via ORAL

## 2013-05-19 MED ORDER — HYDROMORPHONE HCL PF 1 MG/ML IJ SOLN: 1.0000 mg | Freq: Once | INTRAMUSCULAR | Status: DC

## 2013-05-19 NOTE — Discharge Instructions (Signed)
Abdominal (belly) pain can be caused by many things. Your caregiver performed an examination and possibly ordered blood/urine tests and imaging (CT scan, x-rays, ultrasound). Many cases can be observed and treated at home after initial evaluation in the emergency department. Even though you are being discharged home, abdominal pain can be unpredictable. Therefore, you need a repeated exam if your pain does not resolve, returns, or worsens. Most patients with abdominal pain don't have to be admitted to the hospital or have surgery, but serious problems like appendicitis and gallbladder attacks can start out as nonspecific pain. Many abdominal conditions cannot be diagnosed in one visit, so follow-up evaluations are very important. °SEEK IMMEDIATE MEDICAL ATTENTION IF: °The pain does not go away or becomes severe.  °A temperature above 101 develops.  °Repeated vomiting occurs (multiple episodes).  °The pain becomes localized to portions of the abdomen. The right side could possibly be appendicitis. In an adult, the left lower portion of the abdomen could be colitis or diverticulitis.  °Blood is being passed in stools or vomit (bright red or black tarry stools).  °Return also if you develop chest pain, difficulty breathing, dizziness or fainting, or become confused, poorly responsive, or inconsolable (young children). ° ° ° ° ° ° °  Emergency Department Resource Guide °1) Find a Doctor and Pay Out of Pocket °Although you won't have to find out who is covered by your insurance plan, it is a good idea to ask around and get recommendations. You will then need to call the office and see if the doctor you have chosen will accept you as a new patient and what types of options they offer for patients who are self-pay. Some doctors offer discounts or will set up payment plans for their patients who do not have insurance, but you will need to ask so you aren't surprised when you get to your appointment. ° °2) Contact Your Local  Health Department °Not all health departments have doctors that can see patients for sick visits, but many do, so it is worth a call to see if yours does. If you don't know where your local health department is, you can check in your phone book. The CDC also has a tool to help you locate your state's health department, and many state websites also have listings of all of their local health departments. ° °3) Find a Walk-in Clinic °If your illness is not likely to be very severe or complicated, you may want to try a walk in clinic. These are popping up all over the country in pharmacies, drugstores, and shopping centers. They're usually staffed by nurse practitioners or physician assistants that have been trained to treat common illnesses and complaints. They're usually fairly quick and inexpensive. However, if you have serious medical issues or chronic medical problems, these are probably not your best option. ° °No Primary Care Doctor: °- Call [facility] Connect at  832-8000 - they can help you locate a primary care doctor that  accepts your insurance, provides certain services, etc. °- Physician Referral Service- 1-800-533-3463 ° °Chronic Pain Problems: °Organization         Address  Phone   Notes  °[facility]  ([phone]) [phone] Patients need to be referred by their primary care doctor.  ° °Medication Assistance: °Organization         Address  Phone   Notes  °[place] [facility] [address] °[place], [place] [address] (336) 641-8030 --Must be a   resident of [place] °-- Must have NO insurance coverage whatsoever (no Medicaid/ Medicare, etc.) °-- The pt. MUST have a primary care doctor that directs their care regularly and follows them in the community °  °[facility]  (866) 331-1348   °[employer]  (888) 892-1162   ° °Agencies that provide inexpensive medical care: °Organization         Address  Phone   Notes  °[facility]  (336) 832-8035    °[facility]    (336) 832-7272   °[facility] [address] °[place], [place] [address] (336) 832-4777   °[facility] [address], °[place] (336) 271-4999   °[facility]    (336) 373-0678   °[facility]    (336) 272-1050   °[facility] ° [address], [place] Phone:  (336) 832-4444, Fax:  (336) 832-4440 Hours of Operation:  9 am - 6 pm, M-F.  Also accepts Medicaid/Medicare and self-pay.  °[facility] ° [address], [place] Phone: (336) 832-3150, Fax: (336) [phone]. Hours of Operation:  8:30 am - 5:30 pm, M-F.  Also accepts Medicaid and self-pay.  °[facility] [place] [address], [place] Phone: (336) 878-6027   °[facility] [address], [place], [place] (336)723-1848, Ext. 123 Mondays & Thursdays: 7-9 AM.  First 15 patients are seen on a first come, first serve basis. °  ° °Medicaid-accepting [place] Providers: ° °Organization         Address  Phone   Notes  °[facility] [address], [place] (336) 641-2100 Also accepts self-pay patients.  °[facility] [address], [place] ° (336) 856-9996   °[facility] [address], [place] (336) 288-8857   °[facility] [address] [place] [address], [place] (336) 299-7000   °Veita B[facility] [address], [place]  ° (336) [phone] Only accepts [place] Access Medicaid patients after they have their name applied to their card.  ° °Self-Pay (no insurance) in [place]: ° °Organization         Address  Phone   Notes  °Sickle Cell Patients, [facility] [address], [place] (336) 832-1970   °[facility] [address], [place] (336) 832-4400   °[facility] ° [address],  [place] (336) 992-4800   °[facility]/Dr. Osei-Bonsu ° [address] [place] [address], [place] or [address], [place] (336) [phone] Phone number for both [place] and [place] locations is the same.  °[facility] [address], [place] (336) [phone]   °[facility] [address] [place] [address], [place] or [address] (336) 852-7530 °(336) 878-2260   °[facility] [address], [place] (336) 350-1642, phone; (336) 294-5005, fax Sees patients 1st and 3rd Saturday of every month.  Must not qualify for public or private insurance (i.e. Medicaid, Medicare, [place] Health Choice, Veterans' Benefits) • Household income should be no more than 200% of the poverty level •The clinic cannot treat you if you are pregnant or think you are pregnant • Sexually transmitted diseases are not treated at the clinic.  ° °Dental Care: °Organization         Address    Phone  Notes  °[place] [facility] [address], [place] (336) 641-6152 Accepts children up to age 21 who are enrolled in Medicaid or [place] Health Choice; pregnant women with a Medicaid card; and children who have applied for Medicaid or [place] Health Choice, but were declined, whose parents can pay a reduced fee at time of service.  °[facility] [place]  [address], [place] (336) 641-7733 Accepts children up to age 21 who are enrolled in Medicaid or [place] Health Choice; pregnant women with a Medicaid card; and children who have applied for Medicaid or [place] Health Choice, but were declined, whose parents can pay a reduced fee at time of service.  °[facility] ° [address], [place] (336) 641-4533 Patients are seen by appointment only. Walk-ins are not accepted. [facility] will see patients 18 years of age and older. °Monday - Tuesday (8am-5pm) °Most Wednesdays  (8:30-5pm) °$30 per visit, cash only  °[facility] ° [address], [place] (336) [phone] Patients are seen by appointment only. Walk-ins are not accepted. [facility] will see patients 18 years of age and older. °One Wednesday Evening (Monthly: Volunteer Based).  $30 per visit, cash only  °[facility]  ([phone]) [phone] for adults; Children under age 4, call Graduate Pediatric Dentistry at ([phone]) [phone]. Children aged 4-14, please call ([phone]) [phone] to request a pediatric application. ° Dental services are provided in all areas of dental care including fillings, crowns and bridges, complete and partial dentures, implants, gum treatment, root canals, and extractions. Preventive care is also provided. Treatment is provided to both adults and children. °Patients are selected via a lottery and there is often a waiting list. °  °[facility] [address], °[place] ° (336) 763-8833 [email or web address] °  °[facility] [address], [place], [place] (336)723-1848, Ext. 123 Second and Fourth Thursday of each month, opens at 6:30 AM; Clinic ends at 9 AM.  Patients are seen on a first-come first-served basis, and a limited number are seen during each clinic.  ° °[facility] ° [address], [place], [place] (336) [phone]   Eligibility Requirements °You must have lived in [place], [place], or [place] counties for at least the last three months. °  You cannot be eligible for state or federal sponsored healthcare insurance, including Veterans Administration, Medicaid, or Medicare. °  You generally cannot be eligible for healthcare insurance through your employer.  °  How to apply: °Eligibility screenings are held every Tuesday and Wednesday afternoon from 1:00 pm until 4:00 pm. You do not need an appointment for the interview!  °[facility] [address], [place], [place] 336-631-2330   °[place] [facility]  336-342-8273   °[facility]  336-703-3100   °[facility]  336-570-6415   ° °Behavioral Health Resources in the Community: °Intensive Outpatient Programs °Organization         Address  Phone  Notes  °[facility] [address], [place], [place] 336-878-6098   °[facility] [address], [place], [place] 336-832-9800   °[facility] [address], [place], [place] ° 336-882-2125   °[facility] [address],  °, [place] 1-800-853-5163 or 336-641-4981   °Substance Abuse Resources °Organization           Address  Phone  Notes  [facility]  705-053-1465   [place]  717-168-7330   [facility]  [phone]   Chinita Pester  4403116744   Residential & Outpatient Substance Abuse Program  747-248-4664   Psychological Services Organization         Address  Phone  Notes  [facility] [place]  [place]  (215) 086-7284   [place] [address] [place] or [phone]    Mobile Crisis Teams Organization         Address  Phone  Notes  [facility], Mobile Crisis Care Unit  7655621996   [facility]  [address] [place], [place]   [place] [facility] [address], [place] 726-846-5002    Self-Help/Support Groups Organization         Address  Phone             Notes  [place] [place] - variety of support groups  [place] Call for more information  Narcotics Anonymous (NA), [facility] [address], [place]  2 meetings at this location   Special educational needs teacher         Address  Phone  Notes  [facility] [place],    [place]  1-3252547736   [facility]  [address], [place] [phone], [place], [place]   [place], [place] [phone] Admissions: 8am-3pm M-F  [facility] [place] [address],    [place], [place] [phone]   [facility] [address] [place], [place], [place]   [facility] [address]  [place], [place]   [facility] Intensive Outpatient [address], [place], [place]   [facility] ([place] [place].,  [place], [place] 1-4314033671 or 7706922476   [facility] ([facility]) [address], [place], [place] Accepts Medicaid  [facility] [address],  [place] 1-(313)238-3469 Substance Abuse/Addiction Treatment   [facility]         Address  Phone  Notes  [facility]  [phone]   Domenic Schwab, PhD [address] Arlis [place], [place]   [phone] or (581)007-8356   [facility] [place], [place] ([phone]) [phone]   [facility] [address], [place], [place] 705-135-1597 Insurance/Medicaid/sponsorship through [facility] [address]                                    [place], [place] [phone] [place] [address], [place] [phone]    Dr. Adele Schilder  [phone]   [facility] Dept. 1) [address] [place] 2) [place] 3)  [address], [place] [phone]  [phone]   [place] [phone] or [phone] (After Hours)

## 2013-05-19 NOTE — ED Notes (Signed)
Attempted to start IV x2 RN. Used AccuVein to try and obtain IV access. Pt reports, "last time I had to have a PICC." Pt reports," I had IO access attempted and failed in the past as well." EDP aware and reported would talk with pt. No new orders given at this time.

## 2013-05-19 NOTE — ED Notes (Signed)
Pt states a "pulling" pain to her abdomen. States fistual to abdomen and that is the same site the pain is coming from. Surgeon is aware of problem and she is to see him April 8th. Vomiting and diarrhea also. Pt states her "bowels are pushing through" an surgeon is supposed to "go in a and fix it"

## 2013-05-19 NOTE — ED Notes (Addendum)
Consulted with EDP regarding alternative routes for CT,Meds, Blood. EDP reported okay to complete CT without IV contrast. Lab to obtain blood work and [facility] IM if unable to obtain an IV in foot. IV in Foot obtained. EDP aware.

## 2013-05-19 NOTE — ED Provider Notes (Signed)
CSN: [id number]     Arrival date & time 05/19/13  1620 History   First MD Initiated Contact with Patient 05/19/13 1646     Chief Complaint  Patient presents with  . 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 She feels generally weak and lightheaded today and felt like she could not walk prior to arrival due to her generalized weakness and lightheadedness. Past Medical History  Diagnosis Date  . Depression   . Chronic anticoagulation   . Pulmonary emboli 2007  . Chronic pain syndrome   . Chronic anxiety   . Non-diabetic hypoglycemia    Past Surgical History  Procedure Laterality Date  . Gastric bypass  2003  . Cesarean section    . Appendectomy    . Tonsillectomy    . Abdominal hysterectomy     No family history on file. History  Substance Use Topics  . Smoking status: Never Smoker   . Smokeless tobacco: Not on file  . Alcohol Use: No   OB History   Grav Para Term Preterm Abortions TAB SAB Ect Mult Living                 Review of Systems 10 Systems reviewed and are negative for acute change except as noted in the  HPI.   Allergies  Amoxicillin; Augmentin; Betadine; Ciprofloxacin; Erythromycin; Latex; Penicillins; and Adhesive  Home Medications   Current Outpatient Rx  Name  Route  Sig  Dispense  Refill  . bisacodyl (DULCOLAX) 5 MG EC tablet   Oral   Take 10 mg by mouth daily. To be taken on an empty stomach         . Cholecalciferol (VITAMIN D) 2000 UNITS CAPS   Oral   Take 2,000 Units by mouth daily.         . clonazePAM (KLONOPIN) 2 MG tablet   Oral   Take 2 mg by mouth 3 (three) times daily.         Marland Kitchen docusate sodium (COLACE) 100 MG capsule   Oral   Take 100 mg by mouth 2 (two) times daily.         . fluticasone (FLONASE) 50 MCG/ACT nasal spray   Each Nare   Place 1 spray into both nostrils daily.         Marland Kitchen gabapentin (NEURONTIN) 600 MG tablet   Oral   Take 1,200 mg by mouth 3 (three) times daily.         Marland Kitchen glucagon (GLUCAGEN HYPOKIT) 1 MG SOLR injection   Intravenous   Inject 1 mg into the vein once as needed for low blood sugar.         . hydrOXYzine (  ATARAX/VISTARIL) 25 MG tablet   Oral   Take 1 tablet (25 mg total) by mouth 3 (three) times daily as needed for anxiety or itching.   12 tablet   0   . Liniments (ANALGESIC BALM EX)   Apply externally   Apply 1 application topically 4 (four) times daily as needed (for pain).         . magnesium oxide (MAG-OX) 400 MG tablet   Oral   Take 400 mg by mouth 3 (three) times daily.         . methadone (DOLOPHINE) 10 MG tablet   Oral   Take 60 mg by mouth 2 (two) times daily.         . methocarbamol (ROBAXIN) 750 MG tablet   Oral   Take 750 mg by mouth 3 (three) times daily.         Marland Kitchen OLANZapine (ZYPREXA) 10 MG tablet   Oral   Take 10 mg by mouth at bedtime.         Marland Kitchen omeprazole (PRILOSEC) 20 MG capsule   Oral   Take 40 mg by mouth daily.         . ondansetron (ZOFRAN) 4 MG tablet   Oral   Take 8 mg by mouth 3 (three) times daily as needed for nausea or vomiting.         Marland Kitchen oxyCODONE  (OXY IR/ROXICODONE) 5 MG immediate release tablet   Oral   Take 1 tablet (5 mg total) by mouth every 6 (six) hours as needed for moderate pain.   15 tablet   0   . OxyCODONE (OXYCONTIN) 20 mg T12A 12 hr tablet   Oral   Take 1 tablet (20 mg total) by mouth every 12 (twelve) hours.   8 tablet   0   . propranolol (INDERAL) 40 MG tablet   Oral   Take 40 mg by mouth 3 (three) times daily.         . Rivaroxaban (XARELTO) 20 MG TABS tablet   Oral   Take 20 mg by mouth daily with supper.         . traZODone (DESYREL) 100 MG tablet   Oral   Take 200 mg by mouth at bedtime.         . vitamin B-12 (CYANOCOBALAMIN) 1000 MCG tablet   Oral   Take 1,000 mcg by mouth daily.         . carisoprodol (SOMA) 350 MG tablet   Oral   Take 1 tablet (350 mg total) by mouth 3 (three) times daily.   15 tablet   0    BP 101/63  Pulse 92  Temp(Src) 97.7 F (36.5 C) (Oral)  Resp 20  SpO2 95% Physical Exam  Nursing note and vitals reviewed. Constitutional:  Awake, alert, nontoxic appearance.  HENT:  Head: Atraumatic.  Eyes: Right eye exhibits no discharge. Left eye exhibits no discharge.  Neck: Neck supple.  Cardiovascular: Regular rhythm.   No murmur heard. Tachycardic  Pulmonary/Chest: Effort normal and breath sounds normal. No respiratory distress. She has no wheezes. She has no rales. She exhibits no tenderness.  Abdominal: Soft. Bowel sounds are normal. She exhibits no distension and no mass. There is tenderness. There is no rebound and no guarding.  Diffuse mild to moderate abdominal tenderness without rebound; apparent tiny infraumbilical fistula not currently draining  Musculoskeletal: She exhibits no tenderness.  Baseline ROM, no obvious new focal weakness.  Neurological: She is alert.  Mental status and motor strength appears baseline for patient and situation.  Skin: No rash noted.  Psychiatric: She has a normal mood and affect.    ED Course  Procedures (including  critical care time) Patient understand and agree with initial ED impression and plan with expectations set for ED visit (labs, CT).Patient informed of clinical course, understand medical decision-making process, and agree with plan.Pt stable in ED with no significant deterioration in condition. Labs Review Labs Reviewed  CBC WITH DIFFERENTIAL - Abnormal; Notable for the following:    RBC 3.49 (*)    Hemoglobin 9.3 (*)    HCT 30.5 (*)    All other components within normal limits  COMPREHENSIVE METABOLIC PANEL - Abnormal; Notable for the following:    Albumin 3.4 (*)    All other components within normal limits  LIPASE, BLOOD  URINALYSIS, ROUTINE W REFLEX MICROSCOPIC  PREGNANCY, URINE  I-STAT CG4 LACTIC ACID, ED   Imaging Review Ct Abdomen Pelvis Wo Contrast  05/19/2013   CLINICAL DATA:  Severe lower abdominal pain, history of gastric bypass in 2409 was complications, chronic infraumbilical fistula  EXAM: CT ABDOMEN AND PELVIS WITHOUT CONTRAST  TECHNIQUE: Multidetector CT imaging of the abdomen and pelvis was performed following the standard protocol without intravenous contrast.  COMPARISON:  None.  FINDINGS: Lung bases are clear.  Postsurgical changes related to gastric bypass. Patent gastrojejunostomy. Small hiatal hernia with residual contrast in the distal esophagus.  Unenhanced liver, pancreas, and adrenal glands are within normal limits.  Mild splenomegaly, measuring 14.1 cm in maximal dimension.  Layering gallstones (series 2/ image 35), without associated inflammatory changes. No intrahepatic or extrahepatic ductal dilatation.  Cortical calcifications along the anterior interpolar right kidney (series 2/image 42). Left kidney is within normal limits. No renal calculi or hydronephrosis.  No evidence of bowel obstruction.  Prior appendectomy.  No evidence of abdominal aortic aneurysm. IVC filter. 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  Trace pelvic ascites with nonspecific pelvic stranding (Series 2/ image 76).  No suspicious abdominopelvic lymphadenopathy.  Status post hysterectomy.  No adnexal masses.  Mildly thick-walled bladder (series 2/image 82).  Mild stranding in the right paramidline lower anterior abdominal wall (series 2/ image 71), without associated discrete fluid collection/abscess.  Mild degenerative changes of the visualized thoracolumbar spine.  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 Correlate for bilateral lower extremity swelling.  Trace pelvic ascites with nonspecific pelvic stranding, of uncertain etiology. This may reflect lymphatic stranding related to iliac vein thrombus, or less likely rectosigmoid colitis.  Mildly thick-walled bladder, correlate for cystitis.  Mild stranding in the right paramidline lower anterior abdominal wall, possibly corresponding to known fistula, although without discrete fluid collection/abscess.  Additional ancillary findings as above.  These results were called by telephone at the time of interpretation on 05/19/2013 at 8:04 PM to Dr. Riki Altes , who verbally acknowledged these results.   Electronically Signed   By: Julian Hy M.D.   On: 05/19/2013 20:05     EKG Interpretation None      MDM   Final diagnoses:  Abdominal pain  Vomiting and diarrhea  Chronic abdominal pain  Chronic generalized pain    I doubt any other EMC precluding discharge at this time including, but not necessarily limited to the following:SBI, peritonitis.    Babette Relic, MD 05/21/13 [id number]

## 2013-05-26 ENCOUNTER — Encounter (HOSPITAL_COMMUNITY): Payer: Self-pay | Admitting: Emergency Medicine

## 2013-05-26 ENCOUNTER — Emergency Department (HOSPITAL_COMMUNITY)
Admission: EM | Admit: 2013-05-26 | Discharge: 2013-05-27 | Disposition: A | Discharge status: Home / Self Care | Payer: Medicaid Other | Attending: Emergency Medicine | Admitting: Emergency Medicine

## 2013-05-26 DIAGNOSIS — Z86711 Personal history of pulmonary embolism: Secondary | ICD-10-CM | POA: Insufficient documentation

## 2013-05-26 DIAGNOSIS — Z79899 Other long term (current) drug therapy: Secondary | ICD-10-CM | POA: Insufficient documentation

## 2013-05-26 DIAGNOSIS — E559 Vitamin D deficiency, unspecified: Secondary | ICD-10-CM | POA: Insufficient documentation

## 2013-05-26 DIAGNOSIS — Z91040 Latex allergy status: Secondary | ICD-10-CM | POA: Insufficient documentation

## 2013-05-26 DIAGNOSIS — M79605 Pain in left leg: Secondary | ICD-10-CM

## 2013-05-26 DIAGNOSIS — Z9114 Patient's other noncompliance with medication regimen: Secondary | ICD-10-CM

## 2013-05-26 DIAGNOSIS — D6862 Lupus anticoagulant syndrome: Secondary | ICD-10-CM

## 2013-05-26 DIAGNOSIS — G43909 Migraine, unspecified, not intractable, without status migrainosus: Secondary | ICD-10-CM | POA: Insufficient documentation

## 2013-05-26 DIAGNOSIS — O9989 Other specified diseases and conditions complicating pregnancy, childbirth and the puerperium: Secondary | ICD-10-CM | POA: Insufficient documentation

## 2013-05-26 DIAGNOSIS — E538 Deficiency of other specified B group vitamins: Secondary | ICD-10-CM | POA: Insufficient documentation

## 2013-05-26 DIAGNOSIS — K219 Gastro-esophageal reflux disease without esophagitis: Secondary | ICD-10-CM | POA: Insufficient documentation

## 2013-05-26 DIAGNOSIS — G894 Chronic pain syndrome: Secondary | ICD-10-CM | POA: Insufficient documentation

## 2013-05-26 DIAGNOSIS — F329 Major depressive disorder, single episode, unspecified: Secondary | ICD-10-CM | POA: Insufficient documentation

## 2013-05-26 DIAGNOSIS — G8929 Other chronic pain: Secondary | ICD-10-CM

## 2013-05-26 DIAGNOSIS — O2230 Deep phlebothrombosis in pregnancy, unspecified trimester: Secondary | ICD-10-CM

## 2013-05-26 DIAGNOSIS — R894 Abnormal immunological findings in specimens from other organs, systems and tissues: Secondary | ICD-10-CM | POA: Insufficient documentation

## 2013-05-26 DIAGNOSIS — O99340 Other mental disorders complicating pregnancy, unspecified trimester: Secondary | ICD-10-CM | POA: Insufficient documentation

## 2013-05-26 DIAGNOSIS — Z91199 Patient's noncompliance with other medical treatment and regimen due to unspecified reason: Secondary | ICD-10-CM | POA: Insufficient documentation

## 2013-05-26 DIAGNOSIS — Z9119 Patient's noncompliance with other medical treatment and regimen: Secondary | ICD-10-CM | POA: Insufficient documentation

## 2013-05-26 DIAGNOSIS — D509 Iron deficiency anemia, unspecified: Secondary | ICD-10-CM | POA: Insufficient documentation

## 2013-05-26 DIAGNOSIS — R609 Edema, unspecified: Secondary | ICD-10-CM

## 2013-05-26 DIAGNOSIS — F411 Generalized anxiety disorder: Secondary | ICD-10-CM | POA: Insufficient documentation

## 2013-05-26 DIAGNOSIS — M79604 Pain in right leg: Secondary | ICD-10-CM

## 2013-05-26 DIAGNOSIS — Z880 Allergy status to penicillin: Secondary | ICD-10-CM | POA: Insufficient documentation

## 2013-05-26 DIAGNOSIS — I82409 Acute embolism and thrombosis of unspecified deep veins of unspecified lower extremity: Secondary | ICD-10-CM | POA: Insufficient documentation

## 2013-05-26 DIAGNOSIS — IMO0002 Reserved for concepts with insufficient information to code with codable children: Secondary | ICD-10-CM | POA: Insufficient documentation

## 2013-05-26 DIAGNOSIS — Z7901 Long term (current) use of anticoagulants: Secondary | ICD-10-CM | POA: Insufficient documentation

## 2013-05-26 DIAGNOSIS — F3289 Other specified depressive episodes: Secondary | ICD-10-CM | POA: Insufficient documentation

## 2013-05-26 HISTORY — DX: Low back pain: M54.5

## 2013-05-26 HISTORY — DX: Encephalopathy, unspecified: G93.40

## 2013-05-26 HISTORY — DX: Vitamin D deficiency, unspecified: E55.9

## 2013-05-26 HISTORY — DX: Deficiency of other specified B group vitamins: E53.8

## 2013-05-26 HISTORY — DX: Fibromyalgia: M79.7

## 2013-05-26 HISTORY — DX: Other chronic pain: G89.29

## 2013-05-26 HISTORY — DX: Unspecified abdominal pain: R10.9

## 2013-05-26 HISTORY — DX: Sedative, hypnotic or anxiolytic dependence, uncomplicated: F13.20

## 2013-05-26 HISTORY — DX: Low back pain, unspecified: M54.50

## 2013-05-26 HISTORY — DX: Opioid dependence, uncomplicated: F11.20

## 2013-05-26 HISTORY — DX: Migraine, unspecified, not intractable, without status migrainosus: G43.909

## 2013-05-26 HISTORY — DX: Poisoning by unspecified drugs, medicaments and biological substances, accidental (unintentional), initial encounter: T50.901A

## 2013-05-26 HISTORY — DX: Gastro-esophageal reflux disease without esophagitis: K21.9

## 2013-05-26 HISTORY — DX: Acute embolism and thrombosis of unspecified deep veins of unspecified lower extremity: I82.409

## 2013-05-26 HISTORY — DX: Iron deficiency anemia, unspecified: D50.9

## 2013-05-26 HISTORY — DX: Hypoglycemia, unspecified: E16.2

## 2013-05-26 LAB — CBC WITH DIFFERENTIAL/PLATELET
BASOS ABS: 0 10*3/uL (ref 0.0–0.1)
Basophils Relative: 1 % (ref 0–1)
EOS PCT: 5 % (ref 0–5)
Eosinophils Absolute: 0.2 10*3/uL (ref 0.0–0.7)
HCT: 28.6 % — ABNORMAL LOW (ref 36.0–46.0)
Hemoglobin: 8.9 g/dL — ABNORMAL LOW (ref 12.0–15.0)
LYMPHS ABS: 1.2 10*3/uL (ref 0.7–4.0)
LYMPHS PCT: 29 % (ref 12–46)
MCH: 26.8 pg (ref 26.0–34.0)
MCHC: 31.1 g/dL (ref 30.0–36.0)
MCV: 86.1 fL (ref 78.0–100.0)
Monocytes Absolute: 0.5 10*3/uL (ref 0.1–1.0)
Monocytes Relative: 13 % — ABNORMAL HIGH (ref 3–12)
NEUTROS ABS: 2.2 10*3/uL (ref 1.7–7.7)
Neutrophils Relative %: 52 % (ref 43–77)
PLATELETS: 140 10*3/uL — AB (ref 150–400)
RBC: 3.32 MIL/uL — AB (ref 3.87–5.11)
RDW: 14.9 % (ref 11.5–15.5)
WBC: 4.1 10*3/uL (ref 4.0–10.5)

## 2013-05-26 MED ORDER — RIVAROXABAN 20 MG PO TABS
20.0000 mg | ORAL_TABLET | Freq: Once | ORAL | Status: AC
  Administered 2013-05-26: 20 mg via ORAL
  Filled 2013-05-26: qty 1

## 2013-05-26 MED ORDER — RIVAROXABAN 10 MG PO TABS
ORAL_TABLET | ORAL | Status: AC
  Filled 2013-05-26: qty 2

## 2013-05-26 NOTE — ED Provider Notes (Signed)
CSN: [id number]     Arrival date & time 05/26/13  2040 History   First MD Initiated Contact with Patient 05/26/13 2306    This chart was scribed for Delora Fuel, MD by Terressa Koyanagi, ED Scribe. This patient was seen in room [facility]2/[facility] and the patient's care was started at 11:11 PM.  No primary provider on file.  Chief Complaint  Patient presents with  . Leg Pain   HPI HPI Comments: Leslie Marks is a 43 y.o. female brought in by ambulance, with a history of chronic pain, GERD, fibromyalgia, pulmonary embolism, OD, and DVT, who presents to the Emergency Department complaining of pain in lower extremities bilaterally onset last night and worsening today. Pt reports she is having difficulty walking and rates the pain a 10 out of 10. Pt specifies the pain is in her upper thigh and denies pain in her calf muscles or feet. Pt states she has has been noncompliant with her meds. Specifically, pt reports she has not been taking her Xarelto (rivaroxaban). She states that she last took Xarelto two days ago Pt denies SOB or chest pain.   The EMS reports that pt was treated at the ED yesterday where she was diagnosed with bilateral DVTs.   Past Medical History  Diagnosis Date  . Depression   . Chronic anticoagulation   . Pulmonary emboli 2007  . Chronic pain syndrome   . Chronic anxiety   . Non-diabetic hypoglycemia   . Chronic abdominal pain   . Lumbago   . Migraine   . Iron deficiency anemia   . B12 deficiency   . Fibromyalgia   . GERD (gastroesophageal reflux disease)   . Hypoglycemia   . Vitamin D deficiency   . Encephalopathy   . Opiate dependence   . Benzodiazepine dependence   . Overdose   . DVT (deep venous thrombosis)    Past Surgical History  Procedure Laterality Date  . Gastric bypass  2003  . Cesarean section    . Appendectomy    . Tonsillectomy    . Abdominal hysterectomy     No family history on file. History  Substance Use Topics  . Smoking status: Never Smoker   .  Smokeless tobacco: Not on file  . Alcohol Use: No   OB History   Grav Para Term Preterm Abortions TAB SAB Ect Mult Living                 Review of Systems  Respiratory: Negative for shortness of breath.   Cardiovascular: Positive for leg swelling. Negative for chest pain.  Musculoskeletal:       Pain in lower extremities   All other systems reviewed and are negative.     Allergies  Amoxicillin; Augmentin; Betadine; Ciprofloxacin; Erythromycin; Latex; Penicillins; and Adhesive  Home Medications   Current Outpatient Rx  Name  Route  Sig  Dispense  Refill  . cloNIDine (CATAPRES) 0.1 MG tablet   Oral   Take 0.1 mg by mouth every 8 (eight) hours.         . cyclobenzaprine (FLEXERIL) 10 MG tablet   Oral   Take 10 mg by mouth 3 (three) times daily.         Marland Kitchen esomeprazole (NEXIUM) 40 MG capsule   Oral   Take 40 mg by mouth 2 (two) times daily.         Marland Kitchen gabapentin (NEURONTIN) 300 MG capsule   Oral   Take 600 mg by mouth daily.         Marland Kitchen  gabapentin (NEURONTIN) 400 MG capsule   Oral   Take 1,200 mg by mouth 2 (two) times daily.         . mometasone (NASONEX) 50 MCG/ACT nasal spray   Nasal   Place 2 sprays into the nose daily.         Marland Kitchen OLANZapine zydis (ZYPREXA) 10 MG disintegrating tablet   Oral   Take 10 mg by mouth at bedtime.         . OxyCODONE HCl ER (OXYCONTIN) 30 MG T12A   Oral   Take 30 mg by mouth every 12 (twelve) hours.         . pantoprazole (PROTONIX) 40 MG tablet   Oral   Take 40 mg by mouth daily.         Marland Kitchen thiamine (VITAMIN B-1) 100 MG tablet   Oral   Take 100 mg by mouth 2 (two) times daily.         . traZODone (DESYREL) 100 MG tablet   Oral   Take 200 mg by mouth at bedtime.         Marland Kitchen zolpidem (AMBIEN) 10 MG tablet   Oral   Take 10 mg by mouth at bedtime.         . bisacodyl (DULCOLAX) 5 MG EC tablet   Oral   Take 10 mg by mouth daily. To be taken on an empty stomach         . carisoprodol (SOMA) 350 MG  tablet   Oral   Take 1 tablet (350 mg total) by mouth 3 (three) times daily.   15 tablet   0   . Cholecalciferol (VITAMIN D) 2000 UNITS CAPS   Oral   Take 2,000 Units by mouth daily.         . clonazePAM (KLONOPIN) 2 MG tablet   Oral   Take 2 mg by mouth 3 (three) times daily.         Marland Kitchen docusate sodium (COLACE) 100 MG capsule   Oral   Take 100 mg by mouth 2 (two) times daily.         . fluticasone (FLONASE) 50 MCG/ACT nasal spray   Each Nare   Place 1 spray into both nostrils daily.         Marland Kitchen gabapentin (NEURONTIN) 600 MG tablet   Oral   Take 1,200 mg by mouth 3 (three) times daily.         Marland Kitchen glucagon (GLUCAGEN HYPOKIT) 1 MG SOLR injection   Intravenous   Inject 1 mg into the vein once as needed for low blood sugar.         . hydrOXYzine (ATARAX/VISTARIL) 25 MG tablet   Oral   Take 1 tablet (25 mg total) by mouth 3 (three) times daily as needed for anxiety or itching.   12 tablet   0   . Liniments (ANALGESIC BALM EX)   Apply externally   Apply 1 application topically 4 (four) times daily as needed (for pain).         . magnesium oxide (MAG-OX) 400 MG tablet   Oral   Take 400 mg by mouth 3 (three) times daily.         . methadone (DOLOPHINE) 10 MG tablet   Oral   Take 60 mg by mouth 2 (two) times daily.         . methocarbamol (ROBAXIN) 750 MG tablet   Oral   Take 750 mg by mouth 3 (  three) times daily.         Marland Kitchen OLANZapine (ZYPREXA) 10 MG tablet   Oral   Take 10 mg by mouth at bedtime.         Marland Kitchen omeprazole (PRILOSEC) 20 MG capsule   Oral   Take 40 mg by mouth daily.         . ondansetron (ZOFRAN) 4 MG tablet   Oral   Take 8 mg by mouth 3 (three) times daily as needed for nausea or vomiting.         Marland Kitchen oxyCODONE (OXY IR/ROXICODONE) 5 MG immediate release tablet   Oral   Take 1 tablet (5 mg total) by mouth every 6 (six) hours as needed for moderate pain.   15 tablet   0   . OxyCODONE (OXYCONTIN) 20 mg T12A 12 hr tablet    Oral   Take 1 tablet (20 mg total) by mouth every 12 (twelve) hours.   8 tablet   0   . propranolol (INDERAL) 40 MG tablet   Oral   Take 40 mg by mouth 3 (three) times daily.         . Rivaroxaban (XARELTO) 20 MG TABS tablet   Oral   Take 20 mg by mouth daily with supper.         . traZODone (DESYREL) 100 MG tablet   Oral   Take 200 mg by mouth at bedtime.         . vitamin B-12 (CYANOCOBALAMIN) 1000 MCG tablet   Oral   Take 1,000 mcg by mouth daily.          Triage Vitals: BP 104/56  Pulse 127  Temp(Src) 98.1 F (36.7 C) (Oral)  Resp 24  Ht 5\' 5"  (1.651 m)  Wt 190 lb (86.183 kg)  BMI 31.62 kg/m2  SpO2 100% Physical Exam  Nursing note and vitals reviewed. Constitutional: She is oriented to person, place, and time. She appears well-developed and well-nourished. No distress.  Lethargic but arousable  HENT:  Head: Normocephalic and atraumatic.  Eyes: EOM are normal. Pupils are equal, round, and reactive to light.  Neck: Neck supple. No JVD present. No tracheal deviation present.  Cardiovascular: Normal rate.  Exam reveals no gallop.   No murmur heard. Pulmonary/Chest: Effort normal. No respiratory distress.  Abdominal: She exhibits no mass. There is no tenderness.  Musculoskeletal: Normal range of motion.  2+ pitting edema of the legs  Moderate tenderness diffused through thighs and inguinal region   Lymphadenopathy:    She has no cervical adenopathy.  Neurological: She is alert and oriented to person, place, and time. No cranial nerve deficit.  Skin: Skin is warm and dry. No rash noted.  Psychiatric: She has a normal mood and affect. Her behavior is normal.    ED Course  Procedures (including critical care time) DIAGNOSTIC STUDIES: Oxygen Saturation is 100% on RA, normal by my interpretation.    COORDINATION OF CARE:  11:18 PM-Discussed treatment plan which includes meds, labs with pt at bedside and pt agreed to plan.   Labs Review Results for  orders placed during the hospital encounter of 05/26/13  CBC WITH DIFFERENTIAL      Result Value Ref Range   WBC 4.1  4.0 - 10.5 K/uL   RBC 3.32 (*) 3.87 - 5.11 MIL/uL   Hemoglobin 8.9 (*) 12.0 - 15.0 g/dL   HCT 28.6 (*) 36.0 - 46.0 %   MCV 86.1  78.0 - 100.0 fL  MCH 26.8  26.0 - 34.0 pg   MCHC 31.1  30.0 - 36.0 g/dL   RDW 14.9  11.5 - 15.5 %   Platelets 140 (*) 150 - 400 K/uL   Neutrophils Relative % 52  43 - 77 %   Neutro Abs 2.2  1.7 - 7.7 K/uL   Lymphocytes Relative 29  12 - 46 %   Lymphs Abs 1.2  0.7 - 4.0 K/uL   Monocytes Relative 13 (*) 3 - 12 %   Monocytes Absolute 0.5  0.1 - 1.0 K/uL   Eosinophils Relative 5  0 - 5 %   Eosinophils Absolute 0.2  0.0 - 0.7 K/uL   Basophils Relative 1  0 - 1 %   Basophils Absolute 0.0  0.0 - 0.1 K/uL  COMPREHENSIVE METABOLIC PANEL      Result Value Ref Range   Sodium 137  137 - 147 mEq/L   Potassium 4.2  3.7 - 5.3 mEq/L   Chloride 99  96 - 112 mEq/L   CO2 26  19 - 32 mEq/L   Glucose, Bld 76  70 - 99 mg/dL   BUN 12  6 - 23 mg/dL   Creatinine, Ser 0.86  0.50 - 1.10 mg/dL   Calcium 8.8  8.4 - 10.5 mg/dL   Total Protein 7.2  6.0 - 8.3 g/dL   Albumin 3.0 (*) 3.5 - 5.2 g/dL   AST 15  0 - 37 U/L   ALT 8  0 - 35 U/L   Alkaline Phosphatase 107  39 - 117 U/L   Total Bilirubin 0.5  0.3 - 1.2 mg/dL   GFR calc non Af Amer 83 (*) >90 mL/min   GFR calc Af Amer >90  >90 mL/min  PROTIME-INR      Result Value Ref Range   Prothrombin Time 16.9 (*) 11.6 - 15.2 seconds   INR 1.41  0.00 - 1.49  APTT      Result Value Ref Range   aPTT 57 (*) 24 - 37 seconds  URINALYSIS, ROUTINE W REFLEX MICROSCOPIC      Result Value Ref Range   Color, Urine YELLOW  YELLOW   APPearance CLEAR  CLEAR   Specific Gravity, Urine 1.010  1.005 - 1.030   pH 5.5  5.0 - 8.0   Glucose, UA 250 (*) NEGATIVE mg/dL   Hgb urine dipstick NEGATIVE  NEGATIVE   Bilirubin Urine NEGATIVE  NEGATIVE   Ketones, ur NEGATIVE  NEGATIVE mg/dL   Protein, ur NEGATIVE  NEGATIVE mg/dL    Urobilinogen, UA 0.2  0.0 - 1.0 mg/dL   Nitrite NEGATIVE  NEGATIVE   Leukocytes, UA NEGATIVE  NEGATIVE  URINE RAPID DRUG SCREEN (HOSP PERFORMED)      Result Value Ref Range   Opiates NONE DETECTED  NONE DETECTED   Cocaine NONE DETECTED  NONE DETECTED   Benzodiazepines POSITIVE (*) NONE DETECTED   Amphetamines NONE DETECTED  NONE DETECTED   Tetrahydrocannabinol NONE DETECTED  NONE DETECTED   Barbiturates NONE DETECTED  NONE DETECTED   MDM   Final diagnoses:  Pain of left leg  Pain of right leg  Peripheral edema  DVT (deep vein thrombosis) in pregnancy  Lupus anticoagulant disorder  Chronic pain  Patient noncompliant with anticoagulant medication    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 Somnolence of uncertain cause. Drug screen has come back positive for benzodiazepines and the patient does have Klonopin listed among her medications. Initially, patient stated that she had been  seen here yesterday and diagnosed with a DVT but there is no record of a visit here on that date. Then she states that she actually was seen at [facility]. Old records are reviewed and she was seen in April 1 and bedtime, CT of the abdomen and pelvis showed inferior vena cava filter in place and venous clots present in the distal inferior vena cava and iliac veins. Patient tried to tell me that her IVC filter was no longer functioning but clearly it was still functional. She was given a dose of Xarelto. With IVC filter in place, and known clots present, there is no indication for further workup of same nor any reason to look for new pulmonary emboli. Patient continued to be very somnolent and she was observed in the ED for several hours. She then woke up from a complaint that she needed to have pain medication. She states that she had run out of all of her the medication. Record on [place] controlled substance reporting website was reviewed and she did not have any narcotic prescriptions filled  since March 26 at which time she had 12 Percocet tablets filled. She was referred back to her PCP and she was given a take-home pack of 6 oxycodone-acetaminophen. Because of her peripheral edema, she was given a prescription for furosemide. She is advised that she should obtain all of her narcotic prescriptions from her PCP and that she needed to discuss a referral to pain management. Advised that she needed to make sure that she took her Xarelto every day and that any day she misses a dose, her clots could grow.  I personally performed the services described in this documentation, which was scribed in my presence. The recorded information has been reviewed and is accurate.      Delora Fuel, MD [id number]

## 2013-05-26 NOTE — ED Notes (Signed)
Per EMS, patient was seen here yesterday and diagnosed with bilateral DVTs.  Patient presents tonight with continued pain.  Patient is drowsy and told EMS that her depression meds are kicking in and making her sleepy.  Patient began crying and yelling that she was hurting when EMS was getting her to bed.

## 2013-05-27 ENCOUNTER — Ambulatory Visit (HOSPITAL_COMMUNITY): Payer: Medicaid Other

## 2013-05-27 DIAGNOSIS — K909 Intestinal malabsorption, unspecified: Secondary | ICD-10-CM | POA: Insufficient documentation

## 2013-05-27 DIAGNOSIS — K632 Fistula of intestine: Secondary | ICD-10-CM | POA: Insufficient documentation

## 2013-05-27 LAB — RAPID URINE DRUG SCREEN, HOSP PERFORMED
Amphetamines: NOT DETECTED
BENZODIAZEPINES: POSITIVE — AB
Barbiturates: NOT DETECTED
COCAINE: NOT DETECTED
Opiates: NOT DETECTED
TETRAHYDROCANNABINOL: NOT DETECTED

## 2013-05-27 LAB — COMPREHENSIVE METABOLIC PANEL
ALT: 8 U/L (ref 0–35)
AST: 15 U/L (ref 0–37)
Albumin: 3 g/dL — ABNORMAL LOW (ref 3.5–5.2)
Alkaline Phosphatase: 107 U/L (ref 39–117)
BILIRUBIN TOTAL: 0.5 mg/dL (ref 0.3–1.2)
BUN: 12 mg/dL (ref 6–23)
CALCIUM: 8.8 mg/dL (ref 8.4–10.5)
CO2: 26 meq/L (ref 19–32)
Chloride: 99 mEq/L (ref 96–112)
Creatinine, Ser: 0.86 mg/dL (ref 0.50–1.10)
GFR, EST NON AFRICAN AMERICAN: 83 mL/min — AB (ref >90)
GLUCOSE: 76 mg/dL (ref 70–99)
Potassium: 4.2 mEq/L (ref 3.7–5.3)
SODIUM: 137 meq/L (ref 137–147)
Total Protein: 7.2 g/dL (ref 6.0–8.3)

## 2013-05-27 LAB — PROTIME-INR
INR: 1.41 (ref 0.00–1.49)
Prothrombin Time: 16.9 seconds — ABNORMAL HIGH (ref 11.6–15.2)

## 2013-05-27 LAB — URINALYSIS, ROUTINE W REFLEX MICROSCOPIC
BILIRUBIN URINE: NEGATIVE
Glucose, UA: 250 mg/dL — AB
Hgb urine dipstick: NEGATIVE
Ketones, ur: NEGATIVE mg/dL
Leukocytes, UA: NEGATIVE
NITRITE: NEGATIVE
PH: 5.5 (ref 5.0–8.0)
Protein, ur: NEGATIVE mg/dL
Specific Gravity, Urine: 1.01 (ref 1.005–1.030)
Urobilinogen, UA: 0.2 mg/dL (ref 0.0–1.0)

## 2013-05-27 LAB — APTT: APTT: 57 s — AB (ref 24–37)

## 2013-05-27 MED ORDER — OXYCODONE-ACETAMINOPHEN 5-325 MG PO TABS: 1.0000 | ORAL_TABLET | ORAL | Status: DC | PRN

## 2013-05-27 MED ORDER — FUROSEMIDE 20 MG PO TABS: 20.0000 mg | ORAL_TABLET | Freq: Every day | ORAL | Status: DC

## 2013-05-27 NOTE — Discharge Instructions (Signed)
If you need to take your Xarelto every day. If you miss a day, blood clots will form and well. Your leg swelling is because of blood clots which are blocking the flow of blood back to your heart.   Talk with your PCP about referral to a pain management clinic.  Peripheral Edema You have swelling in your legs (peripheral edema). This swelling is due to excess accumulation of salt and water in your body. Edema may be a sign of heart, kidney or liver disease, or a side effect of a medication. It may also be due to problems in the leg veins. Elevating your legs and using special support stockings may be very helpful, if the cause of the swelling is due to poor venous circulation. Avoid long periods of standing, whatever the cause. Treatment of edema depends on identifying the cause. Chips, pretzels, pickles and other salty foods should be avoided. Restricting salt in your diet is almost always needed. Water pills (diuretics) are often used to remove the excess salt and water from your body via urine. These medicines prevent the kidney from reabsorbing sodium. This increases urine flow. Diuretic treatment may also result in lowering of potassium levels in your body. Potassium supplements may be needed if you have to use diuretics daily. Daily weights can help you keep track of your progress in clearing your edema. You should call your caregiver for follow up care as recommended. SEEK IMMEDIATE MEDICAL CARE IF:   You have increased swelling, pain, redness, or heat in your legs.  You develop shortness of breath, especially when lying down.  You develop chest or abdominal pain, weakness, or fainting.  You have a fever. Document Released: 03/14/2004 Document Revised: 04/29/2011 Document Reviewed: 02/22/2009 [facility] Patient Information 2014 [place].  Acetaminophen; Oxycodone tablets What is this medicine? ACETAMINOPHEN; OXYCODONE (a set a MEE noe fen; ox i KOE done) is a pain reliever. It is  used to treat mild to moderate pain. This medicine may be used for other purposes; ask your health care provider or pharmacist if you have questions. COMMON BRAND NAME(S): Endocet, Magnacet, Narvox, Percocet, Perloxx, Primalev, Primlev, Roxicet, Xolox What should I tell my health care provider before I take this medicine? They need to know if you have any of these conditions: -brain tumor -Crohn's disease, inflammatory bowel disease, or ulcerative colitis -drug abuse or addiction -head injury -heart or circulation problems -if you often drink alcohol -kidney disease or problems going to the bathroom -liver disease -lung disease, asthma, or breathing problems -an unusual or allergic reaction to acetaminophen, oxycodone, other opioid analgesics, other medicines, foods, dyes, or preservatives -pregnant or trying to get pregnant -breast-feeding How should I use this medicine? Take this medicine by mouth with a full glass of water. Follow the directions on the prescription label. Take your medicine at regular intervals. Do not take your medicine more often than directed. Talk to your pediatrician regarding the use of this medicine in children. Special care may be needed. Patients over 42 years old may have a stronger reaction and need a smaller dose. Overdosage: If you think you have taken too much of this medicine contact a poison control center or emergency room at once. NOTE: This medicine is only for you. Do not share this medicine with others. What if I miss a dose? If you miss a dose, take it as soon as you can. If it is almost time for your next dose, take only that dose. Do not take double or  extra doses. What may interact with this medicine? -alcohol -antihistamines -barbiturates like amobarbital, butalbital, butabarbital, methohexital, pentobarbital, phenobarbital, thiopental, and secobarbital -benztropine -drugs for bladder problems like solifenacin, trospium, oxybutynin,  tolterodine, hyoscyamine, and methscopolamine -drugs for breathing problems like ipratropium and tiotropium -drugs for certain stomach or intestine problems like propantheline, homatropine methylbromide, glycopyrrolate, atropine, belladonna, and dicyclomine -general anesthetics like etomidate, ketamine, nitrous oxide, propofol, desflurane, enflurane, halothane, isoflurane, and sevoflurane -medicines for depression, anxiety, or psychotic disturbances -medicines for sleep -muscle relaxants -naltrexone -narcotic medicines (opiates) for pain -phenothiazines like perphenazine, thioridazine, chlorpromazine, mesoridazine, fluphenazine, prochlorperazine, promazine, and trifluoperazine -scopolamine -tramadol -trihexyphenidyl This list may not describe all possible interactions. Give your health care provider a list of all the medicines, herbs, non-prescription drugs, or dietary supplements you use. Also tell them if you smoke, drink alcohol, or use illegal drugs. Some items may interact with your medicine. What should I watch for while using this medicine? Tell your doctor or health care professional if your pain does not go away, if it gets worse, or if you have new or a different type of pain. You may develop tolerance to the medicine. Tolerance means that you will need a higher dose of the medication for pain relief. Tolerance is normal and is expected if you take this medicine for a long time. Do not suddenly stop taking your medicine because you may develop a severe reaction. Your body becomes used to the medicine. This does NOT mean you are addicted. Addiction is a behavior related to getting and using a drug for a non-medical reason. If you have pain, you have a medical reason to take pain medicine. Your doctor will tell you how much medicine to take. If your doctor wants you to stop the medicine, the dose will be slowly lowered over time to avoid any side effects. You may get drowsy or dizzy. Do not  drive, use machinery, or do anything that needs mental alertness until you know how this medicine affects you. Do not stand or sit up quickly, especially if you are an older patient. This reduces the risk of dizzy or fainting spells. Alcohol may interfere with the effect of this medicine. Avoid alcoholic drinks. There are different types of narcotic medicines (opiates) for pain. If you take more than one type at the same time, you may have more side effects. Give your health care provider a list of all medicines you use. Your doctor will tell you how much medicine to take. Do not take more medicine than directed. Call emergency for help if you have problems breathing. The medicine will cause constipation. Try to have a bowel movement at least every 2 to 3 days. If you do not have a bowel movement for 3 days, call your doctor or health care professional. Do not take Tylenol (acetaminophen) or medicines that have acetaminophen with this medicine. Too much acetaminophen can be very dangerous. Many nonprescription medicines contain acetaminophen. Always read the labels carefully to avoid taking more acetaminophen. What side effects may I notice from receiving this medicine? Side effects that you should report to your doctor or health care professional as soon as possible: -allergic reactions like skin rash, itching or hives, swelling of the face, lips, or tongue -breathing difficulties, wheezing -confusion -light headedness or fainting spells -severe stomach pain -unusually weak or tired -yellowing of the skin or the whites of the eyes  Side effects that usually do not require medical attention (report to your doctor or health care professional if they  continue or are bothersome): -dizziness -drowsiness -nausea -vomiting This list may not describe all possible side effects. Call your doctor for medical advice about side effects. You may report side effects to FDA at 1-800-FDA-1088. Where should I keep  my medicine? Keep out of the reach of children. This medicine can be abused. Keep your medicine in a safe place to protect it from theft. Do not share this medicine with anyone. Selling or giving away this medicine is dangerous and against the law. Store at room temperature between 20 and 25 degrees C (68 and 77 degrees F). Keep container tightly closed. Protect from light. This medicine may cause accidental overdose and death if it is taken by other adults, children, or pets. Flush any unused medicine down the toilet to reduce the chance of harm. Do not use the medicine after the expiration date. NOTE: This sheet is a summary. It may not cover all possible information. If you have questions about this medicine, talk to your doctor, pharmacist, or health care provider.  2014, [employer] Standard. (2012-09-28 13:17:35)  Furosemide tablets What is this medicine? FUROSEMIDE (fyoor [place] se mide) is a diuretic. It helps you make more urine and to lose salt and excess water from your body. This medicine is used to treat high blood pressure, and edema or swelling from heart, kidney, or liver disease. This medicine may be used for other purposes; ask your health care provider or pharmacist if you have questions. COMMON BRAND NAME(S): Delone , Lasix What should I tell my health care provider before I take this medicine? They need to know if you have any of these conditions: -abnormal blood electrolytes -diarrhea or vomiting -gout -heart disease -kidney disease, small amounts of urine, or difficulty passing urine -liver disease -an unusual or allergic reaction to furosemide, sulfa drugs, other medicines, foods, dyes, or preservatives -pregnant or trying to get pregnant -breast-feeding How should I use this medicine? Take this medicine by mouth with a glass of water. Follow the directions on the prescription label. You may take this medicine with or without food. If it upsets your stomach, take it  with food or milk. Do not take your medicine more often than directed. Remember that you will need to pass more urine after taking this medicine. Do not take your medicine at a time of day that will cause you problems. Do not take at bedtime. Talk to your pediatrician regarding the use of this medicine in children. While this drug may be prescribed for selected conditions, precautions do apply. Overdosage: If you think you have taken too much of this medicine contact a poison control center or emergency room at once. NOTE: This medicine is only for you. Do not share this medicine with others. What if I miss a dose? If you miss a dose, take it as soon as you can. If it is almost time for your next dose, take only that dose. Do not take double or extra doses. What may interact with this medicine? -aspirin and aspirin-like medicines -certain antibiotics -chloral hydrate -cisplatin -cyclosporine -digoxin -diuretics -laxatives -lithium -medicines for blood pressure -medicines that relax muscles for surgery -methotrexate -NSAIDs, medicines for pain and inflammation like ibuprofen, naproxen, or indomethacin -phenytoin -steroid medicines like prednisone or cortisone -sucralfate This list may not describe all possible interactions. Give your health care provider a list of all the medicines, herbs, non-prescription drugs, or dietary supplements you use. Also tell them if you smoke, drink alcohol, or use illegal drugs. Some items may  interact with your medicine. What should I watch for while using this medicine? Visit your doctor or health care professional for regular checks on your progress. Check your blood pressure regularly. Ask your doctor or health care professional what your blood pressure should be, and when you should contact him or her. If you are a diabetic, check your blood sugar as directed. You may need to be on a special diet while taking this medicine. Check with your doctor. Also,  ask how many glasses of fluid you need to drink a day. You must not get dehydrated. You may get drowsy or dizzy. Do not drive, use machinery, or do anything that needs mental alertness until you know how this drug affects you. Do not stand or sit up quickly, especially if you are an older patient. This reduces the risk of dizzy or fainting spells. Alcohol can make you more drowsy and dizzy. Avoid alcoholic drinks. This medicine can make you more sensitive to the sun. Keep out of the sun. If you cannot avoid being in the sun, wear protective clothing and use sunscreen. Do not use sun lamps or tanning beds/booths. What side effects may I notice from receiving this medicine? Side effects that you should report to your doctor or health care professional as soon as possible: -blood in urine or stools -dry mouth -fever or chills -hearing loss or ringing in the ears -irregular heartbeat -muscle pain or weakness, cramps -skin rash -stomach upset, pain, or nausea -tingling or numbness in the hands or feet -unusually weak or tired -vomiting or diarrhea -yellowing of the eyes or skin Side effects that usually do not require medical attention (report to your doctor or health care professional if they continue or are bothersome): -headache -loss of appetite -unusual bleeding or bruising This list may not describe all possible side effects. Call your doctor for medical advice about side effects. You may report side effects to FDA at 1-800-FDA-1088. Where should I keep my medicine? Keep out of the reach of children. Store at room temperature between 15 and 30 degrees C (59 and 86 degrees F). Protect from light. Throw away any unused medicine after the expiration date. NOTE: This sheet is a summary. It may not cover all possible information. If you have questions about this medicine, talk to your doctor, pharmacist, or health care provider.  2014, [employer]vier/Gold Standard. (2009-01-23 16:24:50)

## 2013-05-28 NOTE — Progress Notes (Signed)
This encounter was created in error - please disregard.

## 2013-06-01 ENCOUNTER — Encounter (HOSPITAL_COMMUNITY): Payer: Self-pay

## 2013-11-20 DIAGNOSIS — I2510 Atherosclerotic heart disease of native coronary artery without angina pectoris: Secondary | ICD-10-CM | POA: Insufficient documentation

## 2014-03-09 DIAGNOSIS — G373 Acute transverse myelitis in demyelinating disease of central nervous system: Secondary | ICD-10-CM | POA: Insufficient documentation

## 2014-09-20 ENCOUNTER — Emergency Department
Admission: EM | Admit: 2014-09-20 | Discharge: 2014-09-20 | Disposition: A | Discharge status: Home / Self Care | Payer: Medicaid Other | Attending: Emergency Medicine | Admitting: Emergency Medicine

## 2014-09-20 ENCOUNTER — Emergency Department: Payer: Medicaid Other

## 2014-09-20 ENCOUNTER — Encounter: Payer: Self-pay | Admitting: Emergency Medicine

## 2014-09-20 DIAGNOSIS — Z79899 Other long term (current) drug therapy: Secondary | ICD-10-CM | POA: Diagnosis not present

## 2014-09-20 DIAGNOSIS — Z880 Allergy status to penicillin: Secondary | ICD-10-CM | POA: Diagnosis not present

## 2014-09-20 DIAGNOSIS — Z7901 Long term (current) use of anticoagulants: Secondary | ICD-10-CM | POA: Insufficient documentation

## 2014-09-20 DIAGNOSIS — J189 Pneumonia, unspecified organism: Secondary | ICD-10-CM

## 2014-09-20 DIAGNOSIS — J159 Unspecified bacterial pneumonia: Secondary | ICD-10-CM | POA: Insufficient documentation

## 2014-09-20 DIAGNOSIS — Z7951 Long term (current) use of inhaled steroids: Secondary | ICD-10-CM | POA: Insufficient documentation

## 2014-09-20 DIAGNOSIS — R079 Chest pain, unspecified: Secondary | ICD-10-CM | POA: Diagnosis not present

## 2014-09-20 DIAGNOSIS — G894 Chronic pain syndrome: Secondary | ICD-10-CM | POA: Diagnosis not present

## 2014-09-20 DIAGNOSIS — Z91040 Latex allergy status: Secondary | ICD-10-CM | POA: Insufficient documentation

## 2014-09-20 HISTORY — DX: Heart failure, unspecified: I50.9

## 2014-09-20 HISTORY — DX: Unspecified asthma, uncomplicated: J45.909

## 2014-09-20 HISTORY — DX: Systemic involvement of connective tissue, unspecified: M35.9

## 2014-09-20 LAB — BASIC METABOLIC PANEL
Anion gap: 5 (ref 5–15)
BUN: 12 mg/dL (ref 6–20)
CALCIUM: 8.3 mg/dL — AB (ref 8.9–10.3)
CO2: 27 mmol/L (ref 22–32)
Chloride: 108 mmol/L (ref 101–111)
Creatinine, Ser: 0.73 mg/dL (ref 0.44–1.00)
GFR calc Af Amer: >60 mL/min (ref >60)
GFR calc non Af Amer: >60 mL/min (ref >60)
GLUCOSE: 75 mg/dL (ref 65–99)
POTASSIUM: 3.9 mmol/L (ref 3.5–5.1)
SODIUM: 140 mmol/L (ref 135–145)

## 2014-09-20 LAB — CBC
HEMATOCRIT: 32.1 % — AB (ref 35.0–47.0)
HEMOGLOBIN: 10.5 g/dL — AB (ref 12.0–16.0)
MCH: 28.9 pg (ref 26.0–34.0)
MCHC: 32.8 g/dL (ref 32.0–36.0)
MCV: 87.9 fL (ref 80.0–100.0)
Platelets: 134 10*3/uL — ABNORMAL LOW (ref 150–440)
RBC: 3.65 MIL/uL — AB (ref 3.80–5.20)
RDW: 17.6 % — ABNORMAL HIGH (ref 11.5–14.5)
WBC: 2.5 10*3/uL — ABNORMAL LOW (ref 3.6–11.0)

## 2014-09-20 LAB — HEPATIC FUNCTION PANEL
ALT: 34 U/L (ref 14–54)
AST: 24 U/L (ref 15–41)
Albumin: 2.8 g/dL — ABNORMAL LOW (ref 3.5–5.0)
Alkaline Phosphatase: 141 U/L — ABNORMAL HIGH (ref 38–126)
BILIRUBIN INDIRECT: 0.1 mg/dL — AB (ref 0.3–0.9)
BILIRUBIN TOTAL: 0.3 mg/dL (ref 0.3–1.2)
Bilirubin, Direct: 0.2 mg/dL (ref 0.1–0.5)
Total Protein: 5.7 g/dL — ABNORMAL LOW (ref 6.5–8.1)

## 2014-09-20 LAB — FIBRIN DERIVATIVES D-DIMER (ARMC ONLY): Fibrin derivatives D-dimer (ARMC): 626 — ABNORMAL HIGH (ref 0–499)

## 2014-09-20 LAB — TROPONIN I

## 2014-09-20 LAB — LIPASE, BLOOD: Lipase: 13 U/L — ABNORMAL LOW (ref 22–51)

## 2014-09-20 MED ORDER — MORPHINE SULFATE 4 MG/ML IJ SOLN
4.0000 mg | Freq: Once | INTRAMUSCULAR | Status: AC
  Administered 2014-09-20: 4 mg via INTRAVENOUS
  Filled 2014-09-20: qty 1

## 2014-09-20 MED ORDER — DOXYCYCLINE HYCLATE 100 MG PO TABS: 100.0000 mg | ORAL_TABLET | Freq: Two times a day (BID) | ORAL | Status: DC

## 2014-09-20 MED ORDER — OXYCODONE-ACETAMINOPHEN 5-325 MG PO TABS
ORAL_TABLET | ORAL | Status: AC
  Administered 2014-09-20: 1 via ORAL
  Filled 2014-09-20: qty 1

## 2014-09-20 MED ORDER — MORPHINE SULFATE 4 MG/ML IJ SOLN
INTRAMUSCULAR | Status: AC
  Administered 2014-09-20: 4 mg
  Filled 2014-09-20: qty 1

## 2014-09-20 MED ORDER — IOHEXOL 350 MG/ML SOLN
100.0000 mL | Freq: Once | INTRAVENOUS | Status: AC | PRN
  Administered 2014-09-20: 100 mL via INTRAVENOUS

## 2014-09-20 MED ORDER — OXYCODONE-ACETAMINOPHEN 5-325 MG PO TABS
1.0000 | ORAL_TABLET | Freq: Once | ORAL | Status: AC
  Administered 2014-09-20: 1 via ORAL

## 2014-09-20 MED ORDER — ROXICET 5-325 MG PO TABS: 1.0000 | ORAL_TABLET | Freq: Four times a day (QID) | ORAL | Status: DC | PRN

## 2014-09-20 MED ORDER — ONDANSETRON HCL 4 MG/2ML IJ SOLN
INTRAMUSCULAR | Status: AC
  Administered 2014-09-20: 4 mg
  Filled 2014-09-20: qty 2

## 2014-09-20 NOTE — ED Provider Notes (Signed)
[facility] Emergency Department Provider Note  Time seen: 4:35 PM  I have reviewed the triage vital signs and the nursing notes.   HISTORY  Chief Complaint Chest Pain    HPI Leslie Marks is a 44 y.o. female with a past medical history of depression, pulmonary emboli, chronic pain, fibromyalgia, chronic anxiety, abdominal pain, migraines, myocardial infarction 3, CVA 1, DVT history, presents the emergency department with 10/10 central to right-sided chest pain that began yesterday. According to the patient for the past 2 days she has had 10/10 chest pain that has remained constant, located in the central to right chest. Patient describes the pain as sharp, no worse with deep inspiration. The pain does become worse when she eats or moves. States nausea but denies diaphoresis or vomiting. Denies shortness of breath. Patient does state some mild leg swelling of the left lower extremity, denies any pain in the legs.States the pain continues to be a 10/10.     Past Medical History  Diagnosis Date  . Depression   . Chronic anticoagulation   . Pulmonary emboli 2007  . Chronic pain syndrome   . Chronic anxiety   . Non-diabetic hypoglycemia   . Chronic abdominal pain   . Lumbago   . Migraine   . Iron deficiency anemia   . B12 deficiency   . Fibromyalgia   . GERD (gastroesophageal reflux disease)   . Hypoglycemia   . Vitamin D deficiency   . Encephalopathy   . Opiate dependence   . Benzodiazepine dependence   . Overdose   . DVT (deep venous thrombosis)     Patient Active Problem List   Diagnosis Date Noted  . 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 Malabsorption syndrome 05/27/2013  . Cholelithiasis 05/12/2013  . Lupus anticoagulant positive 04/29/2013  . 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 Altered mental status 04/27/2013  . Pulmonary emboli 04/27/2013  . Chronic pain syndrome 04/27/2013  .  Chronic anxiety 04/27/2013  . Non-diabetic hypoglycemia 04/27/2013  . Acute encephalopathy 04/26/2013  . Depression 04/26/2013  . Chronic anticoagulation 04/26/2013  . Pancytopenia 04/26/2013    Past Surgical History  Procedure Laterality Date  . Gastric bypass  2003  . Cesarean section    . Appendectomy    . Tonsillectomy    . Abdominal hysterectomy      Current Outpatient Rx  Name  Route  Sig  Dispense  Refill  . bisacodyl (DULCOLAX) 5 MG EC tablet   Oral   Take 10 mg by mouth daily. To be taken on an empty stomach         . carisoprodol (SOMA) 350 MG tablet   Oral   Take 1 tablet (350 mg total) by mouth 3 (three) times daily.   15 tablet   0   . Cholecalciferol (VITAMIN D) 2000 UNITS CAPS   Oral   Take 2,000 Units by mouth daily.         . clonazePAM (KLONOPIN) 2 MG tablet   Oral   Take 2 mg by mouth 3 (three) times daily.         . cloNIDine (CATAPRES) 0.1 MG tablet   Oral   Take 0.1 mg by mouth every 8 (eight) hours.         . cyclobenzaprine (FLEXERIL) 10 MG tablet   Oral   Take 10 mg by mouth 3 (three) times daily.         Marland Kitchen  docusate sodium (COLACE) 100 MG capsule   Oral   Take 100 mg by mouth 2 (two) times daily.         Marland Kitchen esomeprazole (NEXIUM) 40 MG capsule   Oral   Take 40 mg by mouth 2 (two) times daily.         . fluticasone (FLONASE) 50 MCG/ACT nasal spray   Each Nare   Place 1 spray into both nostrils daily.         . furosemide (LASIX) 20 MG tablet   Oral   Take 1 tablet (20 mg total) by mouth daily.   30 tablet   0   . gabapentin (NEURONTIN) 300 MG capsule   Oral   Take 600 mg by mouth daily.         Marland Kitchen gabapentin (NEURONTIN) 400 MG capsule   Oral   Take 1,200 mg by mouth 2 (two) times daily.         Marland Kitchen gabapentin (NEURONTIN) 600 MG tablet   Oral   Take 1,200 mg by mouth 3 (three) times daily.         Marland Kitchen glucagon (GLUCAGEN HYPOKIT) 1 MG SOLR injection   Intravenous   Inject 1 mg into the vein once as  needed for low blood sugar.         . hydrOXYzine (ATARAX/VISTARIL) 25 MG tablet   Oral   Take 1 tablet (25 mg total) by mouth 3 (three) times daily as needed for anxiety or itching.   12 tablet   0   . Liniments (ANALGESIC BALM EX)   Apply externally   Apply 1 application topically 4 (four) times daily as needed (for pain).         . magnesium oxide (MAG-OX) 400 MG tablet   Oral   Take 400 mg by mouth 3 (three) times daily.         . methadone (DOLOPHINE) 10 MG tablet   Oral   Take 60 mg by mouth 2 (two) times daily.         . methocarbamol (ROBAXIN) 750 MG tablet   Oral   Take 750 mg by mouth 3 (three) times daily.         . mometasone (NASONEX) 50 MCG/ACT nasal spray   Nasal   Place 2 sprays into the nose daily.         Marland Kitchen OLANZapine (ZYPREXA) 10 MG tablet   Oral   Take 10 mg by mouth at bedtime.         Marland Kitchen OLANZapine zydis (ZYPREXA) 10 MG disintegrating tablet   Oral   Take 10 mg by mouth at bedtime.         Marland Kitchen omeprazole (PRILOSEC) 20 MG capsule   Oral   Take 40 mg by mouth daily.         . ondansetron (ZOFRAN) 4 MG tablet   Oral   Take 8 mg by mouth 3 (three) times daily as needed for nausea or vomiting.         Marland Kitchen oxyCODONE (OXY IR/ROXICODONE) 5 MG immediate release tablet   Oral   Take 1 tablet (5 mg total) by mouth every 6 (six) hours as needed for moderate pain.   15 tablet   0   . OxyCODONE (OXYCONTIN) 20 mg T12A 12 hr tablet   Oral   Take 1 tablet (20 mg total) by mouth every 12 (twelve) hours.   8 tablet   0   . OxyCODONE  HCl ER (OXYCONTIN) 30 MG T12A   Oral   Take 30 mg by mouth every 12 (twelve) hours.         Marland Kitchen oxyCODONE-acetaminophen (PERCOCET/ROXICET) 5-325 MG per tablet   Oral   Take 1 tablet by mouth every 4 (four) hours as needed.   15 tablet   0   . pantoprazole (PROTONIX) 40 MG tablet   Oral   Take 40 mg by mouth daily.         . propranolol (INDERAL) 40 MG tablet   Oral   Take 40 mg by mouth 3 (three)  times daily.         . Rivaroxaban (XARELTO) 20 MG TABS tablet   Oral   Take 20 mg by mouth daily with supper.         . thiamine (VITAMIN B-1) 100 MG tablet   Oral   Take 100 mg by mouth 2 (two) times daily.         . traZODone (DESYREL) 100 MG tablet   Oral   Take 200 mg by mouth at bedtime.         . traZODone (DESYREL) 100 MG tablet   Oral   Take 200 mg by mouth at bedtime.         . vitamin B-12 (CYANOCOBALAMIN) 1000 MCG tablet   Oral   Take 1,000 mcg by mouth daily.         Marland Kitchen zolpidem (AMBIEN) 10 MG tablet   Oral   Take 10 mg by mouth at bedtime.           Allergies Amoxicillin; Augmentin; Betadine; Ciprofloxacin; Erythromycin; Latex; Penicillins; Adhesive; and Lyrica  History reviewed. No pertinent family history.  Social History History  Substance Use Topics  . Smoking status: Never Smoker   . Smokeless tobacco: Not on file  . Alcohol Use: No    Review of Systems Constitutional: Negative for fever. Cardiovascular: Positive for chest pain. Respiratory: Negative for shortness of breath. Gastrointestinal: Negative for abdominal pain Musculoskeletal: Negative for back pain. Neurological: Negative for headache 10-point ROS otherwise negative.  ____________________________________________   PHYSICAL EXAM:  VITAL SIGNS: ED Triage Vitals  Enc Vitals Group     BP 09/20/14 1602 108/76 mmHg     Pulse Rate 09/20/14 1602 74     Resp 09/20/14 1602 20     Temp 09/20/14 1602 98.4 F (36.9 C)     Temp Source 09/20/14 1602 Oral     SpO2 09/20/14 1602 97 %     Weight 09/20/14 1602 165 lb (74.844 kg)     Height 09/20/14 1602 5\' 5"  (1.651 m)     Head Cir --      Peak Flow --      Pain Score 09/20/14 1603 10     Pain Loc --      Pain Edu? --      Excl. in [place]? --     Constitutional: Alert and oriented. Well appearing Eyes: Normal exam ENT   Mouth/Throat: Mucous membranes are moist. Cardiovascular: Normal rate, regular rhythm. No murmur.  No reproducible pain on exam. Respiratory: Normal respiratory effort without tachypnea nor retractions. Breath sounds are clear Gastrointestinal: Soft and nontender. No distention.   Musculoskeletal: Nontender with normal range of motion in all extremities. No lower extremity tenderness or edema. Neurologic:  Normal speech and language. No gross focal neurologic deficits Skin:  Skin is warm, dry and intact.  Psychiatric: Mood and affect are normal. Speech  and behavior are normal. ____________________________________________    EKG  EKG reviewed and interpreted by myself shows normal sinus rhythm at 72 bpm. Narrow QRS, normal axis, normal intervals, nonspecific but no concerning ST changes noted.  ____________________________________________    RADIOLOGY  CT angiogram chest is negative for PE.  ____________________________________________    INITIAL IMPRESSION / ASSESSMENT AND PLAN / ED COURSE  Pertinent labs & imaging results that were available during my care of the patient were reviewed by me and considered in my medical decision making (see chart for details).  Patient with a very complicated past medical history who presents for 10/10 chest pain. We'll proceed with labs including d-dimer, troponin, chest x-ray, while closely monitoring in the emergency department. Patient does state mild increase when she eats, however she has no upper abdominal pain, specifically no right upper quadrant abdominal tenderness to palpation.  CT angios negative for PE. Patient labs are otherwise within normal limits. LFTs within normal limits, besides mildly elevated alkaline phosphatase again no right upper quadrant tenderness to palpation. CT does show a slight consolidation in the left lung base. We will prescribe Z-Pak, and have the patient follow up with her primary care doctor.  ____________________________________________   FINAL CLINICAL IMPRESSION(S) / ED DIAGNOSES  Chest  pain Pneumonia   Harvest Dark, MD 09/20/14 [phone]

## 2014-09-20 NOTE — Discharge Instructions (Signed)
Pneumonia, Adult  Pneumonia is an infection of the lungs. It may be caused by a germ (virus or bacteria). Some types of pneumonia can spread easily from person to person. This can happen when you cough or sneeze.  HOME CARE   Only take medicine as told by your doctor.   Take your medicine (antibiotics) as told. Finish it even if you start to feel better.   Do not smoke.   You may use a vaporizer or humidifier in your room. This can help loosen thick spit (mucus).   Sleep so you are almost sitting up (semi-upright). This helps reduce coughing.   Rest.  A shot (vaccine) can help prevent pneumonia. Shots are often advised for:   People over 65 years old.   Patients on chemotherapy.   People with long-term (chronic) lung problems.   People with immune system problems.  GET HELP RIGHT AWAY IF:    You are getting worse.   You cannot control your cough, and you are losing sleep.   You cough up blood.   Your pain gets worse, even with medicine.   You have a fever.   Any of your problems are getting worse, not better.   You have shortness of breath or chest pain.  MAKE SURE YOU:    Understand these instructions.   Will watch your condition.   Will get help right away if you are not doing well or get worse.  Document Released: 07/24/2007 Document Revised: 04/29/2011 Document Reviewed: 04/27/2010  [employer] Patient Information 2015 [employer]. This information is not intended to replace advice given to you by your health care provider. Make sure you discuss any questions you have with your health care provider.

## 2014-09-20 NOTE — ED Notes (Signed)
Patient to ED via EMS from home with c/o chest pain, substernal radiating into right chest and down right arm. 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 Patient reports pain is worse with inspiration and worse today.

## 2014-09-20 NOTE — ED Notes (Signed)
Pt requesting more IV pain medication before discharged and a rx for the same.

## 2014-09-30 ENCOUNTER — Emergency Department: Payer: Medicaid Other

## 2014-09-30 ENCOUNTER — Encounter: Payer: Self-pay | Admitting: Internal Medicine

## 2014-09-30 ENCOUNTER — Observation Stay
Admission: EM | Admit: 2014-09-30 | Discharge: 2014-10-03 | Disposition: A | Discharge status: Home / Self Care | Payer: Medicaid Other | Attending: Internal Medicine | Admitting: Internal Medicine

## 2014-09-30 DIAGNOSIS — Z91048 Other nonmedicinal substance allergy status: Secondary | ICD-10-CM | POA: Insufficient documentation

## 2014-09-30 DIAGNOSIS — I313 Pericardial effusion (noninflammatory): Secondary | ICD-10-CM | POA: Diagnosis not present

## 2014-09-30 DIAGNOSIS — I509 Heart failure, unspecified: Secondary | ICD-10-CM | POA: Insufficient documentation

## 2014-09-30 DIAGNOSIS — Z9049 Acquired absence of other specified parts of digestive tract: Secondary | ICD-10-CM | POA: Diagnosis not present

## 2014-09-30 DIAGNOSIS — G92 Toxic encephalopathy: Secondary | ICD-10-CM | POA: Diagnosis not present

## 2014-09-30 DIAGNOSIS — R918 Other nonspecific abnormal finding of lung field: Secondary | ICD-10-CM | POA: Insufficient documentation

## 2014-09-30 DIAGNOSIS — K449 Diaphragmatic hernia without obstruction or gangrene: Secondary | ICD-10-CM | POA: Diagnosis not present

## 2014-09-30 DIAGNOSIS — Z86718 Personal history of other venous thrombosis and embolism: Secondary | ICD-10-CM | POA: Diagnosis not present

## 2014-09-30 DIAGNOSIS — T424X4A Poisoning by benzodiazepines, undetermined, initial encounter: Secondary | ICD-10-CM | POA: Diagnosis present

## 2014-09-30 DIAGNOSIS — F1110 Opioid abuse, uncomplicated: Secondary | ICD-10-CM

## 2014-09-30 DIAGNOSIS — K760 Fatty (change of) liver, not elsewhere classified: Secondary | ICD-10-CM | POA: Insufficient documentation

## 2014-09-30 DIAGNOSIS — I10 Essential (primary) hypertension: Secondary | ICD-10-CM | POA: Insufficient documentation

## 2014-09-30 DIAGNOSIS — Z86711 Personal history of pulmonary embolism: Secondary | ICD-10-CM | POA: Diagnosis not present

## 2014-09-30 DIAGNOSIS — Z7901 Long term (current) use of anticoagulants: Secondary | ICD-10-CM | POA: Diagnosis not present

## 2014-09-30 DIAGNOSIS — G894 Chronic pain syndrome: Secondary | ICD-10-CM | POA: Diagnosis not present

## 2014-09-30 DIAGNOSIS — Z888 Allergy status to other drugs, medicaments and biological substances status: Secondary | ICD-10-CM | POA: Insufficient documentation

## 2014-09-30 DIAGNOSIS — G9340 Encephalopathy, unspecified: Secondary | ICD-10-CM | POA: Diagnosis present

## 2014-09-30 DIAGNOSIS — I639 Cerebral infarction, unspecified: Secondary | ICD-10-CM

## 2014-09-30 DIAGNOSIS — T424X1A Poisoning by benzodiazepines, accidental (unintentional), initial encounter: Secondary | ICD-10-CM | POA: Diagnosis not present

## 2014-09-30 DIAGNOSIS — R4182 Altered mental status, unspecified: Secondary | ICD-10-CM | POA: Diagnosis present

## 2014-09-30 DIAGNOSIS — Z79899 Other long term (current) drug therapy: Secondary | ICD-10-CM | POA: Insufficient documentation

## 2014-09-30 DIAGNOSIS — Z91040 Latex allergy status: Secondary | ICD-10-CM | POA: Diagnosis not present

## 2014-09-30 DIAGNOSIS — E559 Vitamin D deficiency, unspecified: Secondary | ICD-10-CM | POA: Diagnosis not present

## 2014-09-30 DIAGNOSIS — F329 Major depressive disorder, single episode, unspecified: Secondary | ICD-10-CM | POA: Diagnosis not present

## 2014-09-30 DIAGNOSIS — Z7951 Long term (current) use of inhaled steroids: Secondary | ICD-10-CM | POA: Diagnosis not present

## 2014-09-30 DIAGNOSIS — M797 Fibromyalgia: Secondary | ICD-10-CM | POA: Diagnosis not present

## 2014-09-30 DIAGNOSIS — Z9884 Bariatric surgery status: Secondary | ICD-10-CM | POA: Diagnosis not present

## 2014-09-30 DIAGNOSIS — K219 Gastro-esophageal reflux disease without esophagitis: Secondary | ICD-10-CM | POA: Diagnosis not present

## 2014-09-30 DIAGNOSIS — R109 Unspecified abdominal pain: Secondary | ICD-10-CM | POA: Insufficient documentation

## 2014-09-30 DIAGNOSIS — Z880 Allergy status to penicillin: Secondary | ICD-10-CM | POA: Insufficient documentation

## 2014-09-30 DIAGNOSIS — R110 Nausea: Secondary | ICD-10-CM | POA: Diagnosis not present

## 2014-09-30 DIAGNOSIS — Z90710 Acquired absence of both cervix and uterus: Secondary | ICD-10-CM | POA: Diagnosis not present

## 2014-09-30 DIAGNOSIS — Z87892 Personal history of anaphylaxis: Secondary | ICD-10-CM | POA: Insufficient documentation

## 2014-09-30 DIAGNOSIS — Z79891 Long term (current) use of opiate analgesic: Secondary | ICD-10-CM | POA: Insufficient documentation

## 2014-09-30 DIAGNOSIS — R079 Chest pain, unspecified: Secondary | ICD-10-CM | POA: Insufficient documentation

## 2014-09-30 DIAGNOSIS — Y929 Unspecified place or not applicable: Secondary | ICD-10-CM | POA: Diagnosis not present

## 2014-09-30 DIAGNOSIS — E538 Deficiency of other specified B group vitamins: Secondary | ICD-10-CM | POA: Insufficient documentation

## 2014-09-30 DIAGNOSIS — Z881 Allergy status to other antibiotic agents status: Secondary | ICD-10-CM | POA: Diagnosis not present

## 2014-09-30 HISTORY — DX: Poisoning by benzodiazepines, accidental (unintentional), initial encounter: T42.4X1A

## 2014-09-30 LAB — ACETAMINOPHEN LEVEL

## 2014-09-30 LAB — CBC
HCT: 43 % (ref 35.0–47.0)
Hemoglobin: 13.9 g/dL (ref 12.0–16.0)
MCH: 29 pg (ref 26.0–34.0)
MCHC: 32.3 g/dL (ref 32.0–36.0)
MCV: 89.9 fL (ref 80.0–100.0)
PLATELETS: 99 10*3/uL — AB (ref 150–440)
RBC: 4.78 MIL/uL (ref 3.80–5.20)
RDW: 17.5 % — AB (ref 11.5–14.5)
WBC: 3.6 10*3/uL (ref 3.6–11.0)

## 2014-09-30 LAB — URINE DRUG SCREEN, QUALITATIVE (ARMC ONLY)
Amphetamines, Ur Screen: NOT DETECTED
BENZODIAZEPINE, UR SCRN: POSITIVE — AB
Barbiturates, Ur Screen: NOT DETECTED
Cannabinoid 50 Ng, Ur ~~LOC~~: NOT DETECTED
Cocaine Metabolite,Ur ~~LOC~~: NOT DETECTED
MDMA (Ecstasy)Ur Screen: NOT DETECTED
METHADONE SCREEN, URINE: NOT DETECTED
Opiate, Ur Screen: NOT DETECTED
PHENCYCLIDINE (PCP) UR S: NOT DETECTED
Tricyclic, Ur Screen: POSITIVE — AB

## 2014-09-30 LAB — COMPREHENSIVE METABOLIC PANEL
ALT: 15 U/L (ref 14–54)
ANION GAP: 9 (ref 5–15)
AST: 26 U/L (ref 15–41)
Albumin: 3.5 g/dL (ref 3.5–5.0)
Alkaline Phosphatase: 139 U/L — ABNORMAL HIGH (ref 38–126)
BILIRUBIN TOTAL: 0.9 mg/dL (ref 0.3–1.2)
BUN: 18 mg/dL (ref 6–20)
CHLORIDE: 112 mmol/L — AB (ref 101–111)
CO2: 23 mmol/L (ref 22–32)
Calcium: 9 mg/dL (ref 8.9–10.3)
Creatinine, Ser: 0.91 mg/dL (ref 0.44–1.00)
GFR calc Af Amer: >60 mL/min (ref >60)
GFR calc non Af Amer: >60 mL/min (ref >60)
Glucose, Bld: 74 mg/dL (ref 65–99)
Potassium: 4.6 mmol/L (ref 3.5–5.1)
Sodium: 144 mmol/L (ref 135–145)
TOTAL PROTEIN: 6.4 g/dL — AB (ref 6.5–8.1)

## 2014-09-30 LAB — BLOOD GAS, ARTERIAL
ACID-BASE DEFICIT: 0.5 mmol/L (ref 0.0–2.0)
Allens test (pass/fail): POSITIVE — AB
Bicarbonate: 23.1 mEq/L (ref 21.0–28.0)
FIO2: 0.21
O2 Saturation: 97.7 %
Patient temperature: 37
pCO2 arterial: 34 mmHg (ref 32.0–48.0)
pH, Arterial: 7.44 (ref 7.350–7.450)
pO2, Arterial: 95 mmHg (ref 83.0–108.0)

## 2014-09-30 LAB — SALICYLATE LEVEL: Salicylate Lvl: <4 mg/dL (ref 2.8–30.0)

## 2014-09-30 LAB — ETHANOL

## 2014-09-30 LAB — TROPONIN I

## 2014-09-30 MED ORDER — SODIUM CHLORIDE 0.9 % IJ SOLN
3.0000 mL | Freq: Two times a day (BID) | INTRAMUSCULAR | Status: DC
  Administered 2014-10-03: 3 mL via INTRAVENOUS

## 2014-09-30 MED ORDER — ONDANSETRON HCL 4 MG PO TABS: 4.0000 mg | ORAL_TABLET | Freq: Four times a day (QID) | ORAL | Status: DC | PRN

## 2014-09-30 MED ORDER — NALOXONE HCL 1 MG/ML IJ SOLN
INTRAMUSCULAR | Status: AC
  Administered 2014-09-30: 0.2 mg
  Filled 2014-09-30: qty 2

## 2014-09-30 MED ORDER — OXYCODONE HCL 5 MG PO TABS: 5.0000 mg | ORAL_TABLET | ORAL | Status: DC | PRN

## 2014-09-30 MED ORDER — ONDANSETRON HCL 4 MG/2ML IJ SOLN
4.0000 mg | Freq: Four times a day (QID) | INTRAMUSCULAR | Status: DC | PRN
  Administered 2014-10-02: 4 mg via INTRAVENOUS
  Filled 2014-09-30: qty 2

## 2014-09-30 MED ORDER — MORPHINE SULFATE 2 MG/ML IJ SOLN: 2.0000 mg | INTRAMUSCULAR | Status: DC | PRN

## 2014-09-30 MED ORDER — NALOXONE HCL 0.4 MG/ML IJ SOLN: 0.2000 mg | Freq: Once | INTRAMUSCULAR | Status: AC

## 2014-09-30 MED ORDER — ACETAMINOPHEN 325 MG PO TABS: 650.0000 mg | ORAL_TABLET | Freq: Four times a day (QID) | ORAL | Status: DC | PRN

## 2014-09-30 MED ORDER — SODIUM CHLORIDE 0.9 % IV SOLN
INTRAVENOUS | Status: DC
  Administered 2014-09-30 – 2014-10-01 (×2): via INTRAVENOUS

## 2014-09-30 MED ORDER — ACETAMINOPHEN 650 MG RE SUPP: 650.0000 mg | Freq: Four times a day (QID) | RECTAL | Status: DC | PRN

## 2014-09-30 MED ORDER — HEPARIN SODIUM (PORCINE) 5000 UNIT/ML IJ SOLN: 5000.0000 [IU] | Freq: Three times a day (TID) | INTRAMUSCULAR | Status: DC

## 2014-09-30 NOTE — ED Provider Notes (Signed)
[facility] Emergency Department Provider Note ___________________________________________  Time seen: Approximately 5:48 PM  I have reviewed the triage vital signs and the nursing notes.   HISTORY  Chief Complaint Drug Overdose  HPI Leslie Marks is a 44 y.o. female white female who was found semi-responsive by her boyfriend who called EMS. He thought she had taken too much fentanyl and oxycodone. On EMS arrival they gave the patient 1 mg of Narcan and felt like she had some recovery and was much more alert. But not long after that patient again became much more lethargic and so they brought her into the ER.On arrival to the ER patient was lethargic but arousable per deep sternal rub. Patient also arouse to procedures being done to her. Patient could not give [place] any other significant history secondary to her condition.   Past Medical History  Diagnosis Date  . Depression   . Chronic anticoagulation   . Pulmonary emboli 2007  . Chronic pain syndrome   . Chronic anxiety   . Non-diabetic hypoglycemia   . Chronic abdominal pain   . Lumbago   . Migraine   . Iron deficiency anemia   . B12 deficiency   . Fibromyalgia   . GERD (gastroesophageal reflux disease)   . Hypoglycemia   . Vitamin D deficiency   . Encephalopathy   . Opiate dependence   . Benzodiazepine dependence   . Overdose   . DVT (deep venous thrombosis)   . Asthma   . Collagen vascular disease   . CHF (congestive heart failure)     Patient Active Problem List   Diagnosis Date Noted  . Benzodiazepine overdose 09/30/2014  . Encephalopathy 09/30/2014  . 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 Malabsorption syndrome 05/27/2013  . Cholelithiasis 05/12/2013  . Lupus anticoagulant positive 04/29/2013  . 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 Altered mental status 04/27/2013  . Pulmonary emboli 04/27/2013  . Chronic pain syndrome 04/27/2013  .  Chronic anxiety 04/27/2013  . Non-diabetic hypoglycemia 04/27/2013  . Acute encephalopathy 04/26/2013  . Depression 04/26/2013  . Chronic anticoagulation 04/26/2013  . Pancytopenia 04/26/2013    Past Surgical History  Procedure Laterality Date  . Gastric bypass  2003  . Cesarean section    . Appendectomy    . Tonsillectomy    . Abdominal hysterectomy      Current Outpatient Rx  Name  Route  Sig  Dispense  Refill  . bisacodyl (DULCOLAX) 5 MG EC tablet   Oral   Take 10 mg by mouth daily. To be taken on an empty stomach         . carisoprodol (SOMA) 350 MG tablet   Oral   Take 1 tablet (350 mg total) by mouth 3 (three) times daily.   15 tablet   0   . Cholecalciferol (VITAMIN D) 2000 UNITS CAPS   Oral   Take 2,000 Units by mouth daily.         . clonazePAM (KLONOPIN) 2 MG tablet   Oral   Take 2 mg by mouth 3 (three) times daily.         . cloNIDine (CATAPRES) 0.1 MG tablet   Oral   Take 0.1 mg by mouth every 8 (eight) hours.         . cyclobenzaprine (FLEXERIL) 10 MG tablet   Oral   Take 10 mg by mouth 3 (three) times daily.         Marland Kitchen  docusate sodium (COLACE) 100 MG capsule   Oral   Take 100 mg by mouth 2 (two) times daily.         Marland Kitchen doxycycline (VIBRA-TABS) 100 MG tablet   Oral   Take 1 tablet (100 mg total) by mouth 2 (two) times daily.   20 tablet   0   . esomeprazole (NEXIUM) 40 MG capsule   Oral   Take 40 mg by mouth 2 (two) times daily.         . fluticasone (FLONASE) 50 MCG/ACT nasal spray   Each Nare   Place 1 spray into both nostrils daily.         . furosemide (LASIX) 20 MG tablet   Oral   Take 1 tablet (20 mg total) by mouth daily.   30 tablet   0   . gabapentin (NEURONTIN) 300 MG capsule   Oral   Take 600 mg by mouth daily.         Marland Kitchen gabapentin (NEURONTIN) 400 MG capsule   Oral   Take 1,200 mg by mouth 2 (two) times daily.         Marland Kitchen gabapentin (NEURONTIN) 600 MG tablet   Oral   Take 1,200 mg by mouth 3 (three)  times daily.         Marland Kitchen glucagon (GLUCAGEN HYPOKIT) 1 MG SOLR injection   Intravenous   Inject 1 mg into the vein once as needed for low blood sugar.         . hydrOXYzine (ATARAX/VISTARIL) 25 MG tablet   Oral   Take 1 tablet (25 mg total) by mouth 3 (three) times daily as needed for anxiety or itching.   12 tablet   0   . Liniments (ANALGESIC BALM EX)   Apply externally   Apply 1 application topically 4 (four) times daily as needed (for pain).         . magnesium oxide (MAG-OX) 400 MG tablet   Oral   Take 400 mg by mouth 3 (three) times daily.         . methadone (DOLOPHINE) 10 MG tablet   Oral   Take 60 mg by mouth 2 (two) times daily.         . methocarbamol (ROBAXIN) 750 MG tablet   Oral   Take 750 mg by mouth 3 (three) times daily.         . mometasone (NASONEX) 50 MCG/ACT nasal spray   Nasal   Place 2 sprays into the nose daily.         Marland Kitchen OLANZapine (ZYPREXA) 10 MG tablet   Oral   Take 10 mg by mouth at bedtime.         Marland Kitchen OLANZapine zydis (ZYPREXA) 10 MG disintegrating tablet   Oral   Take 10 mg by mouth at bedtime.         Marland Kitchen omeprazole (PRILOSEC) 20 MG capsule   Oral   Take 40 mg by mouth daily.         . ondansetron (ZOFRAN) 4 MG tablet   Oral   Take 8 mg by mouth 3 (three) times daily as needed for nausea or vomiting.         Marland Kitchen oxyCODONE (OXY IR/ROXICODONE) 5 MG immediate release tablet   Oral   Take 1 tablet (5 mg total) by mouth every 6 (six) hours as needed for moderate pain.   15 tablet   0   . OxyCODONE (OXYCONTIN) 20 mg  T12A 12 hr tablet   Oral   Take 1 tablet (20 mg total) by mouth every 12 (twelve) hours.   8 tablet   0   . OxyCODONE HCl ER (OXYCONTIN) 30 MG T12A   Oral   Take 30 mg by mouth every 12 (twelve) hours.         . pantoprazole (PROTONIX) 40 MG tablet   Oral   Take 40 mg by mouth daily.         . propranolol (INDERAL) 40 MG tablet   Oral   Take 40 mg by mouth 3 (three) times daily.         .  Rivaroxaban (XARELTO) 20 MG TABS tablet   Oral   Take 20 mg by mouth daily with supper.         Marland Kitchen ROXICET 5-325 MG per tablet   Oral   Take 1 tablet by mouth every 6 (six) hours as needed.   12 tablet   0     Dispense as written.   . thiamine (VITAMIN B-1) 100 MG tablet   Oral   Take 100 mg by mouth 2 (two) times daily.         . traZODone (DESYREL) 100 MG tablet   Oral   Take 200 mg by mouth at bedtime.         . vitamin B-12 (CYANOCOBALAMIN) 1000 MCG tablet   Oral   Take 1,000 mcg by mouth daily.         Marland Kitchen zolpidem (AMBIEN) 10 MG tablet   Oral   Take 10 mg by mouth at bedtime.           Allergies Amoxicillin; Augmentin; Betadine; Ciprofloxacin; Erythromycin; Latex; Penicillins; Adhesive; and Lyrica  Family History  Problem Relation Age of Onset  . Heart failure Neg Hx     Social History Social History  Substance Use Topics  . Smoking status: Never Smoker   . Smokeless tobacco: None  . Alcohol Use: No    Review of Systems Constitutional: No fever/chills Eyes: No visual changes. ENT: No sore throat. Cardiovascular: Denies chest pain. Respiratory: Denies shortness of breath. Gastrointestinal: No abdominal pain.  No nausea, no vomiting.  No diarrhea.  No constipation. Genitourinary: Negative for dysuria. Musculoskeletal: Negative for back pain. Skin: Negative for rash. Neurological: Negative for headaches, focal weakness or numbness. 10-point ROS otherwise negative. Patient is a poor historian and unable to give any other significant review of systems secondary to her condition. History as above plus per EMS. Upon chart review patient was seen here on August 2 for pneumonia. Patient has history of a previous stroke in the past. ____________________________________________   PHYSICAL EXAM:  VITAL SIGNS: ED Triage Vitals  Enc Vitals Group     BP --      Pulse --      Resp --      Temp --      Temp src --      SpO2 --      Weight --       Height --      Head Cir --      Peak Flow --      Pain Score --      Pain Loc --      Pain Edu? --      Excl. in [place]? --     Constitutional: Patient extremely lethargic but will arouse to deep painful stimuli.  Eyes: Conjunctivae are normal. PERRL. EOMI.  pupils are round 2 mm bilaterally but they are reactive. Head: Atraumatic. Nose: No congestion/rhinnorhea. Mouth/Throat: Mucous membranes are moist.  Oropharynx non-erythematous. Neck: No stridor.   Cardiovascular: Normal rate, regular rhythm. Grossly normal heart sounds.  Good peripheral circulation. Respiratory: Normal respiratory effort.  No retractions. Lungs CTAB. Gastrointestinal: Soft and nontender. No distention. No abdominal bruits. No CVA tenderness. Musculoskeletal: No lower extremity tenderness nor edema.  No joint effusions. Neurologic:  Patient lethargic and moaning. Patient with previous right upper extremity contracture secondary to previous stroke.. Unable to assess for gait stability the patient does move both lower extremities well and appears to move both upper extremities well Skin:  Skin is warm, dry and intact. No rash noted. Psychiatric: Unable to assess mood secondary to severe altered mental status..   ____________________________________________   LABS (all labs ordered are listed, but only abnormal results are displayed)  Labs Reviewed  CBC - Abnormal; Notable for the following:    RDW 17.5 (*)    Platelets 99 (*)    All other components within normal limits  COMPREHENSIVE METABOLIC PANEL - Abnormal; Notable for the following:    Chloride 112 (*)    Total Protein 6.4 (*)    Alkaline Phosphatase 139 (*)    All other components within normal limits  URINE DRUG SCREEN, QUALITATIVE ([facility] ONLY) - Abnormal; Notable for the following:    Tricyclic, Ur Screen POSITIVE (*)    Benzodiazepine, Ur Scrn POSITIVE (*)    All other components within normal limits  ACETAMINOPHEN LEVEL - Abnormal; Notable for the  following:    Acetaminophen (Tylenol), Serum <10 (*)    All other components within normal limits  BLOOD GAS, ARTERIAL - Abnormal; Notable for the following:    Allens test (pass/fail) POSITIVE (*)    All other components within normal limits  ETHANOL  TROPONIN I  SALICYLATE LEVEL   ____________________________________________  EKG  ED ECG REPORT I, Ruby Cola, the attending physician, personally viewed and interpreted this ECG.   Date: 09/30/2014  EKG Time: 1750  Rate: 72  Rhythm: normal EKG, normal sinus rhythm, no acute changes  Axis normal  Intervals:none  ST&T Change: Nonspecific ST changes  ____________________________________________  RADIOLOGY Dg Chest 1 View  09/30/2014   CLINICAL DATA:  Recent drug overdose  EXAM: CHEST  1 VIEW  COMPARISON:  09/20/2014  FINDINGS: Cardiac shadow is within normal limits. Elevation of left hemidiaphragm is again seen. Very minimal left basilar atelectasis is noted. No focal confluent infiltrate is noted. No bony abnormality is seen.  IMPRESSION: Minimal left basilar atelectasis.   Electronically Signed   By: Inez Catalina M.D.   On: 09/30/2014 19:08   Dg Chest 2 View  09/20/2014   CLINICAL DATA:  Chest pain extending into the right arm. Nausea. Unable to eat.  EXAM: CHEST - 2 VIEW  COMPARISON:  CT of the abdomen and pelvis 05/19/2013. Chest x-ray 04/26/2013.  FINDINGS: The heart size is normal. There is volume loss at the left base with elevation of left hemidiaphragm. The lungs are otherwise clear. The visualized soft tissues and bony thorax are unremarkable.  IMPRESSION: 1. Progressive elevation of the left hemidiaphragm. No clear etiology is evident. 2. Mild volume loss at the left base is associated.   Electronically Signed   By: San Morelle M.D.   On: 09/20/2014 16:23   Ct Head Wo Contrast  09/30/2014   CLINICAL DATA:  Drug overdose.  Found unresponsive.  EXAM: CT HEAD WITHOUT  CONTRAST  TECHNIQUE: Contiguous axial images  were obtained from the base of the skull through the vertex without intravenous contrast.  COMPARISON:  CT head without contrast 04/26/2013.  FINDINGS: No acute cortical infarct, hemorrhage, or mass lesion is present. The ventricles are of normal size. No significant extra-axial fluid collection is evident. The paranasal sinuses and mastoid air cells are clear. The calvarium is intact. The extracranial soft tissues are within normal limits. The globes and orbits are intact.  IMPRESSION: Negative CT of the head.   Electronically Signed   By: San Morelle M.D.   On: 09/30/2014 19:19   Ct Angio Chest Pe W/cm &/or Wo Cm  09/20/2014   CLINICAL DATA:  Chest pain, extending to the right upper extremity.  EXAM: CT ANGIOGRAPHY CHEST WITH CONTRAST  TECHNIQUE: Multidetector CT imaging of the chest was performed using the standard protocol during bolus administration of intravenous contrast. Multiplanar CT image reconstructions and MIPs were obtained to evaluate the vascular anatomy.  CONTRAST:  112mL OMNIPAQUE IOHEXOL 350 MG/ML SOLN  COMPARISON:  Chest radiograph September 20, 2014  FINDINGS: There is no demonstrable pulmonary embolus. There is no thoracic aortic aneurysm or dissection. Visualize great vessels are unremarkable.  There is elevation of the left hemidiaphragm. There is mild consolidation in the left lung base. There is patchy alveolar opacity in the lungs fairly diffusely, most likely representing edema. There is also interstitial edema in the bases.  Visualized thyroid appears normal. No appreciable thoracic adenopathy. There is a small pericardial effusion.  There is a fairly small hiatal hernia. In the visualized upper abdomen, there is a degree of hepatic steatosis.  There are no blastic or lytic bone lesions.  Review of the MIP images confirms the above findings.  IMPRESSION: No demonstrable pulmonary embolus. Evidence of a degree of congestive heart failure. Elevation left hemidiaphragm with mild  consolidation left base. No adenopathy. Small pericardial effusion. Hepatic steatosis. Small hiatal hernia.   Electronically Signed   By: Lowella Grip III M.D.   On: 09/20/2014 19:27    ____________________________________________   PROCEDURES  Procedure(s) performed: None  Critical Care performed: No  ____________________________________________   INITIAL IMPRESSION / ASSESSMENT AND PLAN / ED COURSE  Pertinent labs & imaging results that were available during my care of the patient were reviewed by me and considered in my medical decision making (see chart for details).  ----------------------------------------- 6:02 PM on 09/30/2014 ----------------------------------------- Patient will be given an additional 2 mg of Narcan IV at this time. Patient will get labs, CT head and chest x-ray.  9pm Pt was admitted per Dr. Nadara Mustard the hospitalist.  Pt was becoming more and more responsive as time went by, but was still too lethargic to go to psych.  Family had taken an involuntary commitment papers on room patient prior to coming to the ER but was told that the patient had to be admitted medically first and then they would have psychiatry see her. Patient basically just continued to get IV fluids in the ER.   Ruby Cola, MD 10/01/14 [phone]

## 2014-09-30 NOTE — ED Notes (Signed)
Applied wound dressing (4X4 gauze) Location: abdominal/umbilical

## 2014-09-30 NOTE — ED Notes (Signed)
ABG attempted by resp and was unsuccessful

## 2014-09-30 NOTE — H&P (Signed)
[facility] [place] NAME: Leslie Marks    MR#:  [id number]  DATE OF BIRTH:  Feb 13, 1971   DATE OF ADMISSION:  09/30/2014  PRIMARY CARE PHYSICIAN: No PCP Per Patient   REQUESTING/REFERRING PHYSICIAN: Marta Antu  CHIEF COMPLAINT:   Chief Complaint  Patient presents with  . Drug Overdose   altered mental status  HISTORY OF PRESENT ILLNESS:  Leslie Marks  is a 44 y.o. female with a known history of chronic pain, history of DVT presenting with altered mental status. The patient is unable to provide any meaningful information given mental status/medical condition. Her family members who are no longer present sent this is happening numerous times in the past secondary to drug overdoses. She does have multiple different medications for pain, as well as benzodiazepine's. Her drug screen is negative for purulence and positive for benzodiazepine. She was somnolent, thus prompting presentation to the hospital where she remained somnolent. She is arousable but provides no useful information.  PAST MEDICAL HISTORY:   Past Medical History  Diagnosis Date  . Depression   . Chronic anticoagulation   . Pulmonary emboli 2007  . Chronic pain syndrome   . Chronic anxiety   . Non-diabetic hypoglycemia   . Chronic abdominal pain   . Lumbago   . Migraine   . Iron deficiency anemia   . B12 deficiency   . Fibromyalgia   . GERD (gastroesophageal reflux disease)   . Hypoglycemia   . Vitamin D deficiency   . Encephalopathy   . Opiate dependence   . Benzodiazepine dependence   . Overdose   . DVT (deep venous thrombosis)   . Asthma   . Collagen vascular disease   . CHF (congestive heart failure)     PAST SURGICAL HISTORY:   Past Surgical History  Procedure Laterality Date  . Gastric bypass  2003  . Cesarean section    . Appendectomy    . Tonsillectomy    . Abdominal hysterectomy      SOCIAL HISTORY:   Social History   Substance Use Topics  . Smoking status: Never Smoker   . Smokeless tobacco: Not on file  . Alcohol Use: No    FAMILY HISTORY:   Family History  Problem Relation Age of Onset  . Heart failure Neg Hx     DRUG ALLERGIES:   Allergies  Allergen Reactions  . Amoxicillin Anaphylaxis  . Augmentin [Amoxicillin-Pot Clavulanate] Anaphylaxis  . Betadine [Povidone Iodine] Anaphylaxis  . Ciprofloxacin Anaphylaxis  . Erythromycin Anaphylaxis  . Latex Anaphylaxis  . Penicillins Anaphylaxis  . Adhesive [Tape]   . Lyrica [Pregabalin]     REVIEW OF SYSTEMS:  Unable to obtain given patient's mental status/medical condition   MEDICATIONS AT HOME:   Prior to Admission medications   Medication Sig Start Date End Date Taking? Authorizing Provider  bisacodyl (DULCOLAX) 5 MG EC tablet Take 10 mg by mouth daily. To be taken on an empty stomach    Historical Provider, MD  carisoprodol (SOMA) 350 MG tablet Take 1 tablet (350 mg total) by mouth 3 (three) times daily. 05/19/13   Riki Altes, MD  Cholecalciferol (VITAMIN D) 2000 UNITS CAPS Take 2,000 Units by mouth daily.    Historical Provider, MD  clonazePAM (KLONOPIN) 2 MG tablet Take 2 mg by mouth 3 (three) times daily.    Historical Provider, MD  cloNIDine (CATAPRES) 0.1 MG tablet Take 0.1 mg by mouth every 8 (eight) hours.  Historical Provider, MD  cyclobenzaprine (FLEXERIL) 10 MG tablet Take 10 mg by mouth 3 (three) times daily.    Historical Provider, MD  docusate sodium (COLACE) 100 MG capsule Take 100 mg by mouth 2 (two) times daily.    Historical Provider, MD  doxycycline (VIBRA-TABS) 100 MG tablet Take 1 tablet (100 mg total) by mouth 2 (two) times daily. 09/20/14   Harvest Dark, MD  esomeprazole (NEXIUM) 40 MG capsule Take 40 mg by mouth 2 (two) times daily.    Historical Provider, MD  fluticasone (FLONASE) 50 MCG/ACT nasal spray Place 1 spray into both nostrils daily.    Historical Provider, MD  furosemide (LASIX) 20 MG tablet Take 1  tablet (20 mg total) by mouth daily. 10/24/76   Delora Fuel, MD  gabapentin (NEURONTIN) 300 MG capsule Take 600 mg by mouth daily.    Historical Provider, MD  gabapentin (NEURONTIN) 400 MG capsule Take 1,200 mg by mouth 2 (two) times daily.    Historical Provider, MD  gabapentin (NEURONTIN) 600 MG tablet Take 1,200 mg by mouth 3 (three) times daily.    Historical Provider, MD  glucagon (GLUCAGEN HYPOKIT) 1 MG SOLR injection Inject 1 mg into the vein once as needed for low blood sugar.    Historical Provider, MD  hydrOXYzine (ATARAX/VISTARIL) 25 MG tablet Take 1 tablet (25 mg total) by mouth 3 (three) times daily as needed for anxiety or itching. 04/29/13   Nishant Dhungel, MD  Liniments (ANALGESIC BALM EX) Apply 1 application topically 4 (four) times daily as needed (for pain).    Historical Provider, MD  magnesium oxide (MAG-OX) 400 MG tablet Take 400 mg by mouth 3 (three) times daily.    Historical Provider, MD  methadone (DOLOPHINE) 10 MG tablet Take 60 mg by mouth 2 (two) times daily.    Historical Provider, MD  methocarbamol (ROBAXIN) 750 MG tablet Take 750 mg by mouth 3 (three) times daily.    Historical Provider, MD  mometasone (NASONEX) 50 MCG/ACT nasal spray Place 2 sprays into the nose daily.    Historical Provider, MD  OLANZapine (ZYPREXA) 10 MG tablet Take 10 mg by mouth at bedtime.    Historical Provider, MD  OLANZapine zydis (ZYPREXA) 10 MG disintegrating tablet Take 10 mg by mouth at bedtime.    Historical Provider, MD  omeprazole (PRILOSEC) 20 MG capsule Take 40 mg by mouth daily.    Historical Provider, MD  ondansetron (ZOFRAN) 4 MG tablet Take 8 mg by mouth 3 (three) times daily as needed for nausea or vomiting.    Historical Provider, MD  oxyCODONE (OXY IR/ROXICODONE) 5 MG immediate release tablet Take 1 tablet (5 mg total) by mouth every 6 (six) hours as needed for moderate pain. 04/29/13   Nishant Dhungel, MD  OxyCODONE (OXYCONTIN) 20 mg T12A 12 hr tablet Take 1 tablet (20 mg total)  by mouth every 12 (twelve) hours. 04/29/13   Nishant Dhungel, MD  OxyCODONE HCl ER (OXYCONTIN) 30 MG T12A Take 30 mg by mouth every 12 (twelve) hours.    Historical Provider, MD  pantoprazole (PROTONIX) 40 MG tablet Take 40 mg by mouth daily.    Historical Provider, MD  propranolol (INDERAL) 40 MG tablet Take 40 mg by mouth 3 (three) times daily.    Historical Provider, MD  Rivaroxaban (XARELTO) 20 MG TABS tablet Take 20 mg by mouth daily with supper.    Historical Provider, MD  ROXICET 5-325 MG per tablet Take 1 tablet by mouth every 6 (six) hours  as needed. 09/20/14   Harvest Dark, MD  thiamine (VITAMIN B-1) 100 MG tablet Take 100 mg by mouth 2 (two) times daily.    Historical Provider, MD  traZODone (DESYREL) 100 MG tablet Take 200 mg by mouth at bedtime.    Historical Provider, MD  vitamin B-12 (CYANOCOBALAMIN) 1000 MCG tablet Take 1,000 mcg by mouth daily.    Historical Provider, MD  zolpidem (AMBIEN) 10 MG tablet Take 10 mg by mouth at bedtime.    Historical Provider, MD      VITAL SIGNS:  Blood pressure 131/91, pulse 72, resp. rate 16, height 5\' 8"  (1.727 m), weight 169 lb 11.2 oz (76.975 kg), SpO2 96 %.  PHYSICAL EXAMINATION:   VITAL SIGNS: Filed Vitals:   09/30/14 1801  BP: 131/91  Pulse: 72  Resp: 66   GENERAL:43 y.o.female moderate distress given mental status.  HEAD: Normocephalic, atraumatic.  EYES: Pupils equal, round, reactive to light. Unable to assess extraocular muscles given mental status/medical condition. No scleral icterus.  MOUTH: Moist mucosal membrane. Dentition intact. No abscess noted.  EAR, NOSE, THROAT: Clear without exudates. No external lesions.  NECK: Supple. No thyromegaly. No nodules. No JVD.  PULMONARY: Clear to ascultation, without wheeze rails or rhonci. No use of accessory muscles, Good respiratory effort. good air entry bilaterally CHEST: Nontender to palpation.  CARDIOVASCULAR: S1 and S2. Regular rate and rhythm. No murmurs, rubs, or gallops.  No edema. Pedal pulses 2+ bilaterally.  GASTROINTESTINAL: Soft, nontender, nondistended. No masses. Positive bowel sounds. No hepatosplenomegaly.  MUSCULOSKELETAL: No swelling, clubbing, or edema. Range of motion full in all extremities.  NEUROLOGIC: Unable to assess given mental status/medical condition SKIN: No ulceration, lesions, rashes, or cyanosis. Skin warm and dry. Turgor intact.  PSYCHIATRIC: Unable to assess given mental status/medical condition      LABORATORY PANEL:   CBC  Recent Labs Lab 09/30/14 1804  WBC 3.6  HGB 13.9  HCT 43.0  PLT 99*   ------------------------------------------------------------------------------------------------------------------  Chemistries   Recent Labs Lab 09/30/14 1804  NA 144  K 4.6  CL 112*  CO2 23  GLUCOSE 74  BUN 18  CREATININE 0.91  CALCIUM 9.0  AST 26  ALT 15  ALKPHOS 139*  BILITOT 0.9   ------------------------------------------------------------------------------------------------------------------  Cardiac Enzymes  Recent Labs Lab 09/30/14 1804  TROPONINI <0.03   ------------------------------------------------------------------------------------------------------------------  RADIOLOGY:  Dg Chest 1 View  09/30/2014   CLINICAL DATA:  Recent drug overdose  EXAM: CHEST  1 VIEW  COMPARISON:  09/20/2014  FINDINGS: Cardiac shadow is within normal limits. Elevation of left hemidiaphragm is again seen. Very minimal left basilar atelectasis is noted. No focal confluent infiltrate is noted. No bony abnormality is seen.  IMPRESSION: Minimal left basilar atelectasis.   Electronically Signed   By: Inez Catalina M.D.   On: 09/30/2014 19:08   Ct Head Wo Contrast  09/30/2014   CLINICAL DATA:  Drug overdose.  Found unresponsive.  EXAM: CT HEAD WITHOUT CONTRAST  TECHNIQUE: Contiguous axial images were obtained from the base of the skull through the vertex without intravenous contrast.  COMPARISON:  CT head without contrast  04/26/2013.  FINDINGS: No acute cortical infarct, hemorrhage, or mass lesion is present. The ventricles are of normal size. No significant extra-axial fluid collection is evident. The paranasal sinuses and mastoid air cells are clear. The calvarium is intact. The extracranial soft tissues are within normal limits. The globes and orbits are intact.  IMPRESSION: Negative CT of the head.   Electronically Signed   By:  San Morelle M.D.   On: 09/30/2014 19:19    EKG:   Orders placed or performed during the hospital encounter of 09/20/14  . ED EKG within 10 minutes  . ED EKG within 10 minutes  . EKG 12-Lead  . EKG 12-Lead  . EKG    IMPRESSION AND PLAN:   44 year old Caucasian female history of chronic pain presenting with altered mental status.  1. Encephalopathy, acute: Likely secondary to benzodiazepine overdose mental status is improving. She still remains somnolent. She is placed on IVC per her family, consult psychiatry, neuro checks, avoid further sedating agents 2. GERD without esophagitis: PPI therapy 3. Essential hypertension: Catapres 4. Chronic pain: Given her large multitude of various medications will hold on current medications as her drug screen is negative for need to confirm with pharmacy prior to restarting any of these medications 5. Venous thromboembolism prophylactic: Therapeutic Xarelto    All the records are reviewed and case discussed with ED provider. Management plans discussed with the patient, family and they are in agreement.  CODE STATUS: Full  TOTAL TIME TAKING CARE OF THIS PATIENT: 35 minutes.    Hower,  Karenann Cai.D on 09/30/2014 at 9:12 PM  Between 7am to 6pm - Pager - [phone]  After 6pm: House Pager: - [phone]  [facility]  Office  [phone]  CC: Primary care physician; No PCP Per Patient

## 2014-09-30 NOTE — ED Notes (Signed)
resp called for ABG

## 2014-09-30 NOTE — ED Notes (Signed)
Pt found by boyfriend this afternoon, pt possibly took oxycodone and fentanyl because pt has a hx of this in the past boyfriend states that he had thrown all this away, he reported that she does go to the methadone clinic and comes home altered the way she is now.  He reported to police that he doesn't know if she has any of those meds or where she keeps them if she does, pt ivc'd by [place] sherriff's dept for overdose. Pt has slurred speech on arrival with pin point pupils and sleeping between being examined.

## 2014-10-01 ENCOUNTER — Encounter: Payer: Self-pay | Admitting: Emergency Medicine

## 2014-10-01 MED ORDER — OLANZAPINE 10 MG PO TBDP
10.0000 mg | ORAL_TABLET | Freq: Every day | ORAL | Status: DC
  Administered 2014-10-01 – 2014-10-02 (×2): 10 mg via ORAL
  Filled 2014-10-01 (×3): qty 1

## 2014-10-01 MED ORDER — CYCLOBENZAPRINE HCL 10 MG PO TABS
10.0000 mg | ORAL_TABLET | Freq: Three times a day (TID) | ORAL | Status: DC
  Administered 2014-10-01 – 2014-10-02 (×4): 10 mg via ORAL
  Filled 2014-10-01 (×4): qty 1

## 2014-10-01 MED ORDER — VITAMIN D 1000 UNITS PO TABS
2000.0000 [IU] | ORAL_TABLET | Freq: Every day | ORAL | Status: DC
  Administered 2014-10-01 – 2014-10-03 (×3): 2000 [IU] via ORAL
  Filled 2014-10-01 (×3): qty 2

## 2014-10-01 MED ORDER — HYDROXYZINE HCL 25 MG PO TABS: 25.0000 mg | ORAL_TABLET | Freq: Three times a day (TID) | ORAL | Status: DC | PRN

## 2014-10-01 MED ORDER — VITAMIN B-1 100 MG PO TABS
100.0000 mg | ORAL_TABLET | Freq: Two times a day (BID) | ORAL | Status: DC
Start: 2014-10-01 — End: 2014-10-03
  Administered 2014-10-01 – 2014-10-03 (×6): 100 mg via ORAL
  Filled 2014-10-01 (×5): qty 1

## 2014-10-01 MED ORDER — GABAPENTIN 600 MG PO TABS
1200.0000 mg | ORAL_TABLET | Freq: Three times a day (TID) | ORAL | Status: DC
  Administered 2014-10-01 – 2014-10-03 (×7): 1200 mg via ORAL
  Filled 2014-10-01 (×7): qty 2

## 2014-10-01 MED ORDER — PANTOPRAZOLE SODIUM 40 MG PO TBEC
40.0000 mg | DELAYED_RELEASE_TABLET | Freq: Every day | ORAL | Status: DC
  Administered 2014-10-01 – 2014-10-03 (×3): 40 mg via ORAL
  Filled 2014-10-01 (×3): qty 1

## 2014-10-01 MED ORDER — PROPRANOLOL HCL 40 MG PO TABS
40.0000 mg | ORAL_TABLET | Freq: Three times a day (TID) | ORAL | Status: DC
  Administered 2014-10-01 – 2014-10-03 (×4): 40 mg via ORAL
  Filled 2014-10-01 (×3): qty 1

## 2014-10-01 MED ORDER — VITAMIN B-12 1000 MCG PO TABS
1000.0000 ug | ORAL_TABLET | Freq: Every day | ORAL | Status: DC
  Administered 2014-10-01 – 2014-10-03 (×3): 1000 ug via ORAL
  Filled 2014-10-01 (×4): qty 1

## 2014-10-01 MED ORDER — BISACODYL 5 MG PO TBEC
10.0000 mg | DELAYED_RELEASE_TABLET | Freq: Every day | ORAL | Status: DC
  Administered 2014-10-01 – 2014-10-02 (×2): 10 mg via ORAL
  Filled 2014-10-01 (×2): qty 2

## 2014-10-01 MED ORDER — CLONIDINE HCL 0.1 MG PO TABS
0.1000 mg | ORAL_TABLET | Freq: Three times a day (TID) | ORAL | Status: DC
  Administered 2014-10-01 – 2014-10-03 (×6): 0.1 mg via ORAL
  Filled 2014-10-01 (×7): qty 1

## 2014-10-01 MED ORDER — RIVAROXABAN 20 MG PO TABS
20.0000 mg | ORAL_TABLET | Freq: Every day | ORAL | Status: DC
  Administered 2014-10-02: 20 mg via ORAL
  Filled 2014-10-01 (×2): qty 1

## 2014-10-01 MED ORDER — SODIUM CHLORIDE 0.9 % IV BOLUS (SEPSIS): 1000.0000 mL | Freq: Once | INTRAVENOUS | Status: DC

## 2014-10-01 MED ORDER — MORPHINE SULFATE 2 MG/ML IJ SOLN
1.0000 mg | INTRAMUSCULAR | Status: DC | PRN
  Administered 2014-10-01 (×3): 1 mg via INTRAVENOUS
  Filled 2014-10-01 (×4): qty 1

## 2014-10-01 MED ORDER — FLUTICASONE PROPIONATE 50 MCG/ACT NA SUSP
1.0000 | Freq: Every day | NASAL | Status: DC
  Administered 2014-10-01 – 2014-10-03 (×3): 1 via NASAL
  Filled 2014-10-01: qty 16

## 2014-10-01 MED ORDER — MAGNESIUM OXIDE 400 (241.3 MG) MG PO TABS
400.0000 mg | ORAL_TABLET | Freq: Three times a day (TID) | ORAL | Status: DC
  Administered 2014-10-01 – 2014-10-03 (×7): 400 mg via ORAL
  Filled 2014-10-01 (×7): qty 1

## 2014-10-01 NOTE — Progress Notes (Signed)
A&O but lethargic. Arousable, answers questions and follows commands.  Admitted from ED with multiple belongings. Sitter at side as patient is IVC. IV fluids infusing well. On RA. Wound noted on lower mid abdomen. Patient states this is from surgery back in January of 2016. States she is not to put a dressing on it.

## 2014-10-01 NOTE — Consult Note (Signed)
  Pt seen in [facility] [address] S Pt is a 44 yr old white female, not employed and is on disability for "Fibromyalgia and back problems,." Pt is single and is never married and according t o staff she has been told to leave and never come back to the place she lived. Pt denies and stated that she lives in an apt by herself and she called ambulance and when asked the reason she said" I don't know." PAST Psych history  Pt is too drowsy to give any information. Staff reports that she has over dosed on prescription pills on many occasions and this includes Klonopin. Methadone and Oxycodone. M.s. Pt is seen laying in bed and is very drowsy but can be aroused. 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 Plan Will re-eval pt again on 10/02/2014 when she is more alert . Staff reports that SS is involved in trying to help her with placemen and further planning. Continue One on One observation at this time

## 2014-10-01 NOTE — Progress Notes (Signed)
[place] [facility]                                                                                                                                                                                            Patient Demographics   Leslie Marks, is a 44 y.o. female, DOB - [birth year], YTK:[id number]  Admit date - 09/30/2014   Admitting Physician Lytle Butte, MD  Outpatient Primary MD for the patient is No PCP Per Patient   LOS -   Subjective: Patient admitted with some likely drug overdose. Currently on drowsy denies taking extra medications     Review of Systems:   CONSTITUTIONAL: No documented fever. No fatigue, weakness. No weight gain, no weight loss.  EYES: No blurry or double vision.  ENT: No tinnitus. No postnasal drip. No redness of the oropharynx.  RESPIRATORY: No cough, no wheeze, no hemoptysis. No dyspnea.  CARDIOVASCULAR: No chest pain. No orthopnea. No palpitations. No syncope.  GASTROINTESTINAL: No nausea, no vomiting or diarrhea. No abdominal pain. No melena or hematochezia.  GENITOURINARY: No dysuria or hematuria.  ENDOCRINE: No polyuria or nocturia. No heat or cold intolerance.  HEMATOLOGY: No anemia. No bruising. No bleeding.  INTEGUMENTARY: No rashes. No lesions.  MUSCULOSKELETAL: No arthritis. No swelling. No gout.  NEUROLOGIC: No numbness, tingling, or ataxia. No seizure-type activity.  PSYCHIATRIC: No anxiety. No insomnia. No ADD.    Vitals:   Filed Vitals:   10/01/14 0233 10/01/14 0523 10/01/14 0735 10/01/14 1152  BP: 134/81 114/64 108/64 122/77  Pulse: 57 73 54 76  Temp: 97.5 F (36.4 C) 97.5 F (36.4 C)  98.1 F (36.7 C)  TempSrc: Oral Oral  Oral  Resp: 16  18 22   Height:      Weight:      SpO2: 99% 100% 100% 100%    Wt Readings from Last 3 Encounters:  09/30/14 76.975 kg (169 lb 11.2 oz)  09/20/14 74.844 kg (165 lb)  05/26/13 86.183 kg (190 lb)     Intake/Output Summary (Last 24 hours) at  10/01/14 1326 Last data filed at 10/01/14 1251  Gross per 24 hour  Intake 693.33 ml  Output    450 ml  Net 243.33 ml    Physical Exam:   GENERAL: Pleasant-appearing in no apparent distress.  HEAD, EYES, EARS, NOSE AND THROAT: Atraumatic, normocephalic. Extraocular muscles are intact. Pupils equal and reactive to light. Sclerae anicteric. No conjunctival injection. No oro-pharyngeal erythema.  NECK: Supple. There is no jugular venous distention. No bruits, no lymphadenopathy, no thyromegaly.  HEART: Regular rate and rhythm,. No murmurs, no rubs, no  clicks.  LUNGS: Clear to auscultation bilaterally. No rales or rhonchi. No wheezes.  ABDOMEN: Soft, flat, nontender, nondistended. Has good bowel sounds. No hepatosplenomegaly appreciated.  EXTREMITIES: No evidence of any cyanosis, clubbing, or peripheral edema.  +2 pedal and radial pulses bilaterally.  NEUROLOGIC: Drowsy  SKIN: Moist and warm with no rashes appreciated.  Psych: Drowsy LN: No inguinal LN enlargement    Antibiotics   Anti-infectives    None      Medications   Scheduled Meds: . bisacodyl  10 mg Oral Daily  . cholecalciferol  2,000 Units Oral Daily  . cloNIDine  0.1 mg Oral 3 times per day  . cyclobenzaprine  10 mg Oral TID  . fluticasone  1 spray Each Nare Daily  . gabapentin  1,200 mg Oral TID  . magnesium oxide  400 mg Oral TID  . OLANZapine zydis  10 mg Oral QHS  . pantoprazole  40 mg Oral Daily  . propranolol  40 mg Oral TID  . rivaroxaban  20 mg Oral Q supper  . sodium chloride  3 mL Intravenous Q12H  . thiamine  100 mg Oral BID  . vitamin B-12  1,000 mcg Oral Daily   Continuous Infusions: . sodium chloride 100 mL/hr at 10/01/14 0333   PRN Meds:.acetaminophen **OR** acetaminophen, hydrOXYzine, morphine injection, ondansetron **OR** ondansetron (ZOFRAN) IV   Data Review:   Micro Results No results found for this or any previous visit (from the past 240 hour(s)).  Radiology Reports Dg Chest 1  View  09/30/2014   CLINICAL DATA:  Recent drug overdose  EXAM: CHEST  1 VIEW  COMPARISON:  09/20/2014  FINDINGS: Cardiac shadow is within normal limits. Elevation of left hemidiaphragm is again seen. Very minimal left basilar atelectasis is noted. No focal confluent infiltrate is noted. No bony abnormality is seen.  IMPRESSION: Minimal left basilar atelectasis.   Electronically Signed   By: Inez Catalina M.D.   On: 09/30/2014 19:08   Dg Chest 2 View  09/20/2014   CLINICAL DATA:  Chest pain extending into the right arm. Nausea. Unable to eat.  EXAM: CHEST - 2 VIEW  COMPARISON:  CT of the abdomen and pelvis 05/19/2013. Chest x-ray 04/26/2013.  FINDINGS: The heart size is normal. There is volume loss at the left base with elevation of left hemidiaphragm. The lungs are otherwise clear. The visualized soft tissues and bony thorax are unremarkable.  IMPRESSION: 1. Progressive elevation of the left hemidiaphragm. No clear etiology is evident. 2. Mild volume loss at the left base is associated.   Electronically Signed   By: San Morelle M.D.   On: 09/20/2014 16:23   Ct Head Wo Contrast  09/30/2014   CLINICAL DATA:  Drug overdose.  Found unresponsive.  EXAM: CT HEAD WITHOUT CONTRAST  TECHNIQUE: Contiguous axial images were obtained from the base of the skull through the vertex without intravenous contrast.  COMPARISON:  CT head without contrast 04/26/2013.  FINDINGS: No acute cortical infarct, hemorrhage, or mass lesion is present. The ventricles are of normal size. No significant extra-axial fluid collection is evident. The paranasal sinuses and mastoid air cells are clear. The calvarium is intact. The extracranial soft tissues are within normal limits. The globes and orbits are intact.  IMPRESSION: Negative CT of the head.   Electronically Signed   By: San Morelle M.D.   On: 09/30/2014 19:19   Ct Angio Chest Pe W/cm &/or Wo Cm  09/20/2014   CLINICAL DATA:  Chest pain, extending to  the right upper  extremity.  EXAM: CT ANGIOGRAPHY CHEST WITH CONTRAST  TECHNIQUE: Multidetector CT imaging of the chest was performed using the standard protocol during bolus administration of intravenous contrast. Multiplanar CT image reconstructions and MIPs were obtained to evaluate the vascular anatomy.  CONTRAST:  155mL OMNIPAQUE IOHEXOL 350 MG/ML SOLN  COMPARISON:  Chest radiograph September 20, 2014  FINDINGS: There is no demonstrable pulmonary embolus. There is no thoracic aortic aneurysm or dissection. Visualize great vessels are unremarkable.  There is elevation of the left hemidiaphragm. There is mild consolidation in the left lung base. There is patchy alveolar opacity in the lungs fairly diffusely, most likely representing edema. There is also interstitial edema in the bases.  Visualized thyroid appears normal. No appreciable thoracic adenopathy. There is a small pericardial effusion.  There is a fairly small hiatal hernia. In the visualized upper abdomen, there is a degree of hepatic steatosis.  There are no blastic or lytic bone lesions.  Review of the MIP images confirms the above findings.  IMPRESSION: No demonstrable pulmonary embolus. Evidence of a degree of congestive heart failure. Elevation left hemidiaphragm with mild consolidation left base. No adenopathy. Small pericardial effusion. Hepatic steatosis. Small hiatal hernia.   Electronically Signed   By: Lowella Grip III M.D.   On: 09/20/2014 19:27     CBC  Recent Labs Lab 09/30/14 1804  WBC 3.6  HGB 13.9  HCT 43.0  PLT 99*  MCV 89.9  MCH 29.0  MCHC 32.3  RDW 17.5*    Chemistries   Recent Labs Lab 09/30/14 1804  NA 144  K 4.6  CL 112*  CO2 23  GLUCOSE 74  BUN 18  CREATININE 0.91  CALCIUM 9.0  AST 26  ALT 15  ALKPHOS 139*  BILITOT 0.9   ------------------------------------------------------------------------------------------------------------------ estimated creatinine clearance is 88.7 mL/min (by C-G formula based on Cr of  0.91). ------------------------------------------------------------------------------------------------------------------ No results for input(s): HGBA1C in the last 72 hours. ------------------------------------------------------------------------------------------------------------------ No results for input(s): CHOL, HDL, LDLCALC, TRIG, CHOLHDL, LDLDIRECT in the last 72 hours. ------------------------------------------------------------------------------------------------------------------ No results for input(s): TSH, T4TOTAL, T3FREE, THYROIDAB in the last 72 hours.  Invalid input(s): FREET3 ------------------------------------------------------------------------------------------------------------------ No results for input(s): VITAMINB12, FOLATE, FERRITIN, TIBC, IRON, RETICCTPCT in the last 72 hours.  Coagulation profile No results for input(s): INR, PROTIME in the last 168 hours.  No results for input(s): DDIMER in the last 72 hours.  Cardiac Enzymes  Recent Labs Lab 09/30/14 1804  TROPONINI <0.03   ------------------------------------------------------------------------------------------------------------------ Invalid input(s): POCBNP    Assessment & Plan   44 year old Caucasian female history of chronic pain presenting with altered mental status.  1. Encephalopathy, acute: Due to secondary to benzodiazepine overdose . She still remains somnolent. She is placed on IVC per her family, consult psychiatry, neuro checks, avoid further sedating agents discontinue oxycodone 2. GERD without esophagitis: PPI therapy 3. Essential hypertension: Catapres stable 4. Chronic pain: Given her large multitude of various medications will hold on current medications as her drug screen is negative for need to confirm with pharmacy prior to restarting any of these medications 5. Venous thromboembolism prophylactic: Therapeutic Xarelto       Code Status Orders        Start      Ordered   09/30/14 2056  Full code   Continuous     09/30/14 2057           Consults psychiatry   DVT Prophylaxis  Xarelto  Lab Results  Component Value Date   PLT  99* 09/30/2014     Time Spent in minutes 35 minutes   Dustin Flock M.D on 10/01/2014 at 1:26 PM  Between 7am to 6pm - Pager - [phone]  After 6pm go to [email or web address] - password [email or web address] [facility]   Office  [phone]

## 2014-10-01 NOTE — ED Notes (Signed)
BEHAVIORAL HEALTH ROUNDING Patient sleeping: Yes.   Patient alert and oriented: not applicable Behavior appropriate: Yes.  ; If no, describe:  Nutrition and fluids offered: No Toileting and hygiene offered: No Sitter present: no Law enforcement present: No

## 2014-10-01 NOTE — ED Notes (Addendum)
ENVIRONMENTAL ASSESSMENT Potentially harmful objects out of patient reach: Yes.   Personal belongings secured: Yes.   Patient dressed in hospital provided attire only: Yes.   Plastic bags out of patient reach: Yes.   Patient care equipment (cords, cables, call bells, lines, and drains) shortened, removed, or accounted for: Yes.   Equipment and supplies removed from bottom of stretcher: No. Potentially toxic materials out of patient reach: Yes.   Sharps container removed or out of patient reach: Yes.     Pt still in ED room and required monitoring.  Cables accounted for.  Pt in gown.    BEHAVIORAL HEALTH ROUNDING Patient sleeping: Yes.   Patient alert and oriented: yes Behavior appropriate: Yes.  ; If no, describe:  Nutrition and fluids offered: No Toileting and hygiene offered: Yes  Sitter present: not applicable Law enforcement present: No  No sitter or law enforcement present at this time.  Charge nurse aware.  Staff aware that pt needs frequent monitoring.  Curtain open so pt is visible to staff passing by.

## 2014-10-02 ENCOUNTER — Observation Stay: Payer: Medicaid Other

## 2014-10-02 MED ORDER — OXYCODONE HCL 5 MG PO TABS
5.0000 mg | ORAL_TABLET | ORAL | Status: DC | PRN
  Administered 2014-10-02 – 2014-10-03 (×3): 5 mg via ORAL
  Filled 2014-10-02 (×3): qty 1

## 2014-10-02 MED ORDER — ZOLPIDEM TARTRATE 5 MG PO TABS
5.0000 mg | ORAL_TABLET | Freq: Every evening | ORAL | Status: DC | PRN
  Administered 2014-10-02: 5 mg via ORAL
  Filled 2014-10-02: qty 1

## 2014-10-02 MED ORDER — KETOROLAC TROMETHAMINE 30 MG/ML IJ SOLN
30.0000 mg | Freq: Four times a day (QID) | INTRAMUSCULAR | Status: DC | PRN
  Administered 2014-10-02 – 2014-10-03 (×4): 30 mg via INTRAVENOUS
  Filled 2014-10-02 (×4): qty 1

## 2014-10-02 NOTE — Progress Notes (Signed)
Went to complete shift assessment. Pt agitated and in pain. Demanding to see MD. MD notified. New orders given. Will continue to monitor.

## 2014-10-02 NOTE — Progress Notes (Signed)
[facility]                                                                                                                                                                                            Patient Demographics   Leslie Marks, is a 44 y.o. female, DOB - 1971-01-12, QQV:[id number]  Admit date - 09/30/2014   Admitting Physician Lytle Butte, MD  Outpatient Primary MD for the patient is No PCP Per Patient   LOS -   Subjective:  Patient still drowsy     Review of Systems:   CONSTITUTIONAL: No documented fever. No fatigue, weakness. No weight gain, no weight loss.  EYES: No blurry or double vision.  ENT: No tinnitus. No postnasal drip. No redness of the oropharynx.  RESPIRATORY: No cough, no wheeze, no hemoptysis. No dyspnea.  CARDIOVASCULAR: No chest pain. No orthopnea. No palpitations. No syncope.  GASTROINTESTINAL: No nausea, no vomiting or diarrhea. No abdominal pain. No melena or hematochezia.  GENITOURINARY: No dysuria or hematuria.  ENDOCRINE: No polyuria or nocturia. No heat or cold intolerance.  HEMATOLOGY: No anemia. No bruising. No bleeding.  INTEGUMENTARY: No rashes. No lesions.  MUSCULOSKELETAL: No arthritis. No swelling. No gout.  NEUROLOGIC: No numbness, tingling, or ataxia. No seizure-type activity.  PSYCHIATRIC: drowsy  Vitals:   Filed Vitals:   10/01/14 1957 10/02/14 0533 10/02/14 0533 10/02/14 0726  BP: 105/63 110/56 110/66 108/66  Pulse: 81  60 61  Temp: 98.2 F (36.8 C)  97.5 F (36.4 C)   TempSrc: Oral  Oral   Resp: 18  18 18   Height:      Weight:      SpO2: 97%  99% 100%    Wt Readings from Last 3 Encounters:  09/30/14 76.975 kg (169 lb 11.2 oz)  09/20/14 74.844 kg (165 lb)  05/26/13 86.183 kg (190 lb)     Intake/Output Summary (Last 24 hours) at 10/02/14 1059 Last data filed at 10/02/14 0900  Gross per 24 hour  Intake    840 ml  Output   1000 ml  Net   -160 ml    Physical  Exam:   GENERAL: Pleasant-appearing in no apparent distress.  HEAD, EYES, EARS, NOSE AND THROAT: Atraumatic, normocephalic. Extraocular muscles are intact. Pupils equal and reactive to light. Sclerae anicteric. No conjunctival injection. No oro-pharyngeal erythema.  NECK: Supple. There is no jugular venous distention. No bruits, no lymphadenopathy, no thyromegaly.  HEART: Regular rate and rhythm,. No murmurs, no rubs, no clicks.  LUNGS: Clear to auscultation bilaterally. No rales or rhonchi. No wheezes.  ABDOMEN: Soft,  flat, nontender, nondistended. Has good bowel sounds. No hepatosplenomegaly appreciated.  EXTREMITIES: No evidence of any cyanosis, clubbing, or peripheral edema.  +2 pedal and radial pulses bilaterally.  NEUROLOGIC: Drowsy , right upper ext weakness SKIN: Moist and warm with no rashes appreciated.  Psych: Drowsy LN: No inguinal LN enlargement    Antibiotics   Anti-infectives    None      Medications   Scheduled Meds: . bisacodyl  10 mg Oral Daily  . cholecalciferol  2,000 Units Oral Daily  . cloNIDine  0.1 mg Oral 3 times per day  . fluticasone  1 spray Each Nare Daily  . gabapentin  1,200 mg Oral TID  . magnesium oxide  400 mg Oral TID  . OLANZapine zydis  10 mg Oral QHS  . pantoprazole  40 mg Oral Daily  . propranolol  40 mg Oral TID  . rivaroxaban  20 mg Oral Q supper  . sodium chloride  3 mL Intravenous Q12H  . thiamine  100 mg Oral BID  . vitamin B-12  1,000 mcg Oral Daily   Continuous Infusions: . sodium chloride 100 mL/hr at 10/01/14 0333   PRN Meds:.acetaminophen **OR** acetaminophen, ondansetron **OR** ondansetron (ZOFRAN) IV   Data Review:   Micro Results No results found for this or any previous visit (from the past 240 hour(s)).  Radiology Reports Dg Chest 1 View  09/30/2014   CLINICAL DATA:  Recent drug overdose  EXAM: CHEST  1 VIEW  COMPARISON:  09/20/2014  FINDINGS: Cardiac shadow is within normal limits. Elevation of left  hemidiaphragm is again seen. Very minimal left basilar atelectasis is noted. No focal confluent infiltrate is noted. No bony abnormality is seen.  IMPRESSION: Minimal left basilar atelectasis.   Electronically Signed   By: Inez Catalina M.D.   On: 09/30/2014 19:08   Dg Chest 2 View  09/20/2014   CLINICAL DATA:  Chest pain extending into the right arm. Nausea. Unable to eat.  EXAM: CHEST - 2 VIEW  COMPARISON:  CT of the abdomen and pelvis 05/19/2013. Chest x-ray 04/26/2013.  FINDINGS: The heart size is normal. There is volume loss at the left base with elevation of left hemidiaphragm. The lungs are otherwise clear. The visualized soft tissues and bony thorax are unremarkable.  IMPRESSION: 1. Progressive elevation of the left hemidiaphragm. No clear etiology is evident. 2. Mild volume loss at the left base is associated.   Electronically Signed   By: San Morelle M.D.   On: 09/20/2014 16:23   Ct Head Wo Contrast  09/30/2014   CLINICAL DATA:  Drug overdose.  Found unresponsive.  EXAM: CT HEAD WITHOUT CONTRAST  TECHNIQUE: Contiguous axial images were obtained from the base of the skull through the vertex without intravenous contrast.  COMPARISON:  CT head without contrast 04/26/2013.  FINDINGS: No acute cortical infarct, hemorrhage, or mass lesion is present. The ventricles are of normal size. No significant extra-axial fluid collection is evident. The paranasal sinuses and mastoid air cells are clear. The calvarium is intact. The extracranial soft tissues are within normal limits. The globes and orbits are intact.  IMPRESSION: Negative CT of the head.   Electronically Signed   By: San Morelle M.D.   On: 09/30/2014 19:19   Ct Angio Chest Pe W/cm &/or Wo Cm  09/20/2014   CLINICAL DATA:  Chest pain, extending to the right upper extremity.  EXAM: CT ANGIOGRAPHY CHEST WITH CONTRAST  TECHNIQUE: Multidetector CT imaging of the chest was performed using the  standard protocol during bolus administration  of intravenous contrast. Multiplanar CT image reconstructions and MIPs were obtained to evaluate the vascular anatomy.  CONTRAST:  142mL OMNIPAQUE IOHEXOL 350 MG/ML SOLN  COMPARISON:  Chest radiograph September 20, 2014  FINDINGS: There is no demonstrable pulmonary embolus. There is no thoracic aortic aneurysm or dissection. Visualize great vessels are unremarkable.  There is elevation of the left hemidiaphragm. There is mild consolidation in the left lung base. There is patchy alveolar opacity in the lungs fairly diffusely, most likely representing edema. There is also interstitial edema in the bases.  Visualized thyroid appears normal. No appreciable thoracic adenopathy. There is a small pericardial effusion.  There is a fairly small hiatal hernia. In the visualized upper abdomen, there is a degree of hepatic steatosis.  There are no blastic or lytic bone lesions.  Review of the MIP images confirms the above findings.  IMPRESSION: No demonstrable pulmonary embolus. Evidence of a degree of congestive heart failure. Elevation left hemidiaphragm with mild consolidation left base. No adenopathy. Small pericardial effusion. Hepatic steatosis. Small hiatal hernia.   Electronically Signed   By: Lowella Grip III M.D.   On: 09/20/2014 19:27     CBC  Recent Labs Lab 09/30/14 1804  WBC 3.6  HGB 13.9  HCT 43.0  PLT 99*  MCV 89.9  MCH 29.0  MCHC 32.3  RDW 17.5*    Chemistries   Recent Labs Lab 09/30/14 1804  NA 144  K 4.6  CL 112*  CO2 23  GLUCOSE 74  BUN 18  CREATININE 0.91  CALCIUM 9.0  AST 26  ALT 15  ALKPHOS 139*  BILITOT 0.9   ------------------------------------------------------------------------------------------------------------------ estimated creatinine clearance is 88.7 mL/min (by C-G formula based on Cr of 0.91). ------------------------------------------------------------------------------------------------------------------ No results for input(s): HGBA1C in the last 72  hours. ------------------------------------------------------------------------------------------------------------------ No results for input(s): CHOL, HDL, LDLCALC, TRIG, CHOLHDL, LDLDIRECT in the last 72 hours. ------------------------------------------------------------------------------------------------------------------ No results for input(s): TSH, T4TOTAL, T3FREE, THYROIDAB in the last 72 hours.  Invalid input(s): FREET3 ------------------------------------------------------------------------------------------------------------------ No results for input(s): VITAMINB12, FOLATE, FERRITIN, TIBC, IRON, RETICCTPCT in the last 72 hours.  Coagulation profile No results for input(s): INR, PROTIME in the last 168 hours.  No results for input(s): DDIMER in the last 72 hours.  Cardiac Enzymes  Recent Labs Lab 09/30/14 1804  TROPONINI <0.03   ------------------------------------------------------------------------------------------------------------------ Invalid input(s): POCBNP    Assessment & Plan   44 year old Caucasian female history of chronic pain presenting with altered mental status.  1. Encephalopathy, acute: Due to secondary to benzodiazepine overdose . She still remains somnolent. She is   on IVC per her family,  psychiatry, neuro checks, avoid further sedating agents discontinue oxycodone, muscle relaxaant, check mri brain make sure not another cva 2. GERD without esophagitis: PPI therapy 3. Essential hypertension: Catapres stable 4. Chronic pain: hold sedating meds 5. Venous thromboembolism prophylactic: Therapeutic Xarelto       Code Status Orders        Start     Ordered   09/30/14 2056  Full code   Continuous     09/30/14 2057           Consults psychiatry   DVT Prophylaxis  Xarelto  Lab Results  Component Value Date   PLT 99* 09/30/2014     Time Spent in minutes 35 minutes   Dustin Flock M.D on 10/02/2014 at 10:59 AM  Between 7am  to 6pm - Pager - [phone]  After 6pm go to [email or web address] - password [email or web address]  [facility]   Office  [phone]

## 2014-10-02 NOTE — Progress Notes (Signed)
NSR. Room air. Arouses to voice. Neuro checks show no defeceit. Pt reported back pain and received toradol. Sitter at the bedside. Pt has no further concerns at this time.

## 2014-10-02 NOTE — Consult Note (Signed)
  S Went  to see pt for follow up. Staff reports that she [place] still lethargic "withdrawing from drugs." Pt went for MRI . O. Pt is still drowsy and is arousable but not much information is available. Rec Continue one on one observation for safety and re-eval pt in AM of 10/03/2014 for follow up

## 2014-10-03 MED ORDER — ENOXAPARIN SODIUM 80 MG/0.8ML ~~LOC~~ SOLN
1.0000 mg/kg | Freq: Two times a day (BID) | SUBCUTANEOUS | Status: DC
  Administered 2014-10-03: 75 mg via SUBCUTANEOUS
  Filled 2014-10-03: qty 0.8

## 2014-10-03 MED ORDER — CLONAZEPAM 1 MG PO TABS
2.0000 mg | ORAL_TABLET | Freq: Three times a day (TID) | ORAL | Status: DC
Start: 2014-10-03 — End: 2014-10-03
  Administered 2014-10-03: 2 mg via ORAL
  Filled 2014-10-03: qty 2

## 2014-10-03 MED ORDER — OLANZAPINE 10 MG PO TABS: 10.0000 mg | ORAL_TABLET | Freq: Every day | ORAL | Status: DC

## 2014-10-03 NOTE — Consult Note (Signed)
  Pt seen for Follow up Consult on 10/03/2014. Pt is getting ready to leave and is dressed up after a shower. Staff reports that pt is very manipulative. S Pt is a 44 yr old white female, not employed and is on disability for Fibromyalgia"  And today she reports that she came to the Hospital after taking "too many prescription pills." Pt reports that she took 4 of Zyprexa that has was prescribed by [facility] in [place] where she goes on out pt basis. And denies having had suicidal thoughts thought she has shown similar behavior in the past. Pt reports that she had her apt 3 days ago and her next apt is coming up in few days. She is eager for  Discharge and will be going and living with a a good friend who offered her a place to stay and he is not a boy friend. M.s. Adequately dressed up after a shower. Grooming is good with hair combed in style. No agitation. Alert and ox3. Calm pleasant and co-operative. Denies feeling depressed. Denies feeling h/h or w/u.  No psychosis. Denies a/v hallucinations or delusional or paranoid ideas. Cognition is intact. Denies s/h ideas or plans and contracts for safety and is eager to go home and keep follow up appts. Imp MDD Recurrent. Moderate. Poly substance abuse with O.d. Rec D/C One on one observation and suicide precautions and D/C IVC and discharge pt home and no prescriptions or meds. Pt will go to live with her friend and will keep follow up apt at [facility] and is scheduled for therapy where she will learn consequences of her behavior.

## 2014-10-03 NOTE — Discharge Instructions (Signed)
°  DIET:  Cardiac diet  DISCHARGE CONDITION:  Stable  ACTIVITY:  Activity as tolerated  OXYGEN:  Home Oxygen: No.   Oxygen Delivery: room air  DISCHARGE LOCATION:  home    ADDITIONAL DISCHARGE INSTRUCTION: pt needs to be careful with what meds she takes   If you experience worsening of your admission symptoms, develop shortness of breath, life threatening emergency, suicidal or homicidal thoughts you must seek medical attention immediately by calling 911 or calling your MD immediately  if symptoms less severe.  You Must read complete instructions/literature along with all the possible adverse reactions/side effects for all the Medicines you take and that have been prescribed to you. Take any new Medicines after you have completely understood and accpet all the possible adverse reactions/side effects.   Please note  You were cared for by a hospitalist during your hospital stay. If you have any questions about your discharge medications or the care you received while you were in the hospital after you are discharged, you can call the unit and asked to speak with the hospitalist on call if the hospitalist that took care of you is not available. Once you are discharged, your primary care physician will handle any further medical issues. Please note that NO REFILLS for any discharge medications will be authorized once you are discharged, as it is imperative that you return to your primary care physician (or establish a relationship with a primary care physician if you do not have one) for your aftercare needs so that they can reassess your need for medications and monitor your lab values.

## 2014-10-03 NOTE — Discharge Summary (Signed)
Leslie Marks, 44 y.o., DOB 11-21-1970, MRN [id number]. Admission date: 09/30/2014 Discharge Date 10/03/2014 Primary MD No PCP Per Patient Admitting Physician Lytle Butte, MD  Admission Diagnosis  Opiate abuse, continuous [F11.10] Benzodiazepine overdose, undetermined intent, initial encounter [T42.4X4A] Altered mental status, unspecified altered mental status type [R41.82]  Discharge Diagnosis   Principal Problem:   Encephalopathy due to unintentional overdose   Benzodiazepine overdose  Chronic pain syndrome Depression    . Chronic anticoagulation   . Pulmonary emboli 2007  . Chronic pain syndrome   . Chronic anxiety   . Non-diabetic hypoglycemia   . Chronic abdominal pain   . Lumbago   . Migraine   . Iron deficiency anemia   . B12 deficiency   . Fibromyalgia   . GERD (gastroesophageal reflux disease)   . Hypoglycemia   . Vitamin D deficiency   . Encephalopathy   . Opiate dependence   . Benzodiazepine dependence   . Overdose   . DVT (deep venous thrombosis)   . Asthma   . Collagen vascular disease   . CHF (congestive heart failure)               Leslie Marks is a 44 y.o. female with a known history of chronic pain, history of DVT presenting with altered mental status. Patient is on multiple pain medications and other sedating medications for anxiety depression. She was admitted with decrease in responsiveness she underwent MRI of the brain showed no evidence of CVA. Her medications were held. She also will be seen by psychiatry prior to her discharge. Patient needs to be cautious with her medications needs outpatient close follow-up with her primary care provider. From medical standpoint there is no further need to stay in the hospital.             Consults  psychiatry  Significant Tests:  See full reports for all details    Dg Chest 1 View  09/30/2014   CLINICAL  DATA:  Recent drug overdose  EXAM: CHEST  1 VIEW  COMPARISON:  09/20/2014  FINDINGS: Cardiac shadow is within normal limits. Elevation of left hemidiaphragm is again seen. Very minimal left basilar atelectasis is noted. No focal confluent infiltrate is noted. No bony abnormality is seen.  IMPRESSION: Minimal left basilar atelectasis.   Electronically Signed   By: Inez Catalina M.D.   On: 09/30/2014 19:08   Dg Chest 2 View  09/20/2014   CLINICAL DATA:  Chest pain extending into the right arm. Nausea. Unable to eat.  EXAM: CHEST - 2 VIEW  COMPARISON:  CT of the abdomen and pelvis 05/19/2013. Chest x-ray 04/26/2013.  FINDINGS: The heart size is normal. There is volume loss at the left base with elevation of left hemidiaphragm. The lungs are otherwise clear. The visualized soft tissues and bony thorax are unremarkable.  IMPRESSION: 1. Progressive elevation of the left hemidiaphragm. No clear etiology is evident. 2. Mild volume loss at the left base is associated.   Electronically Signed   By: San Morelle M.D.   On: 09/20/2014 16:23   Ct Head Wo Contrast  09/30/2014   CLINICAL DATA:  Drug overdose.  Found unresponsive.  EXAM: CT HEAD WITHOUT CONTRAST  TECHNIQUE: Contiguous axial images were obtained from the base of the skull through the vertex without intravenous contrast.  COMPARISON:  CT head without contrast 04/26/2013.  FINDINGS: No acute cortical infarct, hemorrhage, or mass lesion is present. The ventricles are of normal size. No significant extra-axial  fluid collection is evident. The paranasal sinuses and mastoid air cells are clear. The calvarium is intact. The extracranial soft tissues are within normal limits. The globes and orbits are intact.  IMPRESSION: Negative CT of the head.   Electronically Signed   By: San Morelle M.D.   On: 09/30/2014 19:19   Ct Angio Chest Pe W/cm &/or Wo Cm  09/20/2014   CLINICAL DATA:  Chest pain, extending to the right upper extremity.  EXAM: CT  ANGIOGRAPHY CHEST WITH CONTRAST  TECHNIQUE: Multidetector CT imaging of the chest was performed using the standard protocol during bolus administration of intravenous contrast. Multiplanar CT image reconstructions and MIPs were obtained to evaluate the vascular anatomy.  CONTRAST:  149mL OMNIPAQUE IOHEXOL 350 MG/ML SOLN  COMPARISON:  Chest radiograph September 20, 2014  FINDINGS: There is no demonstrable pulmonary embolus. There is no thoracic aortic aneurysm or dissection. Visualize great vessels are unremarkable.  There is elevation of the left hemidiaphragm. There is mild consolidation in the left lung base. There is patchy alveolar opacity in the lungs fairly diffusely, most likely representing edema. There is also interstitial edema in the bases.  Visualized thyroid appears normal. No appreciable thoracic adenopathy. There is a small pericardial effusion.  There is a fairly small hiatal hernia. In the visualized upper abdomen, there is a degree of hepatic steatosis.  There are no blastic or lytic bone lesions.  Review of the MIP images confirms the above findings.  IMPRESSION: No demonstrable pulmonary embolus. Evidence of a degree of congestive heart failure. Elevation left hemidiaphragm with mild consolidation left base. No adenopathy. Small pericardial effusion. Hepatic steatosis. Small hiatal hernia.   Electronically Signed   By: Lowella Grip III M.D.   On: 09/20/2014 19:27   Mr Brain Wo Contrast  10/02/2014   CLINICAL DATA:  Altered mental status. Personal history DVT. For overdose.  EXAM: MRI HEAD WITHOUT CONTRAST  TECHNIQUE: Multiplanar, multiecho pulse sequences of the brain and surrounding structures were obtained without intravenous contrast.  COMPARISON:  CT of the head without contrast 09/30/2014.  FINDINGS: No acute infarct, hemorrhage, or mass lesion is present. The ventricles are of normal size. No significant extraaxial fluid collection is present.  No significant white matter disease is  present. Flow is present in the major intracranial arteries. The globes and orbits are intact. Burn  The paranasal sinuses and mastoid air cells are clear.  IMPRESSION: Negative MRI of the brain.   Electronically Signed   By: San Morelle M.D.   On: 10/02/2014 18:38       Today   Subjective:   Leslie Marks  wants to be back on her pain medications  Objective:   Blood pressure 113/68, pulse 109, temperature 97.8 F (36.6 C), temperature source Oral, resp. rate 20, height 5\' 8"  (1.727 m), weight 76.975 kg (169 lb 11.2 oz), SpO2 99 %.  .  Intake/Output Summary (Last 24 hours) at 10/03/14 1418 Last data filed at 10/03/14 1351  Gross per 24 hour  Intake   3900 ml  Output   4325 ml  Net   -425 ml    Exam VITAL SIGNS: Blood pressure 113/68, pulse 109, temperature 97.8 F (36.6 C), temperature source Oral, resp. rate 20, height 5\' 8"  (1.727 m), weight 76.975 kg (169 lb 11.2 oz), SpO2 99 %.  GENERAL:  44 y.o.-year-old patient lying in the bed with no acute distress.  EYES: Pupils equal, round, reactive to light and accommodation. No scleral icterus. Extraocular muscles intact.  HEENT: Head atraumatic, normocephalic. Oropharynx and nasopharynx clear.  NECK:  Supple, no jugular venous distention. No thyroid enlargement, no tenderness.  LUNGS: Normal breath sounds bilaterally, no wheezing, rales,rhonchi or crepitation. No use of accessory muscles of respiration.  CARDIOVASCULAR: S1, S2 normal. No murmurs, rubs, or gallops.  ABDOMEN: Soft, nontender, nondistended. Bowel sounds present. No organomegaly or mass.  EXTREMITIES: No pedal edema, cyanosis, or clubbing.  NEUROLOGIC: Cranial nerves II through XII are intact. Marland Kitchen  PSYCHIATRIC: The patient is alert and oriented x 3.  SKIN: No obvious rash, lesion, or ulcer.   Data Review     CBC w Diff: Lab Results  Component Value Date   WBC 3.6 09/30/2014   HGB 13.9 09/30/2014   HCT 43.0 09/30/2014   PLT 99* 09/30/2014   LYMPHOPCT  29 05/26/2013   MONOPCT 13* 05/26/2013   EOSPCT 5 05/26/2013   BASOPCT 1 05/26/2013   CMP: Lab Results  Component Value Date   NA 144 09/30/2014   K 4.6 09/30/2014   CL 112* 09/30/2014   CO2 23 09/30/2014   BUN 18 09/30/2014   CREATININE 0.91 09/30/2014   PROT 6.4* 09/30/2014   ALBUMIN 3.5 09/30/2014   BILITOT 0.9 09/30/2014   ALKPHOS 139* 09/30/2014   AST 26 09/30/2014   ALT 15 09/30/2014  .  Micro Results No results found for this or any previous visit (from the past 240 hour(s)).      Code Status Orders        Start     Ordered   09/30/14 2056  Full code   Continuous     09/30/14 2057          Follow-up Information    Follow up with pcp In 7 days.      Please follow up.   Why:  patient already have appointment.      Discharge Medications     Medication List    TAKE these medications        cholecalciferol 1000 UNITS tablet  Commonly known as:  VITAMIN D  Take 2,000 Units by mouth daily.     clonazePAM 1 MG tablet  Commonly known as:  KLONOPIN  Take 1 mg by mouth 2 (two) times daily.     cyclobenzaprine 10 MG tablet  Commonly known as:  FLEXERIL  Take 10 mg by mouth 3 (three) times daily as needed.     enoxaparin 80 MG/0.8ML injection  Commonly known as:  LOVENOX  Inject 80 mg into the skin every 12 (twelve) hours.     esomeprazole 40 MG capsule  Commonly known as:  NEXIUM  Take 40 mg by mouth 2 (two) times daily.     fentaNYL 25 MCG/HR patch  Commonly known as:  DURAGESIC - dosed mcg/hr  Place 25 mcg onto the skin every 3 (three) days.     gabapentin 300 MG capsule  Commonly known as:  NEURONTIN  Take 300 mg by mouth 3 (three) times daily.     HYDROmorphone HCl 1 MG/ML Liqd  Commonly known as:  DILAUDID  Take 4 mLs by mouth every 4 (four) hours as needed for severe pain.     hydrOXYzine 50 MG tablet  Commonly known as:  ATARAX/VISTARIL  Take 50 mg by mouth every 12 (twelve) hours as needed.     OLANZapine 10 MG tablet   Commonly known as:  ZYPREXA  Take 20 mg by mouth 3 (three) times daily.     ondansetron 8 MG  tablet  Commonly known as:  ZOFRAN  Take 8 mg by mouth every 8 (eight) hours as needed for nausea or vomiting.     oxyCODONE 5 MG immediate release tablet  Commonly known as:  Oxy IR/ROXICODONE  Take 10-20 mg by mouth every 6 (six) hours as needed for severe pain.     propranolol 40 MG tablet  Commonly known as:  INDERAL  Take 40 mg by mouth 3 (three) times daily.     traZODone 150 MG tablet  Commonly known as:  DESYREL  Take 300 mg by mouth at bedtime.     vitamin B-12 1000 MCG tablet  Commonly known as:  CYANOCOBALAMIN  Take 1,000 mcg by mouth daily.     zolpidem 10 MG tablet  Commonly known as:  AMBIEN  Take 10 mg by mouth at bedtime.           Total Time in preparing paper work, data evaluation and todays exam - 35 minutes  Dustin Flock M.D on 10/03/2014 at 2:18 [facility]  [facility]   Office  [phone]

## 2014-10-03 NOTE — Care Management Note (Signed)
Case Management Note  Patient Details  Name: Leslie Marks MRN: [id number] Date of Birth: 08-09-1970  Subjective/Objective:      Clinical Social Worker Brayton Layman will discuss residence options with Leslie Marks per she has lost the housing that she had prior to this admission. Case management is available for any home health or DME needs.               Action/Plan:   Expected Discharge Date:                  Expected Discharge Plan:     In-House Referral:     Discharge planning Services     Post Acute Care Choice:    Choice offered to:     DME Arranged:    DME Agency:     HH Arranged:    [place] Agency:     Status of Service:     Medicare Important Message Given:    Date Medicare IM Given:    Medicare IM give by:    Date Additional Medicare IM Given:    Additional Medicare Important Message give by:     If discussed at [place] of Stay Meetings, dates discussed:    Additional Comments:  Leslie Marks A, RN 10/03/2014, 1:03 PM

## 2014-10-03 NOTE — Clinical Social Work Note (Signed)
Patient to discharge after psychiatry clearance. CSW informed by nurse that patient states she does not have a ride home. CSW met with patient who asked for at taxi voucher to [place] and then to [place]. CSW informed patient that we would be unable to assist with a taxi voucher to either of those two places. Patient informed CSW that she would try a friend. CSW came back some time later and she stated that the friend could not but that she would be able to find a ride. Shela Leff MSW,LCSW [phone]

## 2014-10-03 NOTE — Progress Notes (Addendum)
MD Challa saw pt and lifted IVC. Patel d/c pt. Abd dressing was redressed. Pt reported pain and received meds. IV and tele removed. Discharge instructions given. Belongings returned to pt. Pt has no further concerns at this time.

## 2014-11-20 DIAGNOSIS — R945 Abnormal results of liver function studies: Secondary | ICD-10-CM | POA: Insufficient documentation

## 2014-11-20 DIAGNOSIS — J4520 Mild intermittent asthma, uncomplicated: Secondary | ICD-10-CM | POA: Insufficient documentation

## 2014-11-20 DIAGNOSIS — R7989 Other specified abnormal findings of blood chemistry: Secondary | ICD-10-CM | POA: Insufficient documentation

## 2014-12-21 DIAGNOSIS — Z8673 Personal history of transient ischemic attack (TIA), and cerebral infarction without residual deficits: Secondary | ICD-10-CM | POA: Insufficient documentation

## 2015-04-20 DIAGNOSIS — Z79891 Long term (current) use of opiate analgesic: Secondary | ICD-10-CM | POA: Insufficient documentation

## 2015-04-20 DIAGNOSIS — F1120 Opioid dependence, uncomplicated: Secondary | ICD-10-CM | POA: Insufficient documentation

## 2015-10-06 DIAGNOSIS — Z8669 Personal history of other diseases of the nervous system and sense organs: Secondary | ICD-10-CM | POA: Insufficient documentation

## 2015-12-09 IMAGING — CT CT HEAD W/O CM
1 of 2 series · 13 of 30 positions shown, 17 images · non-contrast
Comparison: None.

CLINICAL DATA: Altered mental status

EXAM:
CT HEAD WITHOUT CONTRAST
TECHNIQUE: Contiguous axial images were obtained from the base of the skull
through the vertex without intravenous contrast.

[Series 2: headseq 4.8 h37s · axial · 0.55mm/px · z∈[+168,+326]mm · 13 of 36 slices shown, 17 images]
[im 3/36  brain]
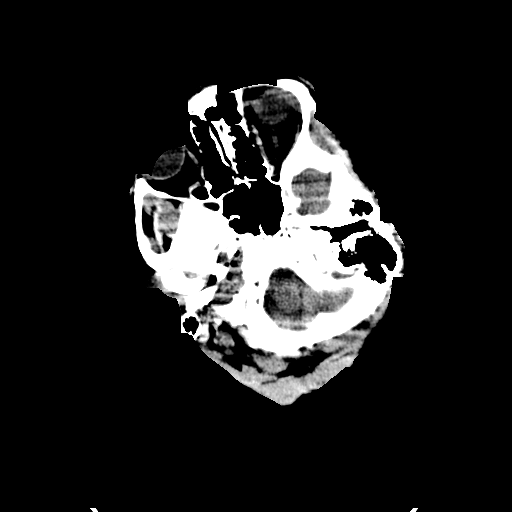
[im 3/36  bone]
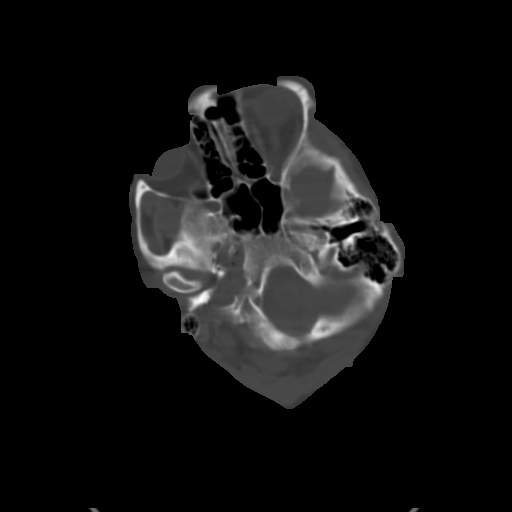
[im 6/36  brain]
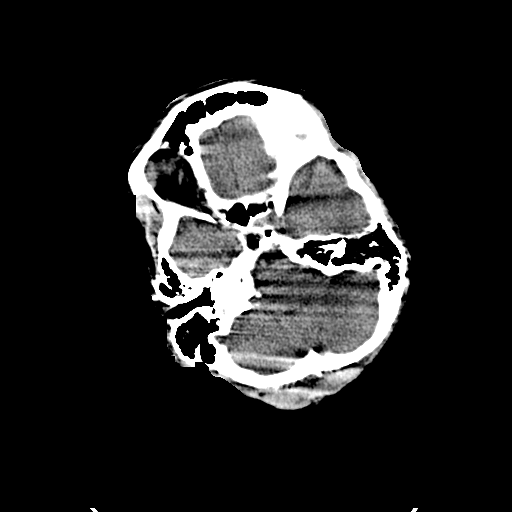
[im 8/36  brain]
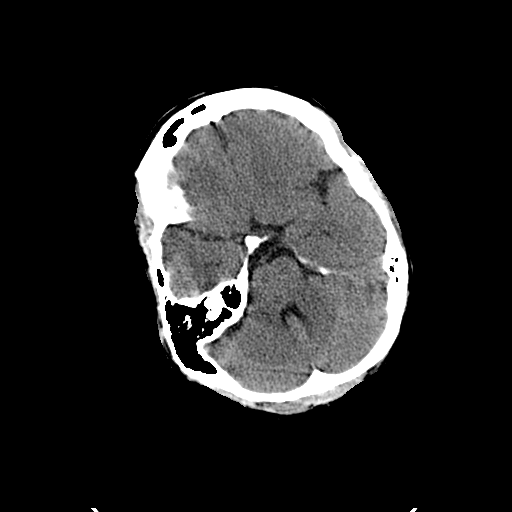
[im 11/36  brain]
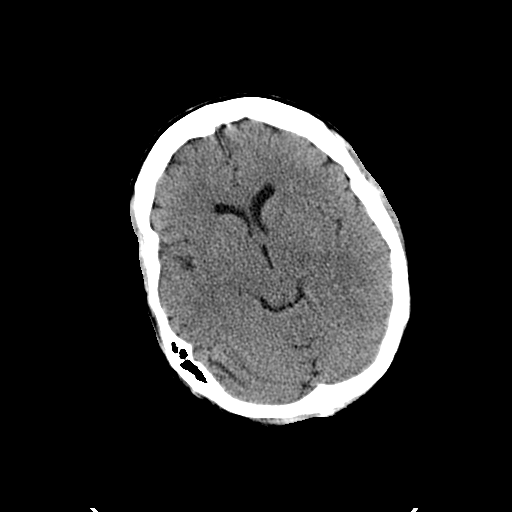
[im 13/36  brain]
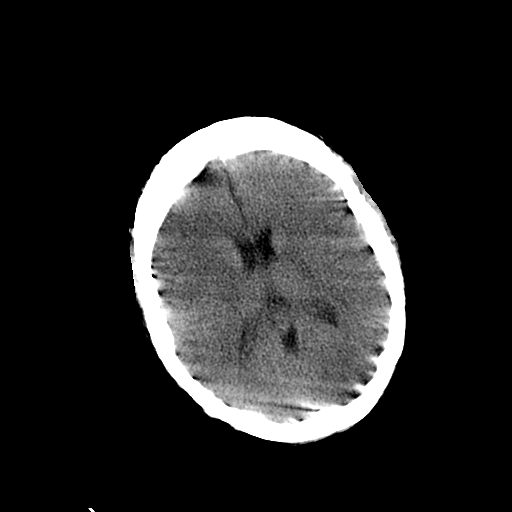
[im 13/36  bone]
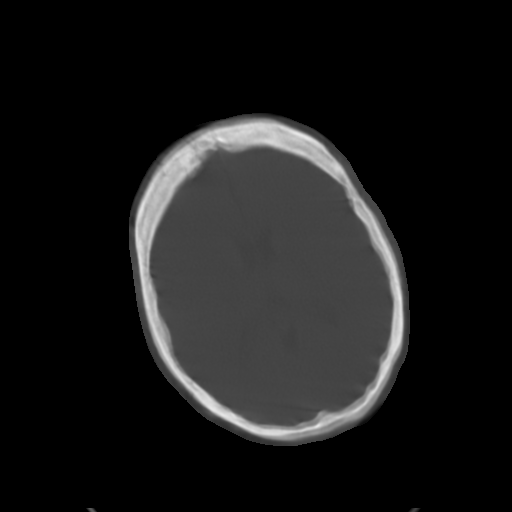
[im 16/36  brain]
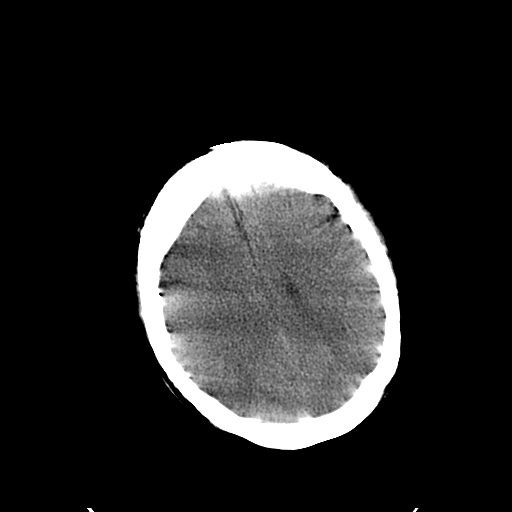
[im 18/36  brain]
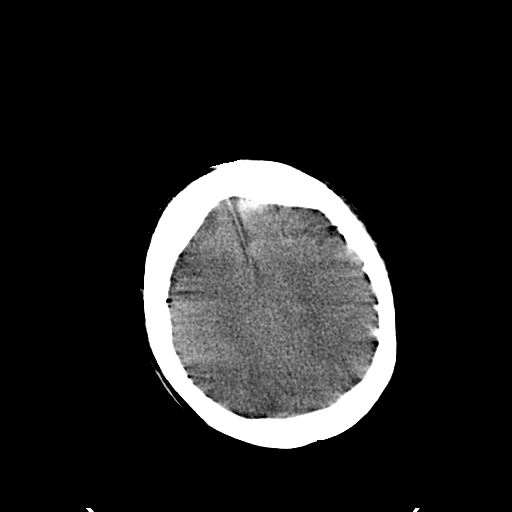
[im 21/36  brain]
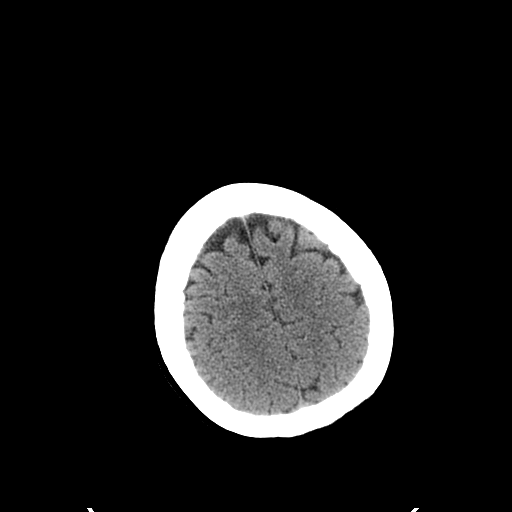
[im 23/36  brain]
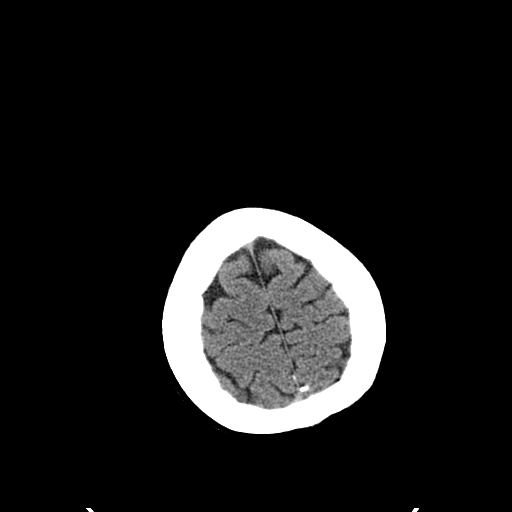
[im 23/36  bone]
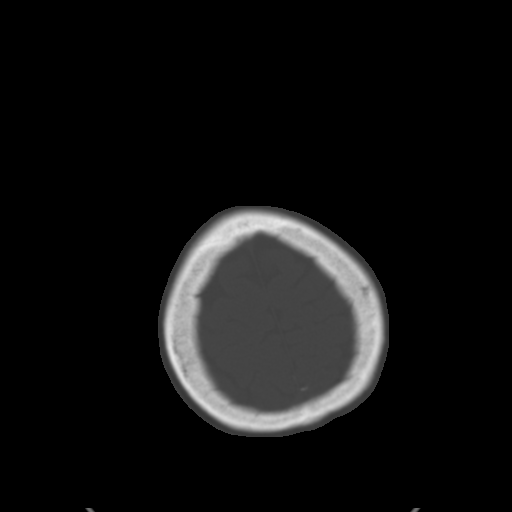
[im 26/36  brain]
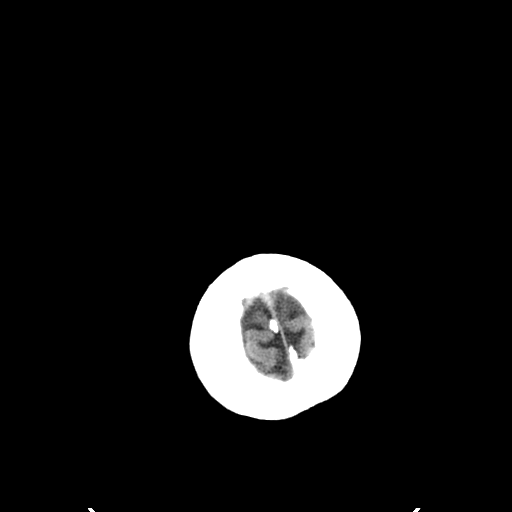
[im 28/36  brain]
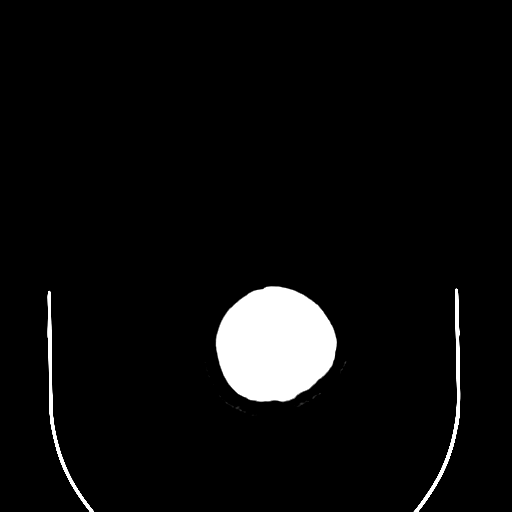
[im 31/36  brain]
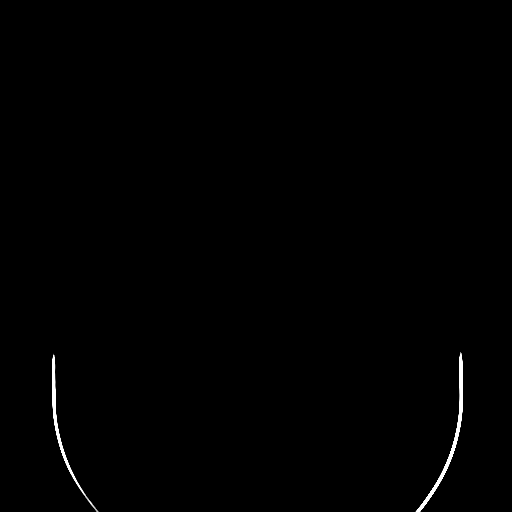
[im 33/36  brain]
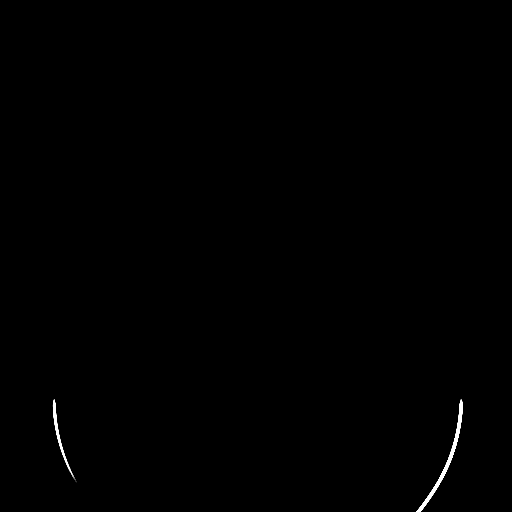
[im 33/36  bone]
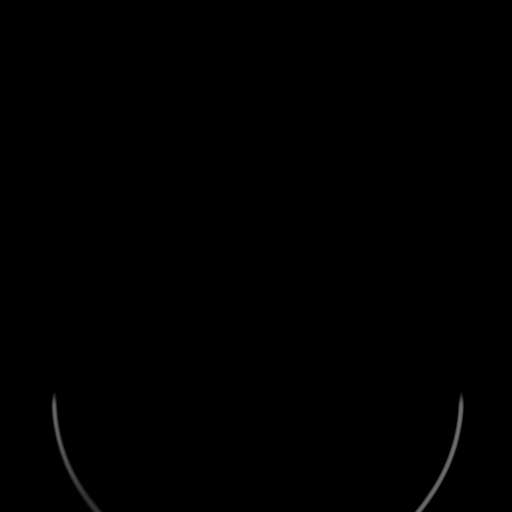

[13 of 30 positions shown; findings below may reference images not displayed]

FINDINGS: No CT evidence of acute infarction. No intraparenchymal hemorrhage,
mass, mass effect, or abnormal extra-axial fluid collection. The
ventricles, cisterns, and sulci are normal in size, shape, and
position. The visualized paranasal sinuses and mastoid air cells are
predominantly clear. No displaced calvarial fracture.
IMPRESSION: No CT evidence of an acute intracranial abnormality.

Recommend MRI if concern for acute intracranial pathology persists.

## 2015-12-10 IMAGING — CR DG CHEST 1V PORT
1 series · 1 of 1 positions shown · non-contrast
Comparison: none

[portable]
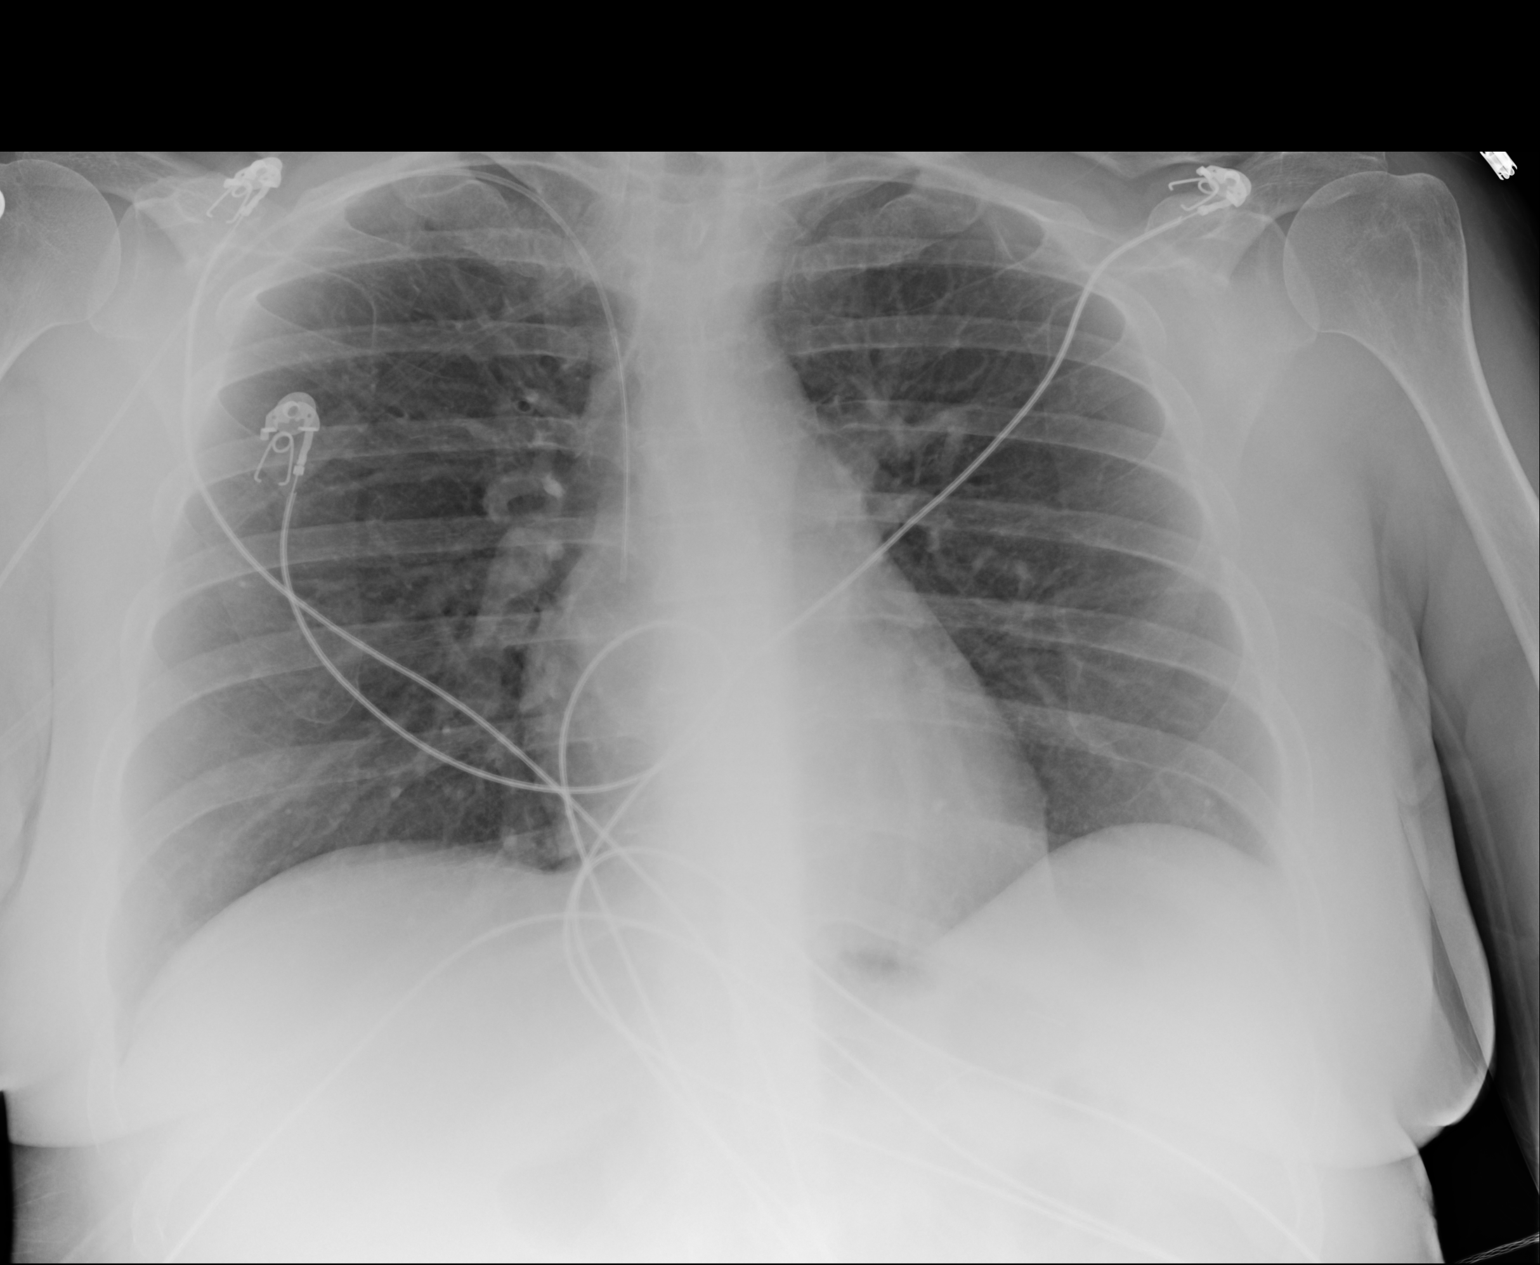

[1 of 1 positions shown; findings below may reference images not displayed]

CLINICAL DATA
Status post PICC line placement

EXAM
PORTABLE CHEST - 1 VIEW

COMPARISON
DG CHEST 1V PORT dated 04/26/2013

FINDINGS
The patient has undergone placement of a PICC line via the right
upper extremity. The tip of the catheter lies in the region of the
midportion of the SVC. The lungs are borderline hypoinflated but
clear. There is no pneumothorax or pleural effusion or infiltrate.
The cardiopericardial silhouette is normal in size. The pulmonary
vascularity is not engorged.

IMPRESSION
There is no evidence of a postprocedure complication following
placement of a right-sided PICC line.

SIGNATURE

## 2016-03-11 ENCOUNTER — Emergency Department: Payer: Medicaid Other

## 2016-03-11 ENCOUNTER — Inpatient Hospital Stay
Admission: EM | Admit: 2016-03-11 | Discharge: 2016-03-13 | DRG: 918 | Disposition: A | Discharge status: Home / Self Care | Payer: Medicaid Other | Attending: Internal Medicine | Admitting: Internal Medicine

## 2016-03-11 ENCOUNTER — Encounter: Payer: Self-pay | Admitting: Emergency Medicine

## 2016-03-11 DIAGNOSIS — Z818 Family history of other mental and behavioral disorders: Secondary | ICD-10-CM

## 2016-03-11 DIAGNOSIS — Z86718 Personal history of other venous thrombosis and embolism: Secondary | ICD-10-CM

## 2016-03-11 DIAGNOSIS — E876 Hypokalemia: Secondary | ICD-10-CM | POA: Diagnosis present

## 2016-03-11 DIAGNOSIS — R1084 Generalized abdominal pain: Secondary | ICD-10-CM | POA: Diagnosis present

## 2016-03-11 DIAGNOSIS — G4700 Insomnia, unspecified: Secondary | ICD-10-CM | POA: Diagnosis present

## 2016-03-11 DIAGNOSIS — Z9189 Other specified personal risk factors, not elsewhere classified: Secondary | ICD-10-CM | POA: Diagnosis present

## 2016-03-11 DIAGNOSIS — I69851 Hemiplegia and hemiparesis following other cerebrovascular disease affecting right dominant side: Secondary | ICD-10-CM

## 2016-03-11 DIAGNOSIS — I110 Hypertensive heart disease with heart failure: Secondary | ICD-10-CM | POA: Diagnosis present

## 2016-03-11 DIAGNOSIS — M797 Fibromyalgia: Secondary | ICD-10-CM | POA: Diagnosis present

## 2016-03-11 DIAGNOSIS — Y92019 Unspecified place in single-family (private) house as the place of occurrence of the external cause: Secondary | ICD-10-CM

## 2016-03-11 DIAGNOSIS — F1121 Opioid dependence, in remission: Secondary | ICD-10-CM | POA: Diagnosis present

## 2016-03-11 DIAGNOSIS — I5032 Chronic diastolic (congestive) heart failure: Secondary | ICD-10-CM | POA: Diagnosis present

## 2016-03-11 DIAGNOSIS — Z915 Personal history of self-harm: Secondary | ICD-10-CM

## 2016-03-11 DIAGNOSIS — Z9049 Acquired absence of other specified parts of digestive tract: Secondary | ICD-10-CM

## 2016-03-11 DIAGNOSIS — Z881 Allergy status to other antibiotic agents status: Secondary | ICD-10-CM

## 2016-03-11 DIAGNOSIS — T402X1A Poisoning by other opioids, accidental (unintentional), initial encounter: Principal | ICD-10-CM | POA: Diagnosis present

## 2016-03-11 DIAGNOSIS — K219 Gastro-esophageal reflux disease without esophagitis: Secondary | ICD-10-CM | POA: Diagnosis present

## 2016-03-11 DIAGNOSIS — T40601S Poisoning by unspecified narcotics, accidental (unintentional), sequela: Secondary | ICD-10-CM

## 2016-03-11 DIAGNOSIS — Z7901 Long term (current) use of anticoagulants: Secondary | ICD-10-CM

## 2016-03-11 DIAGNOSIS — Z888 Allergy status to other drugs, medicaments and biological substances status: Secondary | ICD-10-CM

## 2016-03-11 DIAGNOSIS — G894 Chronic pain syndrome: Secondary | ICD-10-CM | POA: Diagnosis present

## 2016-03-11 DIAGNOSIS — F418 Other specified anxiety disorders: Secondary | ICD-10-CM | POA: Diagnosis present

## 2016-03-11 DIAGNOSIS — F1321 Sedative, hypnotic or anxiolytic dependence, in remission: Secondary | ICD-10-CM | POA: Diagnosis present

## 2016-03-11 DIAGNOSIS — Z9884 Bariatric surgery status: Secondary | ICD-10-CM

## 2016-03-11 DIAGNOSIS — Z86711 Personal history of pulmonary embolism: Secondary | ICD-10-CM

## 2016-03-11 DIAGNOSIS — Z880 Allergy status to penicillin: Secondary | ICD-10-CM

## 2016-03-11 DIAGNOSIS — Z91048 Other nonmedicinal substance allergy status: Secondary | ICD-10-CM

## 2016-03-11 DIAGNOSIS — Z91040 Latex allergy status: Secondary | ICD-10-CM

## 2016-03-11 DIAGNOSIS — R262 Difficulty in walking, not elsewhere classified: Secondary | ICD-10-CM

## 2016-03-11 LAB — COMPREHENSIVE METABOLIC PANEL
ALK PHOS: 139 U/L — AB (ref 38–126)
ALT: 16 U/L (ref 14–54)
AST: 23 U/L (ref 15–41)
Albumin: 3.4 g/dL — ABNORMAL LOW (ref 3.5–5.0)
Anion gap: 5 (ref 5–15)
BILIRUBIN TOTAL: 0.5 mg/dL (ref 0.3–1.2)
BUN: 9 mg/dL (ref 6–20)
CALCIUM: 8.5 mg/dL — AB (ref 8.9–10.3)
CHLORIDE: 105 mmol/L (ref 101–111)
CO2: 24 mmol/L (ref 22–32)
Creatinine, Ser: 1.11 mg/dL — ABNORMAL HIGH (ref 0.44–1.00)
GFR calc Af Amer: >60 mL/min (ref >60)
GFR, EST NON AFRICAN AMERICAN: 60 mL/min — AB (ref >60)
Glucose, Bld: 95 mg/dL (ref 65–99)
Potassium: 3.3 mmol/L — ABNORMAL LOW (ref 3.5–5.1)
Sodium: 134 mmol/L — ABNORMAL LOW (ref 135–145)
Total Protein: 6.5 g/dL (ref 6.5–8.1)

## 2016-03-11 LAB — CBC
HCT: 34.6 % — ABNORMAL LOW (ref 35.0–47.0)
Hemoglobin: 11.6 g/dL — ABNORMAL LOW (ref 12.0–16.0)
MCH: 29.7 pg (ref 26.0–34.0)
MCHC: 33.4 g/dL (ref 32.0–36.0)
MCV: 88.9 fL (ref 80.0–100.0)
PLATELETS: 294 10*3/uL (ref 150–440)
RBC: 3.89 MIL/uL (ref 3.80–5.20)
RDW: 14.2 % (ref 11.5–14.5)
WBC: 10.3 10*3/uL (ref 3.6–11.0)

## 2016-03-11 LAB — ETHANOL

## 2016-03-11 LAB — URINE DRUG SCREEN, QUALITATIVE (ARMC ONLY)
Amphetamines, Ur Screen: NOT DETECTED
Barbiturates, Ur Screen: NOT DETECTED
Benzodiazepine, Ur Scrn: NOT DETECTED
CANNABINOID 50 NG, UR ~~LOC~~: NOT DETECTED
COCAINE METABOLITE, UR ~~LOC~~: NOT DETECTED
MDMA (ECSTASY) UR SCREEN: NOT DETECTED
Methadone Scn, Ur: NOT DETECTED
Opiate, Ur Screen: POSITIVE — AB
Phencyclidine (PCP) Ur S: NOT DETECTED
Tricyclic, Ur Screen: NOT DETECTED

## 2016-03-11 LAB — SALICYLATE LEVEL: Salicylate Lvl: <7 mg/dL (ref 2.8–30.0)

## 2016-03-11 LAB — ACETAMINOPHEN LEVEL: Acetaminophen (Tylenol), Serum: <10 ug/mL — ABNORMAL LOW (ref 10–30)

## 2016-03-11 MED ORDER — NALOXONE HCL 2 MG/2ML IJ SOSY
0.8000 mg | PREFILLED_SYRINGE | Freq: Once | INTRAMUSCULAR | Status: AC
  Administered 2016-03-11: 0.8 mg via INTRAVENOUS

## 2016-03-11 MED ORDER — SODIUM CHLORIDE 0.9 % IV BOLUS (SEPSIS)
1000.0000 mL | Freq: Once | INTRAVENOUS | Status: AC
  Administered 2016-03-11: 1000 mL via INTRAVENOUS

## 2016-03-11 MED ORDER — NALOXONE HCL 2 MG/2ML IJ SOSY: 1.0000 mg | PREFILLED_SYRINGE | Freq: Once | INTRAMUSCULAR | Status: DC

## 2016-03-11 MED ORDER — NALOXONE HCL 2 MG/2ML IJ SOSY
0.5000 mg/h | PREFILLED_SYRINGE | INTRAVENOUS | Status: DC
  Administered 2016-03-11: 0.5 mg/h via INTRAVENOUS
  Filled 2016-03-11: qty 4

## 2016-03-11 MED ORDER — NALOXONE HCL 2 MG/2ML IJ SOSY
PREFILLED_SYRINGE | INTRAMUSCULAR | Status: AC
  Administered 2016-03-11: 0.8 mg via INTRAVENOUS
  Filled 2016-03-11: qty 2

## 2016-03-11 NOTE — ED Notes (Signed)
Pt aroused and is fully alert and oriented - denies taking any medication or having her son give her any medication while he was her - when pt awoke she immediately began feeling in her pockets and asking where her pills were - she stated that she had pills in her pocket that were no longer there - this was reported to Dr Quentin Cornwall

## 2016-03-11 NOTE — ED Notes (Signed)
Pt pockets checked - she has a piece of paper and one yellow colored hoop earring

## 2016-03-11 NOTE — ED Triage Notes (Signed)
Patient states went to PCP today and heart rate was 135.  Patient states "I have an accidental overdose today.  Nycenta and Oxycodone".  Patient states she took 4 tablets of nycenta and 4 tablets of Oxycodone 10 mg.  Pills taken at 1330 today.

## 2016-03-11 NOTE — ED Notes (Signed)
Pt unable to stay awake - eyes rolling back in head and mouth falling open - pt responds appropriately to verbal and physical stimuli but immediately goes back to eyes rolling back in head and snoring state if left alone - Dr Quentin Cornwall notified - VO given for Narcan .08

## 2016-03-11 NOTE — ED Provider Notes (Signed)
[facility] Emergency Department Provider Note    First MD Initiated Contact with Patient 03/11/16 1600     (approximate)  I have reviewed the triage vital signs and the nursing notes.   HISTORY  Chief Complaint Drug Overdose    HPI Leslie Marks is a 46 y.o. female status post open cholecystectomy mild partial bowel resection performed at [facility] presents after wound care appointment where she had staples removed due to worsening pain patient took additional oxycodone 10 mg. Patient states that she is on ice into and took 4 tablets of that and 4 of her 10 mg ox ease at roughly 1330 today. States that she had no intent for self-harm. She states that she was just in excruciating pain. Her roommate called EMS when she found her with altered mental status and decreased respirations. EMS arrived and provided 2 mg of intranasal Narcan with improvement and respiratory rate. Patient arrives to the ER just complaining of chronic abdominal pain status post surgery. Denies any SI or HI.   Past Medical History:  Diagnosis Date  . Asthma   . B12 deficiency   . Benzodiazepine dependence ([place])   . CHF (congestive heart failure) ([place])   . Chronic abdominal pain   . Chronic anticoagulation   . Chronic anxiety   . Chronic pain syndrome   . Collagen vascular disease ([place])   . Depression   . DVT (deep venous thrombosis) ([place])   . Encephalopathy   . Fibromyalgia   . GERD (gastroesophageal reflux disease)   . Hypoglycemia   . Iron deficiency anemia   . Lumbago   . Migraine   . Non-diabetic hypoglycemia   . Opiate dependence ([place])   . Overdose   . Pulmonary emboli ([place]) 2007  . Vitamin D deficiency    Family History  Problem Relation Age of Onset  . Heart failure Neg Hx    Past Surgical History:  Procedure Laterality Date  . ABDOMINAL HYSTERECTOMY    . APPENDECTOMY    . CESAREAN SECTION    . CHOLECYSTECTOMY    . GASTRIC BYPASS  2003  . TONSILLECTOMY      Patient Active Problem List   Diagnosis Date Noted  . Benzodiazepine overdose 09/30/2014  . Encephalopathy 09/30/2014  . 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 Malabsorption syndrome 05/27/2013  . Cholelithiasis 05/12/2013  . Lupus anticoagulant positive 04/29/2013  . 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 Altered mental status 04/27/2013  . Pulmonary emboli ([place]) 04/27/2013  . Chronic pain syndrome 04/27/2013  . Chronic anxiety 04/27/2013  . Non-diabetic hypoglycemia 04/27/2013  . Acute encephalopathy 04/26/2013  . Depression 04/26/2013  . Chronic anticoagulation 04/26/2013  . Pancytopenia ([place]) 04/26/2013      Prior to Admission medications   Medication Sig Start Date End Date Taking? Authorizing Provider  cholecalciferol (VITAMIN D) 1000 UNITS tablet Take 2,000 Units by mouth daily.    Historical Provider, MD  clonazePAM (KLONOPIN) 1 MG tablet Take 1 mg by mouth 2 (two) times daily.    Historical Provider, MD  cyclobenzaprine (FLEXERIL) 10 MG tablet Take 10 mg by mouth 3 (three) times daily as needed.     Historical Provider, MD  enoxaparin (LOVENOX) 80 MG/0.8ML injection Inject 80 mg into the skin every 12 (twelve) hours.    Historical Provider, MD  esomeprazole (NEXIUM) 40 MG capsule Take 40 mg by mouth 2 (two) times daily.    Historical Provider,  MD  fentaNYL (DURAGESIC - DOSED MCG/HR) 25 MCG/HR patch Place 25 mcg onto the skin every 3 (three) days.    Historical Provider, MD  gabapentin (NEURONTIN) 300 MG capsule Take 300 mg by mouth 3 (three) times daily.     Historical Provider, MD  HYDROmorphone HCl (DILAUDID) 1 MG/ML LIQD Take 4 mLs by mouth every 4 (four) hours as needed for severe pain.    Historical Provider, MD  hydrOXYzine (ATARAX/VISTARIL) 50 MG tablet Take 50 mg by mouth every 12 (twelve) hours as needed.    Historical Provider, MD  OLANZapine (ZYPREXA) 10 MG tablet Take 20 mg by mouth 3 (three) times daily.     Historical Provider, MD  ondansetron (ZOFRAN) 8 MG tablet Take 8 mg by mouth every 8 (eight) hours as needed for nausea or vomiting.    Historical Provider, MD  oxyCODONE (OXY IR/ROXICODONE) 5 MG immediate release tablet Take 10-20 mg by mouth every 6 (six) hours as needed for severe pain.    Historical Provider, MD  propranolol (INDERAL) 40 MG tablet Take 40 mg by mouth 3 (three) times daily.    Historical Provider, MD  traZODone (DESYREL) 150 MG tablet Take 300 mg by mouth at bedtime.    Historical Provider, MD  vitamin B-12 (CYANOCOBALAMIN) 1000 MCG tablet Take 1,000 mcg by mouth daily.    Historical Provider, MD  zolpidem (AMBIEN) 10 MG tablet Take 10 mg by mouth at bedtime.    Historical Provider, MD    Allergies Amoxicillin; Augmentin [amoxicillin-pot clavulanate]; Betadine [povidone iodine]; Ciprofloxacin; Erythromycin; Latex; Penicillins; Adhesive [tape]; and Lyrica [pregabalin]    Social History Social History  Substance Use Topics  . Smoking status: Never Smoker  . Smokeless tobacco: Never Used  . Alcohol use No    Review of Systems Patient denies headaches, rhinorrhea, blurry vision, numbness, shortness of breath, chest pain, edema, cough, abdominal pain, nausea, vomiting, diarrhea, dysuria, fevers, rashes or hallucinations unless otherwise stated above in HPI. ____________________________________________   PHYSICAL EXAM:  VITAL SIGNS: Vitals:   03/11/16 1544 03/11/16 2047  BP: (!) 80/53 (!) 151/80  Pulse: 76 (!) 102  Resp: 16 15  Temp: 97.8 F (36.6 C)     Constitutional: Alert and oriented.  in no acute distress. Eyes: Conjunctivae are normal. PERRL. EOMI. Head: Atraumatic. Nose: No congestion/rhinnorhea. Mouth/Throat: Mucous membranes are moist.  Oropharynx non-erythematous. Neck: No stridor. Painless ROM. No cervical spine tenderness to palpation Hematological/Lymphatic/Immunilogical: No cervical lymphadenopathy. Cardiovascular: Normal rate, regular  rhythm. Grossly normal heart sounds.  Good peripheral circulation. Respiratory: Normal respiratory effort.  No retractions. Lungs CTAB. Gastrointestinal: Soft diffusely ttp without peritonitis,  Appears appropriate post op.  Midline incsion c/d/i. No distention. No abdominal bruits. No CVA tenderness.   Musculoskeletal: No lower extremity tenderness nor edema.  No joint effusions. Neurologic:  Normal speech and language. No gross focal neurologic deficits are appreciated. No gait instability. Skin:  Skin is warm, dry and intact. No rash noted. Psychiatric: Mood and affect are normal. Speech and behavior are normal.  ____________________________________________   LABS (all labs ordered are listed, but only abnormal results are displayed)  Results for orders placed or performed during the hospital encounter of 03/11/16 (from the past 24 hour(s))  Comprehensive metabolic panel     Status: Abnormal   Collection Time: 03/11/16  4:02 PM  Result Value Ref Range   Sodium 134 (L) 135 - 145 mmol/L   Potassium 3.3 (L) 3.5 - 5.1 mmol/L   Chloride 105 101 - 111  mmol/L   CO2 24 22 - 32 mmol/L   Glucose, Bld 95 65 - 99 mg/dL   BUN 9 6 - 20 mg/dL   Creatinine, Ser 1.11 (H) 0.44 - 1.00 mg/dL   Calcium 8.5 (L) 8.9 - 10.3 mg/dL   Total Protein 6.5 6.5 - 8.1 g/dL   Albumin 3.4 (L) 3.5 - 5.0 g/dL   AST 23 15 - 41 U/L   ALT 16 14 - 54 U/L   Alkaline Phosphatase 139 (H) 38 - 126 U/L   Total Bilirubin 0.5 0.3 - 1.2 mg/dL   GFR calc non Af Amer 60 (L) >60 mL/min   GFR calc Af Amer >60 >60 mL/min   Anion gap 5 5 - 15  Ethanol     Status: None   Collection Time: 03/11/16  4:02 PM  Result Value Ref Range   Alcohol, Ethyl (B) <5 <5 mg/dL  Salicylate level     Status: None   Collection Time: 03/11/16  4:02 PM  Result Value Ref Range   Salicylate Lvl [id number] 2.8 - 30.0 mg/dL  Acetaminophen level     Status: Abnormal   Collection Time: 03/11/16  4:02 PM  Result Value Ref Range   Acetaminophen (Tylenol),  Serum <10 (L) 10 - 30 ug/mL  cbc     Status: Abnormal   Collection Time: 03/11/16  4:02 PM  Result Value Ref Range   WBC 10.3 3.6 - 11.0 K/uL   RBC 3.89 3.80 - 5.20 MIL/uL   Hemoglobin 11.6 (L) 12.0 - 16.0 g/dL   HCT 34.6 (L) 35.0 - 47.0 %   MCV 88.9 80.0 - 100.0 fL   MCH 29.7 26.0 - 34.0 pg   MCHC 33.4 32.0 - 36.0 g/dL   RDW 14.2 11.5 - 14.5 %   Platelets 294 150 - 440 K/uL  Urine Drug Screen, Qualitative     Status: Abnormal   Collection Time: 03/11/16  4:18 PM  Result Value Ref Range   Tricyclic, Ur Screen NONE DETECTED NONE DETECTED   Amphetamines, Ur Screen NONE DETECTED NONE DETECTED   MDMA (Ecstasy)Ur Screen NONE DETECTED NONE DETECTED   Cocaine Metabolite,Ur [place] NONE DETECTED NONE DETECTED   Opiate, Ur Screen POSITIVE (A) NONE DETECTED   Phencyclidine (PCP) Ur S NONE DETECTED NONE DETECTED   Cannabinoid 50 Ng, Ur [place] NONE DETECTED NONE DETECTED   Barbiturates, Ur Screen NONE DETECTED NONE DETECTED   Benzodiazepine, Ur Scrn NONE DETECTED NONE DETECTED   Methadone Scn, Ur NONE DETECTED NONE DETECTED  Acetaminophen level     Status: Abnormal   Collection Time: 03/11/16  7:30 PM  Result Value Ref Range   Acetaminophen (Tylenol), Serum <10 (L) 10 - 30 ug/mL   ____________________________________________  EKG____________________________________________  RADIOLOGY   ____________________________________________   PROCEDURES  Procedure(s) performed:  Procedures    Critical Care performed: no ____________________________________________   INITIAL IMPRESSION / ASSESSMENT AND PLAN / ED COURSE  Pertinent labs & imaging results that were available during my care of the patient were reviewed by me and considered in my medical decision making (see chart for details).  DDX: Dehydration, sepsis, pna, uti, hypoglycemia, cva, drug effect, withdrawal, encephalitis   Leslie Marks is a 46 y.o. who presents to the ED with Acid overdose of on pain medications around 3:00.  Patient denies any SI or HI. Did have improvement after Narcan. We will check labs as well as observe patient here in the Er.  She states that she denies any other  coingestions and but states that they were Percocet 10 mg therefore we'll check a Tylenol level to confirm that there is no elevation. Patient states that she's also been on Nucynta immediate release. We will touch base with poison control.     Clinical Course as of Mar 11 2120  Mon Mar 11, 2016  1804 Patient rechecked. She was sleeping but easily awakens to voice. No respiratory suppression. We'll continue to monitor. We will repeat Tylenol level at the 4 hour interval  [PR]  2006 Patient drowsy and does still appear intoxicated but with normal respirations and hemodynamically stable. Easily arouses to voice. Spoke with poison control who recommends 6 hours of observation based on the dosing of Nucynta and oxycodone.  [PR]  2035 Patient becoming persistently drowsy. Will redosed Narcan.  [PR]  2050 Patient given Narcan with improvement in her drowsiness. And awoke and was immediately searching and asking where her "pill bottle" was.  At this point after persistent observation in the ER I am suspicious the patient may have taken additional narcotic medication. Will further broad differential to evaluate with CT head to evaluate her acute encephalopathy. I do not feel is consistent with seizure. We'll continue to monitor patient.  [PR]    Clinical Course User Index [PR] Merlyn Lot, MD   ----------------------------------------- 9:25 PM on 03/11/2016 -----------------------------------------  On evaluation of the [facility] prescription monitoring program and does confirm the patient is on extended release. Muscle suspicious patient may have ingested additional pain medications in the ER. Based on this a do feel patient will require admission for pulse oximetry and monitoring and Narcan when  necessary.  ____________________________________________   FINAL CLINICAL IMPRESSION(S) / ED DIAGNOSES  Final diagnoses:  Opiate overdose, accidental or unintentional, sequela      NEW MEDICATIONS STARTED DURING THIS VISIT:  New Prescriptions   No medications on file     Note:  This document was prepared using Dragon voice recognition software and may include unintentional dictation errors.    Merlyn Lot, MD 03/11/16 2126

## 2016-03-11 NOTE — ED Notes (Signed)
Pt reported to housekeeping staff that her debit card was missing from her pants pocket - nurse asked pt if she had let son borrow card when he left - she denies this

## 2016-03-11 NOTE — ED Notes (Signed)
Pt had abd surgery 12/12 and today after MD appt she was hurting and went home and took 4 10mg  percocet and 4 200mg  nucynta - pt denies suicidal ideation and states she just wanted to stop her abd from hurting and the staples from pulling

## 2016-03-11 NOTE — ED Notes (Signed)
Admitting MD at bedside. Pt given more water.

## 2016-03-11 NOTE — ED Triage Notes (Signed)
Pt arrived via EMS from home. Pt reports accidental overdose on pain medications approximately 45 minutes PTA. EMS administered Narcan 2 mg IN. EMS reports 110/68, 70 HR, CBG 110. Pt alert on arrival, reports new onset chest pain.

## 2016-03-11 NOTE — ED Notes (Signed)
Pharmacy called to send narcan infusion

## 2016-03-12 DIAGNOSIS — Z9884 Bariatric surgery status: Secondary | ICD-10-CM | POA: Diagnosis not present

## 2016-03-12 DIAGNOSIS — Z9189 Other specified personal risk factors, not elsewhere classified: Secondary | ICD-10-CM | POA: Diagnosis present

## 2016-03-12 DIAGNOSIS — T402X1A Poisoning by other opioids, accidental (unintentional), initial encounter: Secondary | ICD-10-CM | POA: Diagnosis not present

## 2016-03-12 DIAGNOSIS — T40601S Poisoning by unspecified narcotics, accidental (unintentional), sequela: Secondary | ICD-10-CM | POA: Diagnosis present

## 2016-03-12 DIAGNOSIS — T40601A Poisoning by unspecified narcotics, accidental (unintentional), initial encounter: Secondary | ICD-10-CM | POA: Diagnosis not present

## 2016-03-12 DIAGNOSIS — K219 Gastro-esophageal reflux disease without esophagitis: Secondary | ICD-10-CM | POA: Diagnosis present

## 2016-03-12 DIAGNOSIS — Z91040 Latex allergy status: Secondary | ICD-10-CM | POA: Diagnosis not present

## 2016-03-12 DIAGNOSIS — E876 Hypokalemia: Secondary | ICD-10-CM | POA: Diagnosis present

## 2016-03-12 DIAGNOSIS — F1121 Opioid dependence, in remission: Secondary | ICD-10-CM | POA: Diagnosis present

## 2016-03-12 DIAGNOSIS — Z86711 Personal history of pulmonary embolism: Secondary | ICD-10-CM | POA: Diagnosis not present

## 2016-03-12 DIAGNOSIS — M797 Fibromyalgia: Secondary | ICD-10-CM | POA: Diagnosis present

## 2016-03-12 DIAGNOSIS — R1084 Generalized abdominal pain: Secondary | ICD-10-CM

## 2016-03-12 DIAGNOSIS — I110 Hypertensive heart disease with heart failure: Secondary | ICD-10-CM | POA: Diagnosis present

## 2016-03-12 DIAGNOSIS — Z9049 Acquired absence of other specified parts of digestive tract: Secondary | ICD-10-CM | POA: Diagnosis not present

## 2016-03-12 DIAGNOSIS — Z86718 Personal history of other venous thrombosis and embolism: Secondary | ICD-10-CM | POA: Diagnosis not present

## 2016-03-12 DIAGNOSIS — F1321 Sedative, hypnotic or anxiolytic dependence, in remission: Secondary | ICD-10-CM | POA: Diagnosis present

## 2016-03-12 DIAGNOSIS — F418 Other specified anxiety disorders: Secondary | ICD-10-CM | POA: Diagnosis present

## 2016-03-12 DIAGNOSIS — Z91048 Other nonmedicinal substance allergy status: Secondary | ICD-10-CM | POA: Diagnosis not present

## 2016-03-12 DIAGNOSIS — G4700 Insomnia, unspecified: Secondary | ICD-10-CM | POA: Diagnosis present

## 2016-03-12 DIAGNOSIS — I69851 Hemiplegia and hemiparesis following other cerebrovascular disease affecting right dominant side: Secondary | ICD-10-CM | POA: Diagnosis not present

## 2016-03-12 DIAGNOSIS — Z7901 Long term (current) use of anticoagulants: Secondary | ICD-10-CM | POA: Diagnosis not present

## 2016-03-12 DIAGNOSIS — Z880 Allergy status to penicillin: Secondary | ICD-10-CM | POA: Diagnosis not present

## 2016-03-12 DIAGNOSIS — Y92019 Unspecified place in single-family (private) house as the place of occurrence of the external cause: Secondary | ICD-10-CM | POA: Diagnosis not present

## 2016-03-12 DIAGNOSIS — Z915 Personal history of self-harm: Secondary | ICD-10-CM | POA: Diagnosis not present

## 2016-03-12 DIAGNOSIS — Z881 Allergy status to other antibiotic agents status: Secondary | ICD-10-CM | POA: Diagnosis not present

## 2016-03-12 DIAGNOSIS — G894 Chronic pain syndrome: Secondary | ICD-10-CM | POA: Diagnosis present

## 2016-03-12 DIAGNOSIS — I5032 Chronic diastolic (congestive) heart failure: Secondary | ICD-10-CM | POA: Diagnosis present

## 2016-03-12 DIAGNOSIS — Z888 Allergy status to other drugs, medicaments and biological substances status: Secondary | ICD-10-CM | POA: Diagnosis not present

## 2016-03-12 LAB — BASIC METABOLIC PANEL
ANION GAP: 8 (ref 5–15)
BUN: 6 mg/dL (ref 6–20)
CO2: 22 mmol/L (ref 22–32)
Calcium: 8.5 mg/dL — ABNORMAL LOW (ref 8.9–10.3)
Chloride: 110 mmol/L (ref 101–111)
Creatinine, Ser: 0.62 mg/dL (ref 0.44–1.00)
GFR calc Af Amer: >60 mL/min (ref >60)
GFR calc non Af Amer: >60 mL/min (ref >60)
GLUCOSE: 84 mg/dL (ref 65–99)
Potassium: 3.4 mmol/L — ABNORMAL LOW (ref 3.5–5.1)
Sodium: 140 mmol/L (ref 135–145)

## 2016-03-12 LAB — CBC
HCT: 35.5 % (ref 35.0–47.0)
HEMOGLOBIN: 12.2 g/dL (ref 12.0–16.0)
MCH: 30.1 pg (ref 26.0–34.0)
MCHC: 34.3 g/dL (ref 32.0–36.0)
MCV: 87.8 fL (ref 80.0–100.0)
Platelets: 191 10*3/uL (ref 150–440)
RBC: 4.04 MIL/uL (ref 3.80–5.20)
RDW: 14.7 % — AB (ref 11.5–14.5)
WBC: 9.8 10*3/uL (ref 3.6–11.0)

## 2016-03-12 LAB — PHOSPHORUS: PHOSPHORUS: 4.3 mg/dL (ref 2.5–4.6)

## 2016-03-12 LAB — MAGNESIUM: MAGNESIUM: 2 mg/dL (ref 1.7–2.4)

## 2016-03-12 MED ORDER — OXYCODONE HCL 5 MG PO TABS
15.0000 mg | ORAL_TABLET | ORAL | Status: DC | PRN
  Administered 2016-03-12 – 2016-03-13 (×5): 15 mg via ORAL
  Filled 2016-03-12 (×5): qty 3

## 2016-03-12 MED ORDER — PROMETHAZINE HCL 25 MG PO TABS: 25.0000 mg | ORAL_TABLET | Freq: Four times a day (QID) | ORAL | Status: DC | PRN

## 2016-03-12 MED ORDER — PROPRANOLOL HCL 40 MG PO TABS
40.0000 mg | ORAL_TABLET | Freq: Three times a day (TID) | ORAL | Status: DC
  Administered 2016-03-12: 40 mg via ORAL
  Filled 2016-03-12 (×3): qty 1

## 2016-03-12 MED ORDER — ALBUTEROL SULFATE HFA 108 (90 BASE) MCG/ACT IN AERS: 2.0000 | INHALATION_SPRAY | RESPIRATORY_TRACT | Status: DC | PRN

## 2016-03-12 MED ORDER — KETOROLAC TROMETHAMINE 30 MG/ML IJ SOLN
30.0000 mg | Freq: Four times a day (QID) | INTRAMUSCULAR | Status: DC
  Administered 2016-03-12 (×2): 30 mg via INTRAVENOUS
  Filled 2016-03-12: qty 1

## 2016-03-12 MED ORDER — CYCLOBENZAPRINE HCL 10 MG PO TABS: 10.0000 mg | ORAL_TABLET | Freq: Three times a day (TID) | ORAL | Status: DC | PRN

## 2016-03-12 MED ORDER — SODIUM CHLORIDE 0.9 % IV SOLN
INTRAVENOUS | Status: DC
  Administered 2016-03-12: 75 mL/h via INTRAVENOUS

## 2016-03-12 MED ORDER — ZOLPIDEM TARTRATE 5 MG PO TABS
10.0000 mg | ORAL_TABLET | Freq: Every evening | ORAL | Status: DC | PRN
  Administered 2016-03-12: 10 mg via ORAL
  Filled 2016-03-12: qty 2

## 2016-03-12 MED ORDER — TIZANIDINE HCL 4 MG PO TABS
4.0000 mg | ORAL_TABLET | Freq: Four times a day (QID) | ORAL | Status: DC | PRN
  Administered 2016-03-12 (×2): 4 mg via ORAL
  Filled 2016-03-12 (×2): qty 1

## 2016-03-12 MED ORDER — ENOXAPARIN SODIUM 40 MG/0.4ML ~~LOC~~ SOLN
40.0000 mg | SUBCUTANEOUS | Status: DC
  Administered 2016-03-12: 40 mg via SUBCUTANEOUS
  Filled 2016-03-12: qty 0.4

## 2016-03-12 MED ORDER — KETOROLAC TROMETHAMINE 30 MG/ML IJ SOLN
INTRAMUSCULAR | Status: AC
  Administered 2016-03-12: 30 mg via INTRAVENOUS
  Filled 2016-03-12: qty 1

## 2016-03-12 MED ORDER — GABAPENTIN 600 MG PO TABS
1200.0000 mg | ORAL_TABLET | Freq: Three times a day (TID) | ORAL | Status: DC
  Administered 2016-03-12 – 2016-03-13 (×5): 1200 mg via ORAL
  Filled 2016-03-12 (×5): qty 2

## 2016-03-12 MED ORDER — LORAZEPAM 2 MG/ML IJ SOLN: 2.0000 mg | INTRAMUSCULAR | Status: DC | PRN

## 2016-03-12 MED ORDER — VITAMIN B-12 1000 MCG PO TABS
1000.0000 ug | ORAL_TABLET | Freq: Every day | ORAL | Status: DC
  Administered 2016-03-12 – 2016-03-13 (×2): 1000 ug via ORAL
  Filled 2016-03-12 (×2): qty 1

## 2016-03-12 MED ORDER — ALBUTEROL SULFATE (2.5 MG/3ML) 0.083% IN NEBU: 2.5000 mg | INHALATION_SOLUTION | Freq: Four times a day (QID) | RESPIRATORY_TRACT | Status: DC | PRN

## 2016-03-12 MED ORDER — ACETAMINOPHEN 325 MG PO TABS: 650.0000 mg | ORAL_TABLET | Freq: Four times a day (QID) | ORAL | Status: DC | PRN

## 2016-03-12 MED ORDER — POTASSIUM CHLORIDE CRYS ER 20 MEQ PO TBCR
40.0000 meq | EXTENDED_RELEASE_TABLET | Freq: Once | ORAL | Status: AC
  Administered 2016-03-12: 40 meq via ORAL
  Filled 2016-03-12: qty 2

## 2016-03-12 MED ORDER — SENNOSIDES-DOCUSATE SODIUM 8.6-50 MG PO TABS: 1.0000 | ORAL_TABLET | Freq: Every evening | ORAL | Status: DC | PRN

## 2016-03-12 MED ORDER — PROMETHAZINE HCL 25 MG PO TABS
12.5000 mg | ORAL_TABLET | Freq: Four times a day (QID) | ORAL | Status: DC | PRN
  Administered 2016-03-12 – 2016-03-13 (×3): 12.5 mg via ORAL
  Filled 2016-03-12 (×3): qty 1

## 2016-03-12 MED ORDER — ACETAMINOPHEN 650 MG RE SUPP: 650.0000 mg | Freq: Four times a day (QID) | RECTAL | Status: DC | PRN

## 2016-03-12 MED ORDER — ONDANSETRON HCL 4 MG PO TABS
8.0000 mg | ORAL_TABLET | Freq: Three times a day (TID) | ORAL | Status: DC | PRN
Start: 2016-03-12 — End: 2016-03-12

## 2016-03-12 MED ORDER — OXYCODONE HCL 10 MG PO TABS: 10.0000 mg | ORAL_TABLET | Freq: Four times a day (QID) | ORAL | Status: DC

## 2016-03-12 MED ORDER — MAGNESIUM CITRATE PO SOLN
1.0000 | Freq: Once | ORAL | Status: DC | PRN
  Filled 2016-03-12: qty 296

## 2016-03-12 MED ORDER — MORPHINE SULFATE (PF) 2 MG/ML IV SOLN: 1.0000 mg | INTRAVENOUS | Status: DC | PRN

## 2016-03-12 MED ORDER — BISACODYL 5 MG PO TBEC: 5.0000 mg | DELAYED_RELEASE_TABLET | Freq: Every day | ORAL | Status: DC | PRN

## 2016-03-12 MED ORDER — IPRATROPIUM BROMIDE 0.02 % IN SOLN: 0.5000 mg | Freq: Four times a day (QID) | RESPIRATORY_TRACT | Status: DC | PRN

## 2016-03-12 MED ORDER — TRAZODONE HCL 100 MG PO TABS
300.0000 mg | ORAL_TABLET | Freq: Every day | ORAL | Status: DC
  Administered 2016-03-12: 300 mg via ORAL
  Filled 2016-03-12: qty 3

## 2016-03-12 MED ORDER — PANTOPRAZOLE SODIUM 40 MG PO TBEC
40.0000 mg | DELAYED_RELEASE_TABLET | Freq: Every day | ORAL | Status: DC
  Administered 2016-03-12 – 2016-03-13 (×2): 40 mg via ORAL
  Filled 2016-03-12 (×2): qty 1

## 2016-03-12 MED ORDER — SODIUM CHLORIDE 0.9% FLUSH
3.0000 mL | Freq: Two times a day (BID) | INTRAVENOUS | Status: DC
  Administered 2016-03-12 – 2016-03-13 (×2): 3 mL via INTRAVENOUS

## 2016-03-12 MED ORDER — TRAZODONE HCL 100 MG PO TABS
100.0000 mg | ORAL_TABLET | Freq: Once | ORAL | Status: AC
  Administered 2016-03-12: 100 mg via ORAL
  Filled 2016-03-12: qty 1

## 2016-03-12 MED ORDER — SENNOSIDES-DOCUSATE SODIUM 8.6-50 MG PO TABS
1.0000 | ORAL_TABLET | Freq: Every day | ORAL | Status: DC
  Administered 2016-03-12: 1 via ORAL
  Filled 2016-03-12: qty 1

## 2016-03-12 MED ORDER — HYDROXYZINE HCL 25 MG PO TABS: 50.0000 mg | ORAL_TABLET | Freq: Two times a day (BID) | ORAL | Status: DC | PRN

## 2016-03-12 MED ORDER — OLANZAPINE 10 MG PO TABS
20.0000 mg | ORAL_TABLET | Freq: Three times a day (TID) | ORAL | Status: DC
  Administered 2016-03-12 – 2016-03-13 (×5): 20 mg via ORAL
  Filled 2016-03-12 (×6): qty 2

## 2016-03-12 MED ORDER — VITAMIN D 1000 UNITS PO TABS
2000.0000 [IU] | ORAL_TABLET | Freq: Every day | ORAL | Status: DC
  Administered 2016-03-12 – 2016-03-13 (×2): 2000 [IU] via ORAL
  Filled 2016-03-12 (×2): qty 2

## 2016-03-12 MED ORDER — PNEUMOCOCCAL VAC POLYVALENT 25 MCG/0.5ML IJ INJ: 0.5000 mL | INJECTION | INTRAMUSCULAR | Status: DC

## 2016-03-12 NOTE — ED Notes (Signed)
Pt in hallway bed eating meal tray and drinking PO fluids. Pt alert and oriented at this time. Pt ambulates well currently. PIV to foot intact and infusion running through at this time

## 2016-03-12 NOTE — ED Notes (Signed)
Report to Raquel, RN

## 2016-03-12 NOTE — Progress Notes (Signed)
[place] at [place] NAME: Leslie Marks    MR#:  [id number]  DATE OF BIRTH:  [birth year]  SUBJECTIVE:  CHIEF COMPLAINT:   Chief Complaint  Patient presents with  . Drug Overdose    pt came with overdose of her pain medicine- as she claims- her abdominal wound was hurting a lot, and she could not control. She was found lethargic and responded to narcain inj. She is better now- had severe pain after starting on narcane drip- but stopped and she is now alert and oriented. Asking for more pain meds now.  REVIEW OF SYSTEMS:  CONSTITUTIONAL: No fever, fatigue or weakness.  EYES: No blurred or double vision.  EARS, NOSE, AND THROAT: No tinnitus or ear pain.  RESPIRATORY: No cough, shortness of breath, wheezing or hemoptysis.  CARDIOVASCULAR: No chest pain, orthopnea, edema.  GASTROINTESTINAL: No nausea, vomiting, diarrhea or abdominal pain.  GENITOURINARY: No dysuria, hematuria.  ENDOCRINE: No polyuria, nocturia,  HEMATOLOGY: No anemia, easy bruising or bleeding SKIN: No rash or lesion. MUSCULOSKELETAL: No joint pain or arthritis.   NEUROLOGIC: No tingling, numbness, weakness.  PSYCHIATRY: No anxiety or depression.   ROS  DRUG ALLERGIES:   Allergies  Allergen Reactions  . Amoxicillin Anaphylaxis  . Augmentin [Amoxicillin-Pot Clavulanate] Anaphylaxis  . Betadine [Povidone Iodine] Anaphylaxis  . Ciprofloxacin Anaphylaxis  . Erythromycin Anaphylaxis  . Latex Anaphylaxis  . Penicillins Anaphylaxis    Has patient had a PCN reaction causing immediate rash, facial/tongue/throat swelling, SOB or lightheadedness with hypotension: yes Has patient had a PCN reaction causing severe rash involving mucus membranes or skin necrosis: no Has patient had a PCN reaction that required hospitalization no Has patient had a PCN reaction occurring within the last 10 years: no If all of the above answers are "NO", then may proceed with Cephalosporin use.    . Adhesive [Tape] Other (See Comments)    Skin "bubbles" and blisters  . Lyrica [Pregabalin] Swelling    VITALS:  Blood pressure (!) 108/51, pulse 66, temperature 98.2 F (36.8 C), temperature source Oral, resp. rate 16, height 5\' 5"  (1.651 m), weight 82.5 kg (181 lb 12.8 oz), SpO2 98 %.  PHYSICAL EXAMINATION:  GENERAL:  46 y.o.-year-old patient lying in the bed with no acute distress.  EYES: Pupils equal, round, reactive to light and accommodation. No scleral icterus. Extraocular muscles intact.  HEENT: Head atraumatic, normocephalic. Oropharynx and nasopharynx clear.  NECK:  Supple, no jugular venous distention. No thyroid enlargement, no tenderness.  LUNGS: Normal breath sounds bilaterally, no wheezing, rales,rhonchi or crepitation. No use of accessory muscles of respiration.  CARDIOVASCULAR: S1, S2 normal. No murmurs, rubs, or gallops.  ABDOMEN: Soft, mild tender, nondistended. Bowel sounds present. No organomegaly or mass. Surgical scar present on abdomen. EXTREMITIES: No pedal edema, cyanosis, or clubbing.  NEUROLOGIC: Cranial nerves II through XII are intact. Muscle strength 5/5 in all extremities. Sensation intact. Gait not checked. Deformity with atrophy present on right hand due to old stroke. PSYCHIATRIC: The patient is alert and oriented x 3.  SKIN: No obvious rash, lesion, or ulcer.   Physical Exam LABORATORY PANEL:   CBC  Recent Labs Lab 03/12/16 0615  WBC 9.8  HGB 12.2  HCT 35.5  PLT 191   ------------------------------------------------------------------------------------------------------------------  Chemistries   Recent Labs Lab 03/11/16 1602 03/12/16 0615  NA 134* 140  K 3.3* 3.4*  CL 105 110  CO2 24 22  GLUCOSE 95 84  BUN 9 6  CREATININE 1.11*  0.62  CALCIUM 8.5* 8.5*  MG  --  2.0  AST 23  --   ALT 16  --   ALKPHOS 139*  --   BILITOT 0.5  --     ------------------------------------------------------------------------------------------------------------------  Cardiac Enzymes No results for input(s): TROPONINI in the last 168 hours. ------------------------------------------------------------------------------------------------------------------  RADIOLOGY:  Ct Head Wo Contrast  Result Date: 03/11/2016 CLINICAL DATA:  Acute encephalopathy. EXAM: CT HEAD WITHOUT CONTRAST TECHNIQUE: Contiguous axial images were obtained from the base of the skull through the vertex without intravenous contrast. COMPARISON:  09/30/2014 FINDINGS: Brain: There is no evidence for acute hemorrhage, hydrocephalus, mass lesion, or abnormal extra-axial fluid collection. No definite CT evidence for acute infarction. Vascular: No hyperdense vessel or unexpected calcification. Skull: No evidence for fracture. No worrisome lytic or sclerotic lesion. Sinuses/Orbits: Visualized portions of the globes and intraorbital fat are unremarkable. The visualized paranasal sinuses and mastoid air cells are clear. Other: None. IMPRESSION: Normal CT brain Electronically Signed   By: Misty Stanley M.D.   On: 03/11/2016 21:15    ASSESSMENT AND PLAN:   Active Problems:   Opiate overdose   Generalized abdominal pain  This is a 46 y.o. female with a history of CHF, DVT, PE, stroke, opiate dependence, benzo dependence now being admitted with:  1. Unintentional opiate overdose - Given narcane drip- stopped now as pt is fully alert and oriented, and have pain due to opioid reversal. - Monitor for signs of opiate withdrawal. CIWA ordered. -Telemetry and continuous pulse ox -Pain management consultation for ongoing pain control- pain clinic does not see inpatient, pt goes to pain clinic in [place] confirmed her dose with pharmacy and we resume on the same dose here, psych consult. -Gen. surgery consult did for staple removal. -Repeat Tylenol level to monitor as  there was some mention of Percocet ingestion as well. Not high. - Maximize pain control with Toradol and Tylenol for now. Resume her home pain meds as confirmed with pharmacy.  2. Hypokalemia, mild -Replace by mouth  3. History of anxiety -Hold Klonopin for now, continue hydroxyzine as needed  4. History of CHF -Continue propranolol with hold parameters  5. History of insomnia - Hold Ambien and trazodone on admission, resume now.  6. History of CVA with right upper extremity contracture -Continue Zanaflex and Flexeril as needed  7. History of GERD -Continue Nexium    All the records are reviewed and case discussed with Care Management/Social Workerr. Management plans discussed with the patient, family and they are in agreement.  CODE STATUS: Full.  TOTAL TIME TAKING CARE OF THIS PATIENT: 35 minutes.     POSSIBLE D/C IN 1-2 DAYS, DEPENDING ON CLINICAL CONDITION.   Vaughan Basta M.D on 03/12/2016   Between 7am to 6pm - Pager - [phone]  After 6pm go to [email or web address] - password [email or web address] [facility]  Office  [phone]  CC: Primary care physician; No PCP Per Patient  Note: This dictation was prepared with Dragon dictation along with smaller phrase technology. Any transcriptional errors that result from this process are unintentional.

## 2016-03-12 NOTE — ED Notes (Signed)
Pt awaiting bed assignment. Pt aware of care plan.

## 2016-03-12 NOTE — ED Notes (Signed)
Prime doc coming to eval patient

## 2016-03-12 NOTE — Consult Note (Signed)
Patient ID: Leslie Marks, female   DOB: October 09, 1970, 46 y.o.   MRN: [id number]  CC: PAIN  HPI Leslie Marks is a 46 y.o. female currently admitted to the medicine service for an opioid overdose. Surgery was consult by Dr.Hugelmeyer to evaluate for possible staple removal. Patient reports that approximately 6 weeks ago she had a laparotomy performed at [facility] to remove her gallbladder and repair a chronic draining fistula. She was unable to keep her outpatient follow-up secondary to transportation and weather. Patient reports she continues to have abdominal pain. She took numerous oral opioids yesterday in preparation for staple removal which cause altered mental status and for her roommate to call EMS. She currently denies any fevers, chills, nausea, vomiting, chest pain, shortness breath, diarrhea, constipation. Her only complaint to me this morning is of abdominal pain. She states that her skin around her staples and started to grow over it and there is been no drainage or issues from the skin sites since discharge from [facility].  HPI  Past Medical History:  Diagnosis Date  . Asthma   . B12 deficiency   . Benzodiazepine dependence ([place])   . CHF (congestive heart failure) ([place])   . Chronic abdominal pain   . Chronic anticoagulation   . Chronic anxiety   . Chronic pain syndrome   . Collagen vascular disease ([place])   . Depression   . DVT (deep venous thrombosis) ([place])   . Encephalopathy   . Fibromyalgia   . GERD (gastroesophageal reflux disease)   . Hypoglycemia   . Iron deficiency anemia   . Lumbago   . Migraine   . Non-diabetic hypoglycemia   . Opiate dependence ([place])   . Overdose   . Pulmonary emboli ([place]) 2007  . Vitamin D deficiency     Past Surgical History:  Procedure Laterality Date  . ABDOMINAL HYSTERECTOMY    . APPENDECTOMY    . CESAREAN SECTION    . CHOLECYSTECTOMY    . GASTRIC BYPASS  2003  . TONSILLECTOMY      Family History  Problem Relation Age of Onset  .  Heart failure Neg Hx     Social History Social History  Substance Use Topics  . Smoking status: Never Smoker  . Smokeless tobacco: Never Used  . Alcohol use No    Allergies  Allergen Reactions  . Amoxicillin Anaphylaxis  . Augmentin [Amoxicillin-Pot Clavulanate] Anaphylaxis  . Betadine [Povidone Iodine] Anaphylaxis  . Ciprofloxacin Anaphylaxis  . Erythromycin Anaphylaxis  . Latex Anaphylaxis  . Penicillins Anaphylaxis    Has patient had a PCN reaction causing immediate rash, facial/tongue/throat swelling, SOB or lightheadedness with hypotension: yes Has patient had a PCN reaction causing severe rash involving mucus membranes or skin necrosis: no Has patient had a PCN reaction that required hospitalization no Has patient had a PCN reaction occurring within the last 10 years: no If all of the above answers are "NO", then may proceed with Cephalosporin use.   . Adhesive [Tape] Other (See Comments)    Skin "bubbles" and blisters  . Lyrica [Pregabalin] Swelling    Current Facility-Administered Medications  Medication Dose Route Frequency Provider Last Rate Last Dose  . 0.9 %  sodium chloride infusion   Intravenous Continuous Alexis Hugelmeyer, DO 75 mL/hr at 03/12/16 0610 75 mL/hr at 03/12/16 0610  . acetaminophen (TYLENOL) tablet 650 mg  650 mg Oral Q6H PRN Alexis Hugelmeyer, DO       Or  . acetaminophen (TYLENOL) suppository 650 mg  650 mg Rectal Q6H PRN Alexis Hugelmeyer, DO      . albuterol (PROVENTIL) (2.5 MG/3ML) 0.083% nebulizer solution 2.5 mg  2.5 mg Nebulization Q6H PRN Alexis Hugelmeyer, DO      . bisacodyl (DULCOLAX) EC tablet 5 mg  5 mg Oral Daily PRN Alexis Hugelmeyer, DO      . cholecalciferol (VITAMIN D) tablet 2,000 Units  2,000 Units Oral Daily Alexis Hugelmeyer, DO   2,000 Units at 03/12/16 0836  . cyclobenzaprine (FLEXERIL) tablet 10 mg  10 mg Oral TID PRN Alexis Hugelmeyer, DO      . enoxaparin (LOVENOX) injection 40 mg  40 mg Subcutaneous Q24H Alexis  Hugelmeyer, DO      . gabapentin (NEURONTIN) tablet 1,200 mg  1,200 mg Oral TID Alexis Hugelmeyer, DO   1,200 mg at 03/12/16 0834  . hydrOXYzine (ATARAX/VISTARIL) tablet 50 mg  50 mg Oral Q12H PRN Alexis Hugelmeyer, DO      . ipratropium (ATROVENT) nebulizer solution 0.5 mg  0.5 mg Nebulization Q6H PRN Alexis Hugelmeyer, DO      . ketorolac (TORADOL) 30 MG/ML injection 30 mg  30 mg Intravenous Q6H Alexis Hugelmeyer, DO   30 mg at 03/12/16 0445  . LORazepam (ATIVAN) injection 2-3 mg  2-3 mg Intravenous Q1H PRN Alexis Hugelmeyer, DO      . magnesium citrate solution 1 Bottle  1 Bottle Oral Once PRN Alexis Hugelmeyer, DO      . morphine 2 MG/ML injection 1 mg  1 mg Intravenous Q4H PRN Alexis Hugelmeyer, DO      . naloxone (NARCAN) 4 mg in dextrose 5 % 250 mL infusion  0.5 mg/hr Intravenous Continuous Merlyn Lot, MD   Stopped at 03/12/16 0400  . naloxone [facility]) injection 1 mg  1 mg Intravenous Once Merlyn Lot, MD      . OLANZapine [facility]) tablet 20 mg  20 mg Oral TID Alexis Hugelmeyer, DO   20 mg at 03/12/16 0840  . oxyCODONE (Oxy IR/ROXICODONE) immediate release tablet 15 mg  15 mg Oral Q4H PRN Alexis Hugelmeyer, DO   15 mg at 03/12/16 0610  . pantoprazole (PROTONIX) EC tablet 40 mg  40 mg Oral Daily Alexis Hugelmeyer, DO   40 mg at 03/12/16 0835  . [START ON 03/13/2016] pneumococcal 23 valent vaccine (PNU-IMMUNE) injection 0.5 mL  0.5 mL Intramuscular Tomorrow-1000 Pavan Pyreddy, MD      . promethazine (PHENERGAN) tablet 12.5 mg  12.5 mg Oral Q6H PRN Saundra Shelling, MD   12.5 mg at 03/12/16 [id number]  . propranolol (INDERAL) tablet 40 mg  40 mg Oral TID Alexis Hugelmeyer, DO   40 mg at 03/12/16 0836  . senna-docusate (Senokot-S) tablet 1 tablet  1 tablet Oral QHS PRN Alexis Hugelmeyer, DO      . sodium chloride flush (NS) 0.9 % injection 3 mL  3 mL Intravenous Q12H Alexis Hugelmeyer, DO      . tiZANidine (ZANAFLEX) tablet 4 mg  4 mg Oral Q6H PRN Alexis Hugelmeyer, DO   4 mg at 03/12/16 [id number]   . traZODone (DESYREL) tablet 300 mg  300 mg Oral QHS Vaughan Basta, MD      . vitamin B-12 (CYANOCOBALAMIN) tablet 1,000 mcg  1,000 mcg Oral Daily Alexis Hugelmeyer, DO   1,000 mcg at 03/12/16 0836  . zolpidem (AMBIEN) tablet 10 mg  10 mg Oral QHS PRN Vaughan Basta, MD         Review of Systems A Multi-point review of systems was asked and was  negative except for the findings documented in the history of present illness  Physical Exam Blood pressure 129/75, pulse (!) 101, temperature 98.6 F (37 C), temperature source Oral, resp. rate 16, height 5\' 5"  (1.651 m), weight 82.5 kg (181 lb 12.8 oz), SpO2 99 %. CONSTITUTIONAL: Resting in bed in no acute distress. EYES: Pupils are equal, round, and reactive to light, Sclera are non-icteric. EARS, NOSE, MOUTH AND THROAT: The oropharynx is clear. The oral mucosa is pink and moist. Hearing is intact to voice. LYMPH NODES:  Lymph nodes in the neck are normal. RESPIRATORY:  Lungs are clear. CARDIOVASCULAR: Heart is regular without murmurs, gallops, or rubs. GI: The abdomen is soft, tender to the midline to any palpation, and nondistended. There are no palpable masses. There is no hepatosplenomegaly. There are normal bowel sounds in all quadrants. Surgical staples are in place the midline over a well healed incision site with some epithelialization over the staples themselves. GU: Rectal deferred.   MUSCULOSKELETAL: Normal muscle strength and tone. No cyanosis or edema.   SKIN: Turgor is good and there are no pathologic skin lesions or ulcers. NEUROLOGIC: Motor and sensation is grossly normal. Cranial nerves are grossly intact. PSYCH:  Oriented to person, place and time. Affect is normal.  Data Reviewed Labs reviewed and were within normal limits except for a mild hypokalemia of 3.4 and a mild hypocalcemia of 8.5. Normal white blood cell count 9.8. There is no abdominal imaging for this encounter. I have personally reviewed the  patient's imaging, laboratory findings and medical records.    Assessment    Abdominal pain, residual surgical staples.    Plan    46 year old female with chronic abdominal pain admitted currently for an opioid overdose. Responded appropriately to narcotic reversal. Staples remain in place from surgery 12 weeks ago. These were removed at the bedside without difficulty. There is no evidence of infection, wound breakdown, drainage from any site. Discussed at length with the patient the need for her to follow-up with her operative surgeons and she states she has a follow-up appointment artery made at [facility]. Thank you for allowing [place] per to this patient's care. Please call the surgery call phone again should we have any further assistance.     Time spent with the patient was 55 minutes, with more than 50% of the time spent in face-to-face education, counseling and care coordination.     Clayburn Pert, MD FACS General Surgeon 03/12/2016, 10:59 AM

## 2016-03-12 NOTE — H&P (Signed)
History and Physical   [facility] - [place] @ [facility] Admission History and Physical [employer], D.O.    Patient Name: Leslie Marks MR#: [id number] Date of Birth: [birth year] Date of Admission: 03/11/2016  Referring MD/NP/PA: Dr. Quentin Cornwall Primary Care Physician: No PCP Per Patient Outpatient Specialists: Pain management  Patient coming from: Home  Chief Complaint: Drug overdose  HPI: Leslie Marks is a 46 y.o. female with a known history of CHF, DVT, PE, stroke, opiate dependence, benzo dependence was in a usual state of health until about one month ago she had a cholecystectomy with a bowel resection to repair a long-standing and nonhealing fistula at [facility] on 01/30/2016. She had staples placed but due to whether she was unable to have them removed. She presented to wound care to have the staples removed and they were unable to do so. She reports an acute worsening of her pain for which she doubled her oxycodone and her Nucynta ER dosing taking for 10 mg tabs of oxycodone and 4 100 mg tabs of Nucynta. Her roommate found her with decreased respirations, altered mental status. Patient states that she had no intention of harming herself she was just in excruciating pain secondary to the manipulation of the staples. EMS provided 2 mg of intranasal Narcan with resolution of her symptoms. In the emergency department patient was found to have a recurrence of symptoms including altered mental status and hypotension. She received another 2 doses of Narcan intravenously and then was started on a Narcan drip. Poison control was called, her pain medications were verified.   Here in the emergency department patient complains of severe 7 out of 10 abdominal pain at the site of the staples.  Otherwise there has been no change in status. Patient has otherwise been taking medication as prescribed and there has been no recent change in medication or diet.  No recent antibiotics.  There has been  no recent illness, hospitalizations, travel or sick contacts.    Patient denies fevers/chills, weakness, dizziness, chest pain, shortness of breath, N/V/C/D, abdominal pain, dysuria/frequency, suicidal ideation/homicidal ideation.   Review of Systems:  CONSTITUTIONAL: No fever/chills, fatigue, weakness, weight gain/loss, headache. EYES: No blurry or double vision. ENT: No tinnitus, postnasal drip, redness or soreness of the oropharynx. RESPIRATORY: No cough, dyspnea, wheeze.  No hemoptysis.  CARDIOVASCULAR: No chest pain, palpitations, syncope, orthopnea. No lower extremity edema.  GASTROINTESTINAL: No nausea, vomiting, diarrhea, constipation.  No hematemesis, melena or hematochezia. Positive abdominal pain GENITOURINARY: No dysuria, frequency, hematuria. ENDOCRINE: No polyuria or nocturia. No heat or cold intolerance. HEMATOLOGY: No anemia, bruising, bleeding. INTEGUMENTARY: No rashes, ulcers, lesions. MUSCULOSKELETAL: No arthritis, gout, dyspnea. NEUROLOGIC: No numbness, tingling, ataxia, seizure-type activity, weakness. PSYCHIATRIC: No anxiety, depression, insomnia.   Past Medical History:  Diagnosis Date  . Asthma   . B12 deficiency   . Benzodiazepine dependence ([place])   . CHF (congestive heart failure) ([place])   . Chronic abdominal pain   . Chronic anticoagulation   . Chronic anxiety   . Chronic pain syndrome   . Collagen vascular disease ([place])   . Depression   . DVT (deep venous thrombosis) ([place])   . Encephalopathy   . Fibromyalgia   . GERD (gastroesophageal reflux disease)   . Hypoglycemia   . Iron deficiency anemia   . Lumbago   . Migraine   . Non-diabetic hypoglycemia   . Opiate dependence ([place])   . Overdose   . Pulmonary emboli ([place]) 2007  . Vitamin D deficiency  Past Surgical History:  Procedure Laterality Date  . ABDOMINAL HYSTERECTOMY    . APPENDECTOMY    . CESAREAN SECTION    . CHOLECYSTECTOMY    . GASTRIC BYPASS  2003  . TONSILLECTOMY        reports that she has never smoked. She has never used smokeless tobacco. She reports that she does not drink alcohol or use drugs.  Allergies  Allergen Reactions  . Amoxicillin Anaphylaxis  . Augmentin [Amoxicillin-Pot Clavulanate] Anaphylaxis  . Betadine [Povidone Iodine] Anaphylaxis  . Ciprofloxacin Anaphylaxis  . Erythromycin Anaphylaxis  . Latex Anaphylaxis  . Penicillins Anaphylaxis    Has patient had a PCN reaction causing immediate rash, facial/tongue/throat swelling, SOB or lightheadedness with hypotension: yes Has patient had a PCN reaction causing severe rash involving mucus membranes or skin necrosis: no Has patient had a PCN reaction that required hospitalization no Has patient had a PCN reaction occurring within the last 10 years: no If all of the above answers are "NO", then may proceed with Cephalosporin use.   . Adhesive [Tape]   . Lyrica [Pregabalin]     Family History  Problem Relation Age of Onset  . Heart failure Neg Hx    Family history has been reviewed and confirmed with patient.   Prior to Admission medications   Medication Sig Start Date End Date Taking? Authorizing Provider  cholecalciferol (VITAMIN D) 1000 UNITS tablet Take 2,000 Units by mouth daily.   Yes Historical Provider, MD  clonazePAM (KLONOPIN) 1 MG tablet Take 1 mg by mouth 2 (two) times daily.   Yes Historical Provider, MD  cyclobenzaprine (FLEXERIL) 10 MG tablet Take 10 mg by mouth 3 (three) times daily as needed.    Yes Historical Provider, MD  esomeprazole (NEXIUM) 40 MG capsule Take 40 mg by mouth 2 (two) times daily.   Yes Historical Provider, MD  fentaNYL (DURAGESIC - DOSED MCG/HR) 25 MCG/HR patch Place 25 mcg onto the skin every 3 (three) days.   Yes Historical Provider, MD  gabapentin (NEURONTIN) 600 MG tablet Take 1,200 mg by mouth 3 (three) times daily.    Yes Historical Provider, MD  hydrOXYzine (ATARAX/VISTARIL) 50 MG tablet Take 50 mg by mouth every 12 (twelve) hours as needed.    Yes Historical Provider, MD  OLANZapine (ZYPREXA) 10 MG tablet Take 20 mg by mouth 3 (three) times daily.   Yes Historical Provider, MD  ondansetron (ZOFRAN) 8 MG tablet Take 8 mg by mouth every 8 (eight) hours as needed for nausea or vomiting.   Yes Historical Provider, MD  oxyCODONE (OXY IR/ROXICODONE) 5 MG immediate release tablet Take 10-20 mg by mouth every 6 (six) hours as needed for severe pain.   Yes Historical Provider, MD  propranolol (INDERAL) 40 MG tablet Take 40 mg by mouth 3 (three) times daily.   Yes Historical Provider, MD  Tapentadol HCl 100 MG TABS Take 200 mg by mouth 2 (two) times daily.    Yes Historical Provider, MD  tiZANidine (ZANAFLEX) 4 MG tablet Take 4 mg by mouth every 6 (six) hours as needed for muscle spasms.   Yes Historical Provider, MD  traZODone (DESYREL) 150 MG tablet Take 300 mg by mouth at bedtime.   Yes Historical Provider, MD  vitamin B-12 (CYANOCOBALAMIN) 1000 MCG tablet Take 1,000 mcg by mouth daily.   Yes Historical Provider, MD  zolpidem (AMBIEN) 10 MG tablet Take 10 mg by mouth at bedtime.   Yes Historical Provider, MD    Physical Exam: Vitals:  03/11/16 1541 03/11/16 1544 03/11/16 2047  BP:  (!) 80/53 (!) 151/80  Pulse:  76 (!) 102  Resp:  16 15  Temp:  97.8 F (36.6 C)   TempSrc:  Oral   SpO2:  96% 98%  Weight: 77.6 kg (171 lb)    Height: 5\' 5"  (1.651 m)      GENERAL: 46 y.o.-year-old White female patient, well-developed, well-nourished lying in the bed in no acute distress.  Pleasant and cooperative.  Somewhat anxious HEENT: Head atraumatic, normocephalic. Pupils equal, round, reactive to light and accommodation. No scleral icterus. Extraocular muscles intact. Nares are patent. Oropharynx is clear. Mucus membranes moist. NECK: Supple, full range of motion. No JVD, no bruit heard. No thyroid enlargement, no tenderness, no cervical lymphadenopathy. CHEST: Normal breath sounds bilaterally. No wheezing, rales, rhonchi or crackles. No use of  accessory muscles of respiration.  No reproducible chest wall tenderness.  CARDIOVASCULAR: S1, S2 normal. No murmurs, rubs, or gallops. Cap refill <2 seconds. Pulses intact distally.  ABDOMEN: Soft, nondistended, moderate tenderness along the midline incision which is clean, dry, intact and with staples in place. No drainage or erythema. No rebound, guarding, rigidity. Normoactive bowel sounds present in all four quadrants. No organomegaly or mass. EXTREMITIES: No pedal edema, cyanosis, or clubbing. No calf tenderness or Homan's sign. She does have a contracture of her right hand and wrist secondary to prior stroke NEUROLOGIC: The patient is alert and oriented x 3. Cranial nerves II through XII are grossly intact with no focal sensorimotor deficit. Muscle strength 5/5 in all extremities. Sensation intact. Gait not checked. PSYCHIATRIC:  Normal affect, mood, thought content. SKIN: Warm, dry, and intact without obvious rash, lesion, or ulcer.    Labs on Admission:  CBC:  Recent Labs Lab 03/11/16 1602  WBC 10.3  HGB 11.6*  HCT 34.6*  MCV 88.9  PLT [id number]   Basic Metabolic Panel:  Recent Labs Lab 03/11/16 1602  NA 134*  K 3.3*  CL 105  CO2 24  GLUCOSE 95  BUN 9  CREATININE 1.11*  CALCIUM 8.5*   GFR: Estimated Creatinine Clearance: 67.3 mL/min (by C-G formula based on SCr of 1.11 mg/dL (H)). Liver Function Tests:  Recent Labs Lab 03/11/16 1602  AST 23  ALT 16  ALKPHOS 139*  BILITOT 0.5  PROT 6.5  ALBUMIN 3.4*   No results for input(s): LIPASE, AMYLASE in the last 168 hours. No results for input(s): AMMONIA in the last 168 hours. Coagulation Profile: No results for input(s): INR, PROTIME in the last 168 hours. Cardiac Enzymes: No results for input(s): CKTOTAL, CKMB, CKMBINDEX, TROPONINI in the last 168 hours. BNP (last 3 results) No results for input(s): PROBNP in the last 8760 hours. HbA1C: No results for input(s): HGBA1C in the last 72 hours. CBG: No results for  input(s): GLUCAP in the last 168 hours. Lipid Profile: No results for input(s): CHOL, HDL, LDLCALC, TRIG, CHOLHDL, LDLDIRECT in the last 72 hours. Thyroid Function Tests: No results for input(s): TSH, T4TOTAL, FREET4, T3FREE, THYROIDAB in the last 72 hours. Anemia Panel: No results for input(s): VITAMINB12, FOLATE, FERRITIN, TIBC, IRON, RETICCTPCT in the last 72 hours. Urine analysis:    Component Value Date/Time   COLORURINE YELLOW 05/27/2013 0024   APPEARANCEUR CLEAR 05/27/2013 0024   LABSPEC 1.010 05/27/2013 0024   PHURINE 5.5 05/27/2013 0024   GLUCOSEU 250 (A) 05/27/2013 0024   HGBUR NEGATIVE 05/27/2013 0024   BILIRUBINUR NEGATIVE 05/27/2013 0024   KETONESUR NEGATIVE 05/27/2013 0024   PROTEINUR NEGATIVE  05/27/2013 0024   UROBILINOGEN 0.2 05/27/2013 0024   NITRITE NEGATIVE 05/27/2013 0024   LEUKOCYTESUR NEGATIVE 05/27/2013 0024   Sepsis Labs: @LABRCNTIP (procalcitonin:4,lacticidven:4) )No results found for this or any previous visit (from the past 240 hour(s)).   Radiological Exams on Admission: Ct Head Wo Contrast  Result Date: 03/11/2016 CLINICAL DATA:  Acute encephalopathy. EXAM: CT HEAD WITHOUT CONTRAST TECHNIQUE: Contiguous axial images were obtained from the base of the skull through the vertex without intravenous contrast. COMPARISON:  09/30/2014 FINDINGS: Brain: There is no evidence for acute hemorrhage, hydrocephalus, mass lesion, or abnormal extra-axial fluid collection. No definite CT evidence for acute infarction. Vascular: No hyperdense vessel or unexpected calcification. Skull: No evidence for fracture. No worrisome lytic or sclerotic lesion. Sinuses/Orbits: Visualized portions of the globes and intraorbital fat are unremarkable. The visualized paranasal sinuses and mastoid air cells are clear. Other: None. IMPRESSION: Normal CT brain Electronically Signed   By: Misty Stanley M.D.   On: 03/11/2016 21:15    EKG: Sinus tach at 103 bpm  Assessment/Plan Active  Problems:   Opiate overdose    This is a 45 y.o. female with a history of CHF, DVT, PE, stroke, opiate dependence, benzo dependence now being admitted with:  1. Unintentional opiate overdose -Admit to stepdown for continued Narcan drip given events in the emergency department -Monitor for signs of opiate withdrawal. CIWA ordered. -Telemetry and continuous pulse ox -Pain management consultation for ongoing pain control -Gen. surgery consult did for staple removal. -Repeat Tylenol level to monitor as there was some mention of Percocet ingestion as well. - Maximize pain control with Toradol and Tylenol for now.   2. Hypokalemia, mild -Replace by mouth  3. History of anxiety -Hold Klonopin for now, continue hydroxyzine as needed  4. History of CHF -Continue propranolol with hold parameters  5. History of insomnia - Hold Ambien and trazodone for now  6. History of CVA with right upper extremity contracture -Continue Zanaflex and Flexeril as needed  7. History of GERD -Continue Nexium   Admission status: Inpatient, stepdown telemetry IV Fluids: Normal saline Diet/Nutrition: Heart healthy Consults called: Pain management, general surgery  DVT Px: Lovenox, SCDs and early ambulation. Code Status: Full Code  Disposition Plan: To home in 1-2 days   All the records are reviewed and case discussed with ED provider. Management plans discussed with the patient and/or family who express understanding and agree with plan of care.  Hanaan Gancarz D.O. on 03/12/2016 at 12:25 AM Between 7am to 6pm - Pager - [phone] After 6pm go to [email or web address] - Marketing executive [place] [facility] Office [phone] CC: Primary care physician; No PCP Per Patient   03/12/2016, 12:25 AM

## 2016-03-12 NOTE — Evaluation (Signed)
Physical Therapy Evaluation Patient Details Name: Leslie Marks MRN: [id number] DOB: May 08, 1970 Today's Date: 03/12/2016   History of Present Illness  46 y.o. female with a known history of CHF, DVT, PE, stroke, opiate dependence, benzo dependence was in a usual state of health until about one month ago she had a cholecystectomy with a bowel resection to repair a long-standing and nonhealing fistula at [facility] on 01/30/2016. She had an accidental overdose of pain meds and had altered breathing and mental status.  Clinical Impression  Pt is able to do all mobility and all ambulation with good confidence and generally had no safety or other issues.  She has baseline R UE limitations but otherwise has no limitations and ultimately feels confident going home and will not require any further PT intervention.    Follow Up Recommendations No PT follow up    Equipment Recommendations       Recommendations for Other Services       Precautions / Restrictions Precautions Precautions: None Restrictions Weight Bearing Restrictions: No      Mobility  Bed Mobility Overal bed mobility: Independent                Transfers Overall transfer level: Independent Equipment used: None             General transfer comment: Pt did well with mobility, no issues getting to standing  Ambulation/Gait Ambulation/Gait assistance: Independent Ambulation Distance (Feet): 200 Feet Assistive device: None       General Gait Details: Pt walks with good speed, safety and confidence and had no issues that would reqwuire further PT intervention.   Stairs            Wheelchair Mobility    Modified Rankin (Stroke Patients Only)       Balance Overall balance assessment: Independent                                           Pertinent Vitals/Pain Pain Assessment: 0-10 Pain Score: 9  Pain Location: at site of staples    Home Living Family/patient expects to be  discharged to:: Private residence Living Arrangements: Non-relatives/Friends Available Help at Discharge: Friend(s)   Home Access: Level entry              Prior Function Level of Independence: Independent         Comments: Pt reports she can do all she needs w/o issue     Hand Dominance        Extremity/Trunk Assessment   Upper Extremity Assessment Upper Extremity Assessment: RUE deficits/detail (R UE contraction/no functional AROM)    Lower Extremity Assessment Lower Extremity Assessment: Overall WFL for tasks assessed       Communication   Communication: No difficulties  Cognition Arousal/Alertness: Awake/alert Behavior During Therapy: WFL for tasks assessed/performed Overall Cognitive Status: Within Functional Limits for tasks assessed                      General Comments      Exercises     Assessment/Plan    PT Assessment Patent does not need any further PT services  PT Problem List            PT Treatment Interventions      PT Goals (Current goals can be found in the Care Plan section)  Acute Rehab PT Goals  Patient Stated Goal: go home    Frequency     Barriers to discharge        Co-evaluation               End of Session Equipment Utilized During Treatment: Gait belt Activity Tolerance: Patient tolerated treatment well Patient left: in bed;with call bell/phone within reach           Time: [id number] PT Time Calculation (min) (ACUTE ONLY): 10 min   Charges:   PT Evaluation $PT Eval Low Complexity: 1 Procedure     PT G CodesKreg Shropshire, DPT 03/12/2016, 11:34 AM

## 2016-03-12 NOTE — ED Notes (Signed)
Report called to receiving RN . Patient and family informed of room assignment, and plan for transfer. Patient in stable condition. Preparing for transfer

## 2016-03-12 NOTE — Progress Notes (Signed)
Pt came from ED per wheelchair, alert and oriented, not in any respiratory distress, with G22 on the left foot, intact, with surgical incision on the mid-abdomen with staples, complaining of 9/10 abdominal pain on the surgical site. Pt asking for pain and sleeping pills this time, pt educated but still insisted on getting pain and sleeping medicines. Dr Estanislado Pandy paged and ordered Trazodone 100mg  PO one time dose.

## 2016-03-12 NOTE — Progress Notes (Signed)
Leslie Marks responded to an OR for Advance Directives. CH met with Pt who was lying in the bed feeling sleepy at the time. CH explained what the AD is to the Pt, but the Pt was not interested in completing the AD. [place] thanked the Pt and left.    03/12/16 1300  Clinical Encounter Type  Visited With Patient  Visit Type Initial;Other (Comment)  Referral From Nurse  Consult/Referral To Chaplain  Spiritual Encounters  Spiritual Needs Brochure;Prayer  Stress Factors  Patient Stress Factors Exhausted  Family Stress Factors None identified

## 2016-03-12 NOTE — ED Notes (Signed)
Pt disconnected narcan drip and states is refusing it, states "I want my pain medicine".

## 2016-03-12 NOTE — Consult Note (Signed)
[place] [facility] Consult   Reason for Consult:  Consult for 46 year old woman who presented to the hospital with an overdose of narcotics Referring Physician:  Molli Hazard Patient Identification: Leslie Marks MRN:  [id number] Principal Diagnosis: Opiate overdose Diagnosis:   Patient Active Problem List   Diagnosis Date Noted  . Opiate overdose [T40.601A] 03/12/2016  . Generalized abdominal pain [R10.84]   . Benzodiazepine overdose [T42.4X1A] 09/30/2014  . Encephalopathy [G93.40] 09/30/2014  . H/O gastric bypass [Z98.890] 05/27/2013  . 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 Malabsorption syndrome [K90.9] 05/27/2013  . Cholelithiasis [K80.20] 05/12/2013  . Lupus anticoagulant positive [R76.0] 04/29/2013  . 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 Altered mental status [R41.82] 04/27/2013  . Pulmonary emboli ([place]) [I26.99] 04/27/2013  . Chronic pain syndrome [G89.4] 04/27/2013  . Chronic anxiety [F41.9] 04/27/2013  . Non-diabetic hypoglycemia [E16.2] 04/27/2013  . Acute encephalopathy [G93.40] 04/26/2013  . Depression [F32.9] 04/26/2013  . Chronic anticoagulation [Z79.01] 04/26/2013  . Pancytopenia [facility]) [F64.332] 04/26/2013    Total Time spent with patient: 1 hour  Subjective:   Leslie Marks is a 46 y.o. female patient admitted with "I took too much medicine".  HPI:  Patient interviewed. Chart reviewed. Old notes reviewed. I also looked at her prescriptions on the controlled substance database. 46 year old woman presented to the hospital with altered mental status related to excessive narcotic use. Patient recently had gallbladder surgery and was feeling pain in the incision area and in her abdomen. She said that she thought it was overwhelming and so she went home and took 4 of her Nucynta pills and several oxycodone's. Patient evidently had an altered mental status and someone had her brought to the hospital. Patient says she doesn't remember  any of it. She absolutely denies any depression. Denies any wish to die. Denies there was any intention to harm herself. Patient says her mood generally is okay. She has chronic poor sleep despite taking a large amount of sedating medicine at night. She denies that she's been abusing any other medicine recently.  Social history: Patient states that she lives with 2 roommates. She does not work. She is disabled. Feels like her home is a safe situation.  Medical history: Patient evidently has a diagnosis of fibromyalgia which is the rationale for her chronic extraordinarily high doses of narcotics.  Substance abuse history: Patient denies alcohol abuse. Denies other drug abuse. Patient does not see that her use of narcotics and sedatives constitutes abuse despite multiple hospitalizations she has had with overdoses.  Past Psychiatric History: Patient has a history of a diagnosis of depression. She has had one prior suicide attempt but says it was many years ago and has had one prior hospitalization also many years ago. She is treated with Zyprexa as a chronic medication which apparently is helpful for her. Patient has been seen by psychiatry consult on other admissions for overdose and a similar presentation has been found. She claims that she is taking clonazepam currently. Claims that it is prescribed by her pain doctors. I could find no evidence at all in the controlled substance database that she is prescribed this medicine.  Risk to Self: Is patient at risk for suicide?: Yes Risk to Others:   Prior Inpatient Therapy:   Prior Outpatient Therapy:    Past Medical History:  Past Medical History:  Diagnosis Date  . Asthma   . B12 deficiency   . Benzodiazepine dependence ([place])   . CHF (congestive heart failure) ([place])   .  Chronic abdominal pain   . Chronic anticoagulation   . Chronic anxiety   . Chronic pain syndrome   . Collagen vascular disease ([place])   . Depression   . DVT (deep venous  thrombosis) ([place])   . Encephalopathy   . Fibromyalgia   . GERD (gastroesophageal reflux disease)   . Hypoglycemia   . Iron deficiency anemia   . Lumbago   . Migraine   . Non-diabetic hypoglycemia   . Opiate dependence ([place])   . Overdose   . Pulmonary emboli ([place]) 2007  . Vitamin D deficiency     Past Surgical History:  Procedure Laterality Date  . ABDOMINAL HYSTERECTOMY    . APPENDECTOMY    . CESAREAN SECTION    . CHOLECYSTECTOMY    . GASTRIC BYPASS  2003  . TONSILLECTOMY     Family History:  Family History  Problem Relation Age of Onset  . Heart failure Neg Hx    Family Psychiatric  History: She says everyone in her family is depressed Social History:  History  Alcohol Use No     History  Drug Use No    Social History   Social History  . Marital status: Single    Spouse name: N/A  . Number of children: N/A  . Years of education: N/A   Social History Main Topics  . Smoking status: Never Smoker  . Smokeless tobacco: Never Used  . Alcohol use No  . Drug use: No  . Sexual activity: Not Asked   Other Topics Concern  . None   Social History Narrative  . None   Additional Social History:    Allergies:   Allergies  Allergen Reactions  . Amoxicillin Anaphylaxis  . Augmentin [Amoxicillin-Pot Clavulanate] Anaphylaxis  . Betadine [Povidone Iodine] Anaphylaxis  . Ciprofloxacin Anaphylaxis  . Erythromycin Anaphylaxis  . Latex Anaphylaxis  . Penicillins Anaphylaxis    Has patient had a PCN reaction causing immediate rash, facial/tongue/throat swelling, SOB or lightheadedness with hypotension: yes Has patient had a PCN reaction causing severe rash involving mucus membranes or skin necrosis: no Has patient had a PCN reaction that required hospitalization no Has patient had a PCN reaction occurring within the last 10 years: no If all of the above answers are "NO", then may proceed with Cephalosporin use.   . Adhesive [Tape] Other (See Comments)    Skin  "bubbles" and blisters  . Lyrica [Pregabalin] Swelling    Labs:  Results for orders placed or performed during the hospital encounter of 03/11/16 (from the past 48 hour(s))  Comprehensive metabolic panel     Status: Abnormal   Collection Time: 03/11/16  4:02 PM  Result Value Ref Range   Sodium 134 (L) 135 - 145 mmol/L   Potassium 3.3 (L) 3.5 - 5.1 mmol/L   Chloride 105 101 - 111 mmol/L   CO2 24 22 - 32 mmol/L   Glucose, Bld 95 65 - 99 mg/dL   BUN 9 6 - 20 mg/dL   Creatinine, Ser 1.11 (H) 0.44 - 1.00 mg/dL   Calcium 8.5 (L) 8.9 - 10.3 mg/dL   Total Protein 6.5 6.5 - 8.1 g/dL   Albumin 3.4 (L) 3.5 - 5.0 g/dL   AST 23 15 - 41 U/L   ALT 16 14 - 54 U/L   Alkaline Phosphatase 139 (H) 38 - 126 U/L   Total Bilirubin 0.5 0.3 - 1.2 mg/dL   GFR calc non Af Amer 60 (L) >60 mL/min   GFR calc  Af Amer >60 >60 mL/min    Comment: (NOTE) The eGFR has been calculated using the CKD EPI equation. This calculation has not been validated in all clinical situations. eGFR's persistently <60 mL/min signify possible Chronic Kidney Disease.    Anion gap 5 5 - 15  Ethanol     Status: None   Collection Time: 03/11/16  4:02 PM  Result Value Ref Range   Alcohol, Ethyl (B) <5 <5 mg/dL    Comment:        LOWEST DETECTABLE LIMIT FOR SERUM ALCOHOL IS 5 mg/dL FOR MEDICAL PURPOSES ONLY   Salicylate level     Status: None   Collection Time: 03/11/16  4:02 PM  Result Value Ref Range   Salicylate Lvl <1.0 2.8 - 30.0 mg/dL  Acetaminophen level     Status: Abnormal   Collection Time: 03/11/16  4:02 PM  Result Value Ref Range   Acetaminophen (Tylenol), Serum <10 (L) 10 - 30 ug/mL    Comment:        THERAPEUTIC CONCENTRATIONS VARY SIGNIFICANTLY. A RANGE OF 10-30 ug/mL MAY BE AN EFFECTIVE CONCENTRATION FOR MANY PATIENTS. HOWEVER, SOME ARE BEST TREATED AT CONCENTRATIONS OUTSIDE THIS RANGE. ACETAMINOPHEN CONCENTRATIONS >150 ug/mL AT 4 HOURS AFTER INGESTION AND >50 ug/mL AT 12 HOURS AFTER INGESTION  ARE OFTEN ASSOCIATED WITH TOXIC REACTIONS.   cbc     Status: Abnormal   Collection Time: 03/11/16  4:02 PM  Result Value Ref Range   WBC 10.3 3.6 - 11.0 K/uL   RBC 3.89 3.80 - 5.20 MIL/uL   Hemoglobin 11.6 (L) 12.0 - 16.0 g/dL   HCT 34.6 (L) 35.0 - 47.0 %   MCV 88.9 80.0 - 100.0 fL   MCH 29.7 26.0 - 34.0 pg   MCHC 33.4 32.0 - 36.0 g/dL   RDW 14.2 11.5 - 14.5 %   Platelets 294 150 - 440 K/uL  Urine Drug Screen, Qualitative     Status: Abnormal   Collection Time: 03/11/16  4:18 PM  Result Value Ref Range   Tricyclic, Ur Screen NONE DETECTED NONE DETECTED   Amphetamines, Ur Screen NONE DETECTED NONE DETECTED   MDMA (Ecstasy)Ur Screen NONE DETECTED NONE DETECTED   Cocaine Metabolite,Ur [place] NONE DETECTED NONE DETECTED   Opiate, Ur Screen POSITIVE (A) NONE DETECTED   Phencyclidine (PCP) Ur S NONE DETECTED NONE DETECTED   Cannabinoid 50 Ng, Ur [place] NONE DETECTED NONE DETECTED   Barbiturates, Ur Screen NONE DETECTED NONE DETECTED   Benzodiazepine, Ur Scrn NONE DETECTED NONE DETECTED   Methadone Scn, Ur NONE DETECTED NONE DETECTED    Comment: (NOTE) 932  Tricyclics, urine               Cutoff 1000 ng/mL 200  Amphetamines, urine             Cutoff 1000 ng/mL 300  MDMA (Ecstasy), urine           Cutoff 500 ng/mL 400  Cocaine Metabolite, urine       Cutoff 300 ng/mL 500  Opiate, urine                   Cutoff 300 ng/mL 600  Phencyclidine (PCP), urine      Cutoff 25 ng/mL 700  Cannabinoid, urine              Cutoff 50 ng/mL 800  Barbiturates, urine             Cutoff 200 ng/mL 900  Benzodiazepine, urine  Cutoff 200 ng/mL 1000 Methadone, urine                Cutoff 300 ng/mL 1100 1200 The urine drug screen provides only a preliminary, unconfirmed 1300 analytical test result and should not be used for non-medical 1400 purposes. Clinical consideration and professional judgment should 1500 be applied to any positive drug screen result due to possible 1600 interfering substances. A  more specific alternate chemical method 1700 must be used in order to obtain a confirmed analytical result.  1800 Gas chromato graphy / mass spectrometry (GC/MS) is the preferred 1900 confirmatory method.   Acetaminophen level     Status: Abnormal   Collection Time: 03/11/16  7:30 PM  Result Value Ref Range   Acetaminophen (Tylenol), Serum <10 (L) 10 - 30 ug/mL    Comment:        THERAPEUTIC CONCENTRATIONS VARY SIGNIFICANTLY. A RANGE OF 10-30 ug/mL MAY BE AN EFFECTIVE CONCENTRATION FOR MANY PATIENTS. HOWEVER, SOME ARE BEST TREATED AT CONCENTRATIONS OUTSIDE THIS RANGE. ACETAMINOPHEN CONCENTRATIONS >150 ug/mL AT 4 HOURS AFTER INGESTION AND >50 ug/mL AT 12 HOURS AFTER INGESTION ARE OFTEN ASSOCIATED WITH TOXIC REACTIONS.   Magnesium     Status: None   Collection Time: 03/12/16  6:15 AM  Result Value Ref Range   Magnesium 2.0 1.7 - 2.4 mg/dL  Phosphorus     Status: None   Collection Time: 03/12/16  6:15 AM  Result Value Ref Range   Phosphorus 4.3 2.5 - 4.6 mg/dL  Basic metabolic panel     Status: Abnormal   Collection Time: 03/12/16  6:15 AM  Result Value Ref Range   Sodium 140 135 - 145 mmol/L   Potassium 3.4 (L) 3.5 - 5.1 mmol/L   Chloride 110 101 - 111 mmol/L   CO2 22 22 - 32 mmol/L   Glucose, Bld 84 65 - 99 mg/dL   BUN 6 6 - 20 mg/dL   Creatinine, Ser 0.62 0.44 - 1.00 mg/dL   Calcium 8.5 (L) 8.9 - 10.3 mg/dL   GFR calc non Af Amer >60 >60 mL/min   GFR calc Af Amer >60 >60 mL/min    Comment: (NOTE) The eGFR has been calculated using the CKD EPI equation. This calculation has not been validated in all clinical situations. eGFR's persistently <60 mL/min signify possible Chronic Kidney Disease.    Anion gap 8 5 - 15  CBC     Status: Abnormal   Collection Time: 03/12/16  6:15 AM  Result Value Ref Range   WBC 9.8 3.6 - 11.0 K/uL   RBC 4.04 3.80 - 5.20 MIL/uL   Hemoglobin 12.2 12.0 - 16.0 g/dL   HCT 35.5 35.0 - 47.0 %   MCV 87.8 80.0 - 100.0 fL   MCH 30.1 26.0 -  34.0 pg   MCHC 34.3 32.0 - 36.0 g/dL   RDW 14.7 (H) 11.5 - 14.5 %   Platelets 191 150 - 440 K/uL    Current Facility-Administered Medications  Medication Dose Route Frequency Provider Last Rate Last Dose  . 0.9 %  sodium chloride infusion   Intravenous Continuous Alexis Hugelmeyer, DO 75 mL/hr at 03/12/16 0610 75 mL/hr at 03/12/16 0610  . acetaminophen (TYLENOL) tablet 650 mg  650 mg Oral Q6H PRN Alexis Hugelmeyer, DO       Or  . acetaminophen (TYLENOL) suppository 650 mg  650 mg Rectal Q6H PRN Alexis Hugelmeyer, DO      . albuterol (PROVENTIL) (2.5 MG/3ML) 0.083% nebulizer solution 2.5 mg  2.5 mg Nebulization Q6H PRN Alexis Hugelmeyer, DO      . bisacodyl (DULCOLAX) EC tablet 5 mg  5 mg Oral Daily PRN Alexis Hugelmeyer, DO      . cholecalciferol (VITAMIN D) tablet 2,000 Units  2,000 Units Oral Daily Alexis Hugelmeyer, DO   2,000 Units at 03/12/16 0836  . cyclobenzaprine (FLEXERIL) tablet 10 mg  10 mg Oral TID PRN Alexis Hugelmeyer, DO      . enoxaparin (LOVENOX) injection 40 mg  40 mg Subcutaneous Q24H Alexis Hugelmeyer, DO      . gabapentin (NEURONTIN) tablet 1,200 mg  1,200 mg Oral TID Alexis Hugelmeyer, DO   1,200 mg at 03/12/16 1522  . hydrOXYzine (ATARAX/VISTARIL) tablet 50 mg  50 mg Oral Q12H PRN Alexis Hugelmeyer, DO      . ipratropium (ATROVENT) nebulizer solution 0.5 mg  0.5 mg Nebulization Q6H PRN Alexis Hugelmeyer, DO      . ketorolac (TORADOL) 30 MG/ML injection 30 mg  30 mg Intravenous Q6H Alexis Hugelmeyer, DO   30 mg at 03/12/16 1249  . magnesium citrate solution 1 Bottle  1 Bottle Oral Once PRN Alexis Hugelmeyer, DO      . morphine 2 MG/ML injection 1 mg  1 mg Intravenous Q4H PRN Alexis Hugelmeyer, DO      . naloxone (NARCAN) injection 1 mg  1 mg Intravenous Once Merlyn Lot, MD      . OLANZapine [facility]) tablet 20 mg  20 mg Oral TID Alexis Hugelmeyer, DO   20 mg at 03/12/16 1522  . oxyCODONE (Oxy IR/ROXICODONE) immediate release tablet 15 mg  15 mg Oral Q4H PRN Alexis  Hugelmeyer, DO   15 mg at 03/12/16 1526  . pantoprazole (PROTONIX) EC tablet 40 mg  40 mg Oral Daily Alexis Hugelmeyer, DO   40 mg at 03/12/16 0835  . [START ON 03/13/2016] pneumococcal 23 valent vaccine (PNU-IMMUNE) injection 0.5 mL  0.5 mL Intramuscular Tomorrow-1000 Pavan Pyreddy, MD      . promethazine (PHENERGAN) tablet 12.5 mg  12.5 mg Oral Q6H PRN Saundra Shelling, MD   12.5 mg at 03/12/16 1249  . propranolol (INDERAL) tablet 40 mg  40 mg Oral TID Alexis Hugelmeyer, DO   40 mg at 03/12/16 0836  . senna-docusate (Senokot-S) tablet 1 tablet  1 tablet Oral QHS PRN Alexis Hugelmeyer, DO      . senna-docusate (Senokot-S) tablet 1 tablet  1 tablet Oral QHS Vaughan Basta, MD      . sodium chloride flush (NS) 0.9 % injection 3 mL  3 mL Intravenous Q12H Alexis Hugelmeyer, DO   3 mL at 03/12/16 1528  . tiZANidine (ZANAFLEX) tablet 4 mg  4 mg Oral Q6H PRN Alexis Hugelmeyer, DO   4 mg at 03/12/16 3474  . traZODone (DESYREL) tablet 300 mg  300 mg Oral QHS Vaughan Basta, MD      . vitamin B-12 (CYANOCOBALAMIN) tablet 1,000 mcg  1,000 mcg Oral Daily Alexis Hugelmeyer, DO   1,000 mcg at 03/12/16 0836  . zolpidem (AMBIEN) tablet 10 mg  10 mg Oral QHS PRN Vaughan Basta, MD        Musculoskeletal: Strength & Muscle Tone: decreased Gait & Station: unable to stand Patient leans: N/A  Psychiatric Specialty Exam: Physical Exam  Nursing note and vitals reviewed. Constitutional: She appears well-developed and well-nourished.  HENT:  Head: Normocephalic and atraumatic.  Eyes: Conjunctivae are normal. Pupils are equal, round, and reactive to light.  Neck: Normal range of motion.  Cardiovascular: Regular rhythm  and normal heart sounds.   Respiratory: No respiratory distress.  GI: Soft.  Musculoskeletal: Normal range of motion.  Neurological: She is alert.  Skin: Skin is warm and dry.  Psychiatric: Her speech is normal and behavior is normal. Her affect is blunt. Thought content is not  paranoid. She expresses impulsivity. She expresses no homicidal and no suicidal ideation. She exhibits abnormal recent memory.    Review of Systems  Constitutional: Negative.   HENT: Negative.   Eyes: Negative.   Respiratory: Negative.   Cardiovascular: Negative.   Gastrointestinal: Negative.   Musculoskeletal: Positive for myalgias.  Skin: Negative.   Neurological: Negative.   Psychiatric/Behavioral: Negative.     Blood pressure (!) 101/59, pulse 88, temperature 98 F (36.7 C), temperature source Oral, resp. rate 18, height 5' 5"  (1.651 m), weight 82.5 kg (181 lb 12.8 oz), SpO2 100 %.Body mass index is 30.25 kg/m.  General Appearance: Casual  Eye Contact:  Minimal  Speech:  Clear and Coherent  Volume:  Decreased  Mood:  Irritable  Affect:  Congruent  Thought Process:  Goal Directed  Orientation:  Full (Time, Place, and Person)  Thought Content:  Logical  Suicidal Thoughts:  No  Homicidal Thoughts:  No  Memory:  Immediate;   Good Recent;   Good Remote;   Fair  Judgement:  Impaired  Insight:  Shallow  Psychomotor Activity:  Normal  Concentration:  Concentration: Fair  Recall:  [employer] of Knowledge:  Fair  Language:  Fair  Akathisia:  No  Handed:  Right  AIMS (if indicated):     Assets:  Housing  ADL's:  Intact  Cognition:  WNL  Sleep:        Treatment Plan Summary: Plan 46 year old woman who overdosed on narcotics. She has had multiple presentations to the hospital over the last several years with similar or identical presentations. I pointed this out to the patient and she said that it didn't seem like a big deal to her and that anyway those were a long time ago. This is not actually true. She has been having them just over the past year. Patient is prescribed an extraordinarily high amount of narcotics for a very questionable diagnosis and rationale but it is clear that her pain physicians intend to continue doing this. I spent some time educating her about the  [place] day of overdoses of narcotics and the dangers of combining narcotics with other sedating pain medicines. She expresses no interest in this and expresses that she feels confident continuing to take her medicines the way that she has been. Sign of psychosis or mood disorder. I have discontinue the benzodiazepines that were in the order set as there is no indication for them. I would certainly not give her any extra sedating medications.  Disposition: Patient does not meet criteria for psychiatric inpatient admission. Supportive therapy provided about ongoing stressors.  Alethia Berthold, MD 03/12/2016 8:31 PM

## 2016-03-12 NOTE — Care Management (Addendum)
Patient has Medicaid and that provides no PT services under patient's current diagnosis. It will cover nursing, social work and nurse aide if medically necessary.

## 2016-03-12 NOTE — Progress Notes (Signed)
Assessment by Laurier Nancy RN.

## 2016-03-12 NOTE — ED Notes (Signed)
Pt walking to the bathroom independently without any distress

## 2016-03-13 MED ORDER — SODIUM CHLORIDE 0.9 % IV BOLUS (SEPSIS)
500.0000 mL | Freq: Once | INTRAVENOUS | Status: AC
  Administered 2016-03-13: 500 mL via INTRAVENOUS

## 2016-03-13 MED ORDER — CYCLOBENZAPRINE HCL 10 MG PO TABS: 10.0000 mg | ORAL_TABLET | Freq: Three times a day (TID) | ORAL | 0 refills | Status: DC | PRN

## 2016-03-13 NOTE — Progress Notes (Signed)
Pt Bps have been low. BP 76/40 at 2343. MD called and new orders for 500 ml fluid bolus received. Pt bp increased to 81/42. MD called and another order received for 500 ml bolus. Will continue to monitor.

## 2016-03-13 NOTE — Discharge Instructions (Signed)
Follow with pain clinic in [place], as you have been following .  Do not take extra dose of pain medicine or sedative meds with pain meds.

## 2016-03-13 NOTE — Discharge Summary (Signed)
[facility] [place] NAME: Leslie Marks    MR#:  [id number]  DATE OF BIRTH:  Mar 15, 1970  DATE OF ADMISSION:  03/11/2016 ADMITTING PHYSICIAN: Harvie Bridge, DO  DATE OF DISCHARGE: 03/13/2016  PRIMARY CARE PHYSICIAN: No PCP Per Patient    ADMISSION DIAGNOSIS:  Opiate overdose, accidental or unintentional, sequela [T40.601S]  DISCHARGE DIAGNOSIS:  Principal Problem:   Opiate overdose Active Problems:   Generalized abdominal pain   SECONDARY DIAGNOSIS:   Past Medical History:  Diagnosis Date  . Asthma   . B12 deficiency   . Benzodiazepine dependence ([place])   . CHF (congestive heart failure) ([place])   . Chronic abdominal pain   . Chronic anticoagulation   . Chronic anxiety   . Chronic pain syndrome   . Collagen vascular disease ([place])   . Depression   . DVT (deep venous thrombosis) ([place])   . Encephalopathy   . Fibromyalgia   . GERD (gastroesophageal reflux disease)   . Hypoglycemia   . Iron deficiency anemia   . Lumbago   . Migraine   . Non-diabetic hypoglycemia   . Opiate dependence ([place])   . Overdose   . Pulmonary emboli ([place]) 2007  . Vitamin D deficiency     HOSPITAL COURSE:   This is a 46 y.o.femalewith a history of CHF, DVT, PE, stroke, opiate dependence, benzo dependencenow being admitted with:  1. Unintentional opiate overdose - Given narcane drip- stopped now as pt is fully alert and oriented, and have pain due to opioid reversal. - Monitor for signs of opiate withdrawal. CIWA ordered. -Telemetry and continuous pulse ox -Pain management consultation for ongoing pain control- pain clinic does not see inpatient, pt goes to pain clinic in [place] confirmed her dose with pharmacy and we resume on the same dose here, psych consult. -Gen. surgery consult did for staple removal. -Repeat Tylenol level to monitor as there was some mention of Percocet ingestion as well. Not high. - Maximize  pain control with Toradol and Tylenol for now. Resume her home pain meds as confirmed with pharmacy. - Psych suggested not to take sedative with opioid pain meds.  2. Hypokalemia, mild -Replace by mouth  3. History of anxiety -Hold Klonopin for now, continue hydroxyzine as needed  4. History of CHF -Continue propranolol with hold parameters  5. History of insomnia - HoldAmbien and trazodone on admission, resume now.  6. History of CVA with right upper extremity contracture -Continue Zanaflex and Flexeril as needed  7. History of GERD -Continue Nexium  8. Hyperension   She taked propranolol 3 times daily.   Her bp is running on lower normal and low in 80's in night.   I will hold that medicine on discharge.  DISCHARGE CONDITIONS:   Stable.  CONSULTS OBTAINED:  Treatment Team:  Clayburn Pert, MD Gonzella Lex, MD  DRUG ALLERGIES:   Allergies  Allergen Reactions  . Amoxicillin Anaphylaxis  . Augmentin [Amoxicillin-Pot Clavulanate] Anaphylaxis  . Betadine [Povidone Iodine] Anaphylaxis  . Ciprofloxacin Anaphylaxis  . Erythromycin Anaphylaxis  . Latex Anaphylaxis  . Penicillins Anaphylaxis    Has patient had a PCN reaction causing immediate rash, facial/tongue/throat swelling, SOB or lightheadedness with hypotension: yes Has patient had a PCN reaction causing severe rash involving mucus membranes or skin necrosis: no Has patient had a PCN reaction that required hospitalization no Has patient had a PCN reaction occurring within the last 10 years: no If all of  the above answers are "NO", then may proceed with Cephalosporin use.   . Adhesive [Tape] Other (See Comments)    Skin "bubbles" and blisters  . Lyrica [Pregabalin] Swelling    DISCHARGE MEDICATIONS:   Current Discharge Medication List    CONTINUE these medications which have CHANGED   Details  cyclobenzaprine (FLEXERIL) 10 MG tablet Take 1 tablet (10 mg total) by mouth 3 (three) times daily as  needed for muscle spasms. Qty: 30 tablet, Refills: 0      CONTINUE these medications which have NOT CHANGED   Details  albuterol (PROVENTIL HFA;VENTOLIN HFA) 108 (90 Base) MCG/ACT inhaler Inhale 2 puffs into the lungs every 4 (four) hours as needed for wheezing or shortness of breath.    esomeprazole (NEXIUM) 40 MG capsule Take 40 mg by mouth 2 (two) times daily.    gabapentin (NEURONTIN) 600 MG tablet Take 1,200 mg by mouth 3 (three) times daily.     hydrOXYzine (ATARAX/VISTARIL) 50 MG tablet Take 50 mg by mouth every 12 (twelve) hours as needed.    OLANZapine (ZYPREXA) 20 MG tablet Take 40 mg by mouth at bedtime.     ondansetron (ZOFRAN) 8 MG tablet Take 8 mg by mouth every 8 (eight) hours as needed for nausea or vomiting.    Oxycodone HCl 10 MG TABS Take 10 mg by mouth every 6 (six) hours. rx was written for q 5-6 h with max of 5 tabs qd    promethazine (PHENERGAN) 25 MG tablet Take 25 mg by mouth every 6 (six) hours as needed for nausea or vomiting.    tiZANidine (ZANAFLEX) 4 MG tablet Take 4 mg by mouth 2 (two) times daily. Per pharmacy, rx was written for 1 bid    traZODone (DESYREL) 150 MG tablet Take 300 mg by mouth at bedtime.      STOP taking these medications     clonazePAM (KLONOPIN) 2 MG tablet      Cyanocobalamin 1000 MCG/ML KIT      propranolol (INDERAL) 40 MG tablet      Tapentadol HCl (NUCYNTA ER) 200 MG TB12      zolpidem (AMBIEN) 10 MG tablet      cholecalciferol (VITAMIN D) 1000 UNITS tablet      Tapentadol HCl 100 MG TABS      vitamin B-12 (CYANOCOBALAMIN) 1000 MCG tablet          DISCHARGE INSTRUCTIONS:    Follow with pain clinic, and try to cut down pain medicine use over time.  If you experience worsening of your admission symptoms, develop shortness of breath, life threatening emergency, suicidal or homicidal thoughts you must seek medical attention immediately by calling 911 or calling your MD immediately  if symptoms less severe.  You  Must read complete instructions/literature along with all the possible adverse reactions/side effects for all the Medicines you take and that have been prescribed to you. Take any new Medicines after you have completely understood and accept all the possible adverse reactions/side effects.   Please note  You were cared for by a hospitalist during your hospital stay. If you have any questions about your discharge medications or the care you received while you were in the hospital after you are discharged, you can call the unit and asked to speak with the hospitalist on call if the hospitalist that took care of you is not available. Once you are discharged, your primary care physician will handle any further medical issues. Please note that NO REFILLS for  any discharge medications will be authorized once you are discharged, as it is imperative that you return to your primary care physician (or establish a relationship with a primary care physician if you do not have one) for your aftercare needs so that they can reassess your need for medications and monitor your lab values.    Today   CHIEF COMPLAINT:   Chief Complaint  Patient presents with  . Drug Overdose    HISTORY OF PRESENT ILLNESS:  Leslie Marks  is a 46 y.o. female with a known history of CHF, DVT, PE, stroke, opiate dependence, benzo dependence was in a usual state of health until about one month ago she had a cholecystectomy with a bowel resection to repair a long-standing and nonhealing fistula at [facility] on 01/30/2016. She had staples placed but due to whether she was unable to have them removed. She presented to wound care to have the staples removed and they were unable to do so. She reports an acute worsening of her pain for which she doubled her oxycodone and her Nucynta ER dosing taking for 10 mg tabs of oxycodone and 4 100 mg tabs of Nucynta. Her roommate found her with decreased respirations, altered mental status. Patient states  that she had no intention of harming herself she was just in excruciating pain secondary to the manipulation of the staples. EMS provided 2 mg of intranasal Narcan with resolution of her symptoms. In the emergency department patient was found to have a recurrence of symptoms including altered mental status and hypotension. She received another 2 doses of Narcan intravenously and then was started on a Narcan drip. Poison control was called, her pain medications were verified.   Here in the emergency department patient complains of severe 7 out of 10 abdominal pain at the site of the staples.  Otherwise there has been no change in status. Patient has otherwise been taking medication as prescribed and there has been no recent change in medication or diet.  No recent antibiotics.  There has been no recent illness, hospitalizations, travel or sick contacts.    Patient denies fevers/chills, weakness, dizziness, chest pain, shortness of breath, N/V/C/D, abdominal pain, dysuria/frequency, suicidal ideation/homicidal ideation.    VITAL SIGNS:  Blood pressure (!) 93/52, pulse 67, temperature 97.9 F (36.6 C), temperature source Oral, resp. rate 19, height _0  (1.651 m), weight 82.5 kg (181 lb 12.8 oz), SpO2 99 %.  I/O:   Intake/Output Summary (Last 24 hours) at 03/13/16 0937 Last data filed at 03/13/16 0335  Gross per 24 hour  Intake          2798.75 ml  Output                0 ml  Net          2798.75 ml    PHYSICAL EXAMINATION:   GENERAL:  46 y.o.-year-old patient lying in the bed with no acute distress.  EYES: Pupils equal, round, reactive to light and accommodation. No scleral icterus. Extraocular muscles intact.  HEENT: Head atraumatic, normocephalic. Oropharynx and nasopharynx clear.  NECK:  Supple, no jugular venous distention. No thyroid enlargement, no tenderness.  LUNGS: Normal breath sounds bilaterally, no wheezing, rales,rhonchi or crepitation. No use of accessory muscles of  respiration.  CARDIOVASCULAR: S1, S2 normal. No murmurs, rubs, or gallops.  ABDOMEN: Soft, mild tender, nondistended. Bowel sounds present. No organomegaly or mass. Surgical scar present on abdomen. EXTREMITIES: No pedal edema, cyanosis, or clubbing.  NEUROLOGIC: Cranial nerves II  through XII are intact. Muscle strength 5/5 in all extremities. Sensation intact. Gait not checked. Deformity with atrophy present on right hand due to old stroke. PSYCHIATRIC: The patient is alert and oriented x 3.  SKIN: No obvious rash, lesion, or ulcer.   DATA REVIEW:   CBC  Recent Labs Lab 03/12/16 0615  WBC 9.8  HGB 12.2  HCT 35.5  PLT 191    Chemistries   Recent Labs Lab 03/11/16 1602 03/12/16 0615  NA 134* 140  K 3.3* 3.4*  CL 105 110  CO2 24 22  GLUCOSE 95 84  BUN 9 6  CREATININE 1.11* 0.62  CALCIUM 8.5* 8.5*  MG  --  2.0  AST 23  --   ALT 16  --   ALKPHOS 139*  --   BILITOT 0.5  --     Cardiac Enzymes No results for input(s): TROPONINI in the last 168 hours.  Microbiology Results  Results for orders placed or performed during the hospital encounter of 04/26/13  MRSA PCR Screening     Status: None   Collection Time: 04/27/13  1:55 AM  Result Value Ref Range Status   MRSA by PCR NEGATIVE NEGATIVE Final    Comment:        The GeneXpert MRSA Assay (FDA approved for NASAL specimens only), is one component of a comprehensive MRSA colonization surveillance program. It is not intended to diagnose MRSA infection nor to guide or monitor treatment for MRSA infections.    RADIOLOGY:  Ct Head Wo Contrast  Result Date: 03/11/2016 CLINICAL DATA:  Acute encephalopathy. EXAM: CT HEAD WITHOUT CONTRAST TECHNIQUE: Contiguous axial images were obtained from the base of the skull through the vertex without intravenous contrast. COMPARISON:  09/30/2014 FINDINGS: Brain: There is no evidence for acute hemorrhage, hydrocephalus, mass lesion, or abnormal extra-axial fluid collection. No  definite CT evidence for acute infarction. Vascular: No hyperdense vessel or unexpected calcification. Skull: No evidence for fracture. No worrisome lytic or sclerotic lesion. Sinuses/Orbits: Visualized portions of the globes and intraorbital fat are unremarkable. The visualized paranasal sinuses and mastoid air cells are clear. Other: None. IMPRESSION: Normal CT brain Electronically Signed   By: Misty Stanley M.D.   On: 03/11/2016 21:15    EKG:   Orders placed or performed during the hospital encounter of 03/11/16  . EKG 12-Lead  . EKG 12-Lead  . ED EKG  . ED EKG  . EKG 12-Lead  . EKG 12-Lead  . EKG 12-Lead  . EKG      Management plans discussed with the patient, family and they are in agreement.  CODE STATUS:     Code Status Orders        Start     Ordered   03/12/16 0542  Full code  Continuous     03/12/16 0541    Code Status History    Date Active Date Inactive Code Status Order ID Comments User Context   09/30/2014  8:57 PM 10/03/2014  6:30 PM Full Code [id number]  Lytle Butte, MD ED   04/27/2013  2:00 AM 04/29/2013  7:55 PM Full Code [id number]  Bynum Bellows, MD Inpatient      TOTAL TIME TAKING CARE OF THIS PATIENT: 35 minutes.    Vaughan Basta M.D on 03/13/2016 at 9:37 AM  Between 7am to 6pm - Pager - [phone]  After 6pm go to [email or web address] - password [email or web address] [facility]  Office  [phone]  CC: Primary care physician; No  PCP Per Patient   Note: This dictation was prepared with Dragon dictation along with smaller phrase technology. Any transcriptional errors that result from this process are unintentional.

## 2016-04-22 ENCOUNTER — Encounter: Payer: Self-pay | Admitting: *Deleted

## 2016-04-22 ENCOUNTER — Emergency Department
Admission: EM | Admit: 2016-04-22 | Discharge: 2016-04-23 | Disposition: A | Discharge status: Home / Self Care | Payer: Medicaid Other | Attending: Emergency Medicine | Admitting: Emergency Medicine

## 2016-04-22 DIAGNOSIS — I509 Heart failure, unspecified: Secondary | ICD-10-CM | POA: Insufficient documentation

## 2016-04-22 DIAGNOSIS — T507X4A Poisoning by analeptics and opioid receptor antagonists, undetermined, initial encounter: Secondary | ICD-10-CM | POA: Diagnosis present

## 2016-04-22 DIAGNOSIS — Z79899 Other long term (current) drug therapy: Secondary | ICD-10-CM | POA: Insufficient documentation

## 2016-04-22 DIAGNOSIS — F1110 Opioid abuse, uncomplicated: Secondary | ICD-10-CM | POA: Diagnosis not present

## 2016-04-22 DIAGNOSIS — Z046 Encounter for general psychiatric examination, requested by authority: Secondary | ICD-10-CM

## 2016-04-22 DIAGNOSIS — J45909 Unspecified asthma, uncomplicated: Secondary | ICD-10-CM | POA: Insufficient documentation

## 2016-04-22 DIAGNOSIS — T402X4A Poisoning by other opioids, undetermined, initial encounter: Secondary | ICD-10-CM

## 2016-04-22 LAB — COMPREHENSIVE METABOLIC PANEL
ALT: 40 U/L (ref 14–54)
ANION GAP: 8 (ref 5–15)
AST: 44 U/L — ABNORMAL HIGH (ref 15–41)
Albumin: 3.7 g/dL (ref 3.5–5.0)
Alkaline Phosphatase: 143 U/L — ABNORMAL HIGH (ref 38–126)
BILIRUBIN TOTAL: 0.5 mg/dL (ref 0.3–1.2)
BUN: 16 mg/dL (ref 6–20)
CO2: 23 mmol/L (ref 22–32)
Calcium: 8.9 mg/dL (ref 8.9–10.3)
Chloride: 106 mmol/L (ref 101–111)
Creatinine, Ser: 0.89 mg/dL (ref 0.44–1.00)
Glucose, Bld: 94 mg/dL (ref 65–99)
POTASSIUM: 4.5 mmol/L (ref 3.5–5.1)
Sodium: 137 mmol/L (ref 135–145)
TOTAL PROTEIN: 7.3 g/dL (ref 6.5–8.1)

## 2016-04-22 LAB — URINE DRUG SCREEN, QUALITATIVE (ARMC ONLY)
AMPHETAMINES, UR SCREEN: NOT DETECTED
BENZODIAZEPINE, UR SCRN: NOT DETECTED
Barbiturates, Ur Screen: NOT DETECTED
CANNABINOID 50 NG, UR ~~LOC~~: NOT DETECTED
Cocaine Metabolite,Ur ~~LOC~~: NOT DETECTED
MDMA (Ecstasy)Ur Screen: NOT DETECTED
Methadone Scn, Ur: POSITIVE — AB
Opiate, Ur Screen: POSITIVE — AB
PHENCYCLIDINE (PCP) UR S: NOT DETECTED
TRICYCLIC, UR SCREEN: NOT DETECTED

## 2016-04-22 LAB — CBC
HCT: 39.1 % (ref 35.0–47.0)
Hemoglobin: 12.9 g/dL (ref 12.0–16.0)
MCH: 30.2 pg (ref 26.0–34.0)
MCHC: 33 g/dL (ref 32.0–36.0)
MCV: 91.5 fL (ref 80.0–100.0)
PLATELETS: 229 10*3/uL (ref 150–440)
RBC: 4.28 MIL/uL (ref 3.80–5.20)
RDW: 15.7 % — AB (ref 11.5–14.5)
WBC: 5.8 10*3/uL (ref 3.6–11.0)

## 2016-04-22 LAB — ETHANOL

## 2016-04-22 LAB — ACETAMINOPHEN LEVEL: Acetaminophen (Tylenol), Serum: <10 ug/mL — ABNORMAL LOW (ref 10–30)

## 2016-04-22 LAB — SALICYLATE LEVEL

## 2016-04-22 NOTE — ED Notes (Signed)
Pt IVC'd.  Pt's Nucynta 200mg  counted with Hal Neer, RN due to patient being unable to verify medications with RN at this time.

## 2016-04-22 NOTE — ED Triage Notes (Signed)
Pt ambulatory to triage.  Pt brought in by [employer].  Pt is IVC,  Pt states she has chronic pain and took 7 extra Nucyato at 1900 tonight. Pt has nausea.  Pt alert.  Pt calm and cooperative.  Pt denies SI or HI.  pt denies ETOH or other drug use.

## 2016-04-22 NOTE — ED Notes (Signed)
Gave pt food tray and ginger ale

## 2016-04-22 NOTE — ED Notes (Signed)
While dressing pt out into paper scrubs, pt had pill bottle of Nucyenta pills in her labia.  Pill bottle with meds in taken and given to [employer].  No other medicines found on pt by this nurse.

## 2016-04-22 NOTE — ED Provider Notes (Signed)
[facility] Emergency Department Provider Note   ____________________________________________   First MD Initiated Contact with Patient 04/22/16 2151     (approximate)  I have reviewed the triage vital signs and the nursing notes.   HISTORY  Chief Complaint Behavior Problem and Drug Overdose   HPI Leslie Marks is a 46 y.o. female here for reported concerns under IVC Sheriff.  EMS was called to the patient's home, evidently patient was agitated and refusing to allow care and concerned that she had taken extra of her pain medicine. The patient does freely report to taking too much of her pain medication, she reports that she did take some extra tonight. However, she denies that this is a temp to harm herself. Denies homicidal or suicidal ideation. Denies alcohol or drug use. Patient states she was taking extra of her medication, can't really tell me why. In addition, there is reported that the patient had taken her pill bottle and placed it into her vagina or labia and attempt to hide it, and it has been retrieved prior to arrival  No fevers or chills. No nausea or vomiting. Patient reported she takes the Nucynta pain medicine her chronic pain after recent surgery. Denies any change in her chronic pain. No nausea or vomiting. No fevers or chills.  Past Medical History:  Diagnosis Date  . Asthma   . B12 deficiency   . Benzodiazepine dependence ([place])   . CHF (congestive heart failure) ([place])   . Chronic abdominal pain   . Chronic anticoagulation   . Chronic anxiety   . Chronic pain syndrome   . Collagen vascular disease ([place])   . Depression   . DVT (deep venous thrombosis) ([place])   . Encephalopathy   . Fibromyalgia   . GERD (gastroesophageal reflux disease)   . Hypoglycemia   . Iron deficiency anemia   . Lumbago   . Migraine   . Non-diabetic hypoglycemia   . Opiate dependence ([place])   . Overdose   . Pulmonary emboli ([place]) 2007  . Vitamin D  deficiency     Patient Active Problem List   Diagnosis Date Noted  . Opiate overdose 03/12/2016  . Generalized abdominal pain   . Benzodiazepine overdose 09/30/2014  . Encephalopathy 09/30/2014  . 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 Malabsorption syndrome 05/27/2013  . Cholelithiasis 05/12/2013  . Lupus anticoagulant positive 04/29/2013  . 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 Altered mental status 04/27/2013  . Pulmonary emboli ([place]) 04/27/2013  . Chronic pain syndrome 04/27/2013  . Chronic anxiety 04/27/2013  . Non-diabetic hypoglycemia 04/27/2013  . Acute encephalopathy 04/26/2013  . Depression 04/26/2013  . Chronic anticoagulation 04/26/2013  . Pancytopenia ([place]) 04/26/2013    Past Surgical History:  Procedure Laterality Date  . ABDOMINAL HYSTERECTOMY    . APPENDECTOMY    . CESAREAN SECTION    . CHOLECYSTECTOMY    . GASTRIC BYPASS  2003  . TONSILLECTOMY      Prior to Admission medications   Medication Sig Start Date End Date Taking? Authorizing Provider  albuterol (PROVENTIL HFA;VENTOLIN HFA) 108 (90 Base) MCG/ACT inhaler Inhale 2 puffs into the lungs every 4 (four) hours as needed for wheezing or shortness of breath.   Yes Historical Provider, MD  gabapentin (NEURONTIN) 600 MG tablet Take 1,200 mg by mouth 3 (three) times daily.    Yes Historical Provider, MD  naloxone [facility]) nasal spray 4 mg/0.1 mL Place 1 spray  into the nose once.   Yes Historical Provider, MD  OLANZapine (ZYPREXA) 20 MG tablet Take 40 mg by mouth at bedtime.    Yes Historical Provider, MD  Oxycodone HCl 10 MG TABS Take 10 mg by mouth every 6 (six) hours. rx was written for q 5-6 h with max of 5 tabs qd   Yes Historical Provider, MD  propranolol (INDERAL) 40 MG tablet Take 40 mg by mouth 3 (three) times daily.   Yes Historical Provider, MD  Tapentadol HCl 200 MG TB12 Take 1 tablet by mouth 2 (two) times daily.   Yes Historical Provider, MD    tiZANidine (ZANAFLEX) 4 MG tablet Take 4 mg by mouth 2 (two) times daily. Per pharmacy, rx was written for 1 bid   Yes Historical Provider, MD  topiramate (TOPAMAX) 100 MG tablet Take 100 mg by mouth daily.    Yes Historical Provider, MD  traZODone (DESYREL) 150 MG tablet Take 300 mg by mouth at bedtime.   Yes Historical Provider, MD  zolpidem (AMBIEN) 10 MG tablet Take 5-10 mg by mouth at bedtime as needed for sleep.    Yes Historical Provider, MD    Allergies Amoxicillin; Augmentin [amoxicillin-pot clavulanate]; Betadine [povidone iodine]; Ciprofloxacin; Erythromycin; Latex; Penicillins; Adhesive [tape]; and Lyrica [pregabalin]  Family History  Problem Relation Age of Onset  . Heart failure Neg Hx     Social History Social History  Substance Use Topics  . Smoking status: Never Smoker  . Smokeless tobacco: Never Used  . Alcohol use No    Review of Systems Constitutional: No fever/chills Eyes: No visual changes. ENT: No sore throat. Cardiovascular: Denies chest pain. Respiratory: Denies shortness of breath. Gastrointestinal: No abdominal pain.  No nausea, no vomiting.  Genitourinary: Negative for dysuria. Musculoskeletal: Negative for back pain. Skin: Negative for rash. Neurological: Negative for headaches, focal weakness or numbness.  10-point ROS otherwise negative.  ____________________________________________   PHYSICAL EXAM:  VITAL SIGNS: ED Triage Vitals  Enc Vitals Group     BP 04/22/16 2112 122/86     Pulse Rate 04/22/16 2110 83     Resp 04/22/16 2110 20     Temp 04/22/16 2110 97.8 F (36.6 C)     Temp Source 04/22/16 2110 Oral     SpO2 04/22/16 2110 96 %     Weight 04/22/16 2111 170 lb (77.1 kg)     Height 04/22/16 2111 5\' 6"  (1.676 m)     Head Circumference --      Peak Flow --      Pain Score 04/22/16 2112 8     Pain Loc --      Pain Edu? --      Excl. in [place]? --     Constitutional: Alert and oriented. Well appearing and in no acute  distress. Eyes: Conjunctivae are normal. PERRL. EOMI. Head: Atraumatic. Nose: No congestion/rhinnorhea. Mouth/Throat: Mucous membranes are moist.  Oropharynx non-erythematous. Neck: No stridor.   Cardiovascular: Normal rate, regular rhythm. Grossly normal heart sounds.  Good peripheral circulation. Respiratory: Normal respiratory effort.  No retractions. Lungs CTAB. Gastrointestinal: Soft and nontender. No distention. Rather large, well-healing midline abdominal incision. Musculoskeletal: No lower extremity tenderness nor edema.   Neurologic:  Normal speech and language. No gross focal neurologic deficits are appreciated.  Skin:  Skin is warm, dry and intact. No rash noted. Psychiatric: Mood and affect are normal. Speech and behavior are normal.  ____________________________________________   LABS (all labs ordered are listed, but only abnormal results are  displayed)  Labs Reviewed  COMPREHENSIVE METABOLIC PANEL - Abnormal; Notable for the following:       Result Value   AST 44 (*)    Alkaline Phosphatase 143 (*)    All other components within normal limits  ACETAMINOPHEN LEVEL - Abnormal; Notable for the following:    Acetaminophen (Tylenol), Serum <10 (*)    All other components within normal limits  CBC - Abnormal; Notable for the following:    RDW 15.7 (*)    All other components within normal limits  URINE DRUG SCREEN, QUALITATIVE ([facility] ONLY) - Abnormal; Notable for the following:    Opiate, Ur Screen POSITIVE (*)    Methadone Scn, Ur POSITIVE (*)    All other components within normal limits  ETHANOL  SALICYLATE LEVEL   ____________________________________________  EKG   ____________________________________________  RADIOLOGY   ____________________________________________   PROCEDURES  Procedure(s) performed: None  Procedures  Critical Care performed: No  ____________________________________________   INITIAL IMPRESSION / ASSESSMENT AND PLAN / ED  COURSE  Pertinent labs & imaging results that were available during my care of the patient were reviewed by me and considered in my medical decision making (see chart for details).  Patient here under involuntary commitment. Circumstance somewhat unclear, but the patient does freely admits taking too much for her medication which is Nucynta. She denies any attempt to harm herself or  anyone else She is awake alert and oriented, in no apparent distress. Denies any acute medical illness.  ----------------------------------------- 11:45 PM on 04/22/2016 -----------------------------------------  Ongoing care assigned Dr. Dahlia Client. Patient awaiting psychiatry consultation, under involuntary commitment. Continue to observe the patient ER as she does self-report overdose on a long-acting narcotic. She presently shows no signs of oversedation.      ____________________________________________   FINAL CLINICAL IMPRESSION(S) / ED DIAGNOSES  Final diagnoses:  Opioid overdose, undetermined intent, initial encounter  Involuntary commitment      NEW MEDICATIONS STARTED DURING THIS VISIT:  New Prescriptions   No medications on file     Note:  This document was prepared using Dragon voice recognition software and may include unintentional dictation errors.     Delman Kitten, MD 04/22/16 [phone]

## 2016-04-23 DIAGNOSIS — F1110 Opioid abuse, uncomplicated: Secondary | ICD-10-CM

## 2016-04-23 MED ORDER — OLANZAPINE 10 MG PO TABS: 40.0000 mg | ORAL_TABLET | Freq: Every day | ORAL | Status: DC

## 2016-04-23 MED ORDER — PROPRANOLOL HCL 20 MG PO TABS
40.0000 mg | ORAL_TABLET | Freq: Three times a day (TID) | ORAL | Status: DC
  Administered 2016-04-23: 40 mg via ORAL
  Filled 2016-04-23: qty 2

## 2016-04-23 MED ORDER — TIZANIDINE HCL 4 MG PO TABS
4.0000 mg | ORAL_TABLET | Freq: Once | ORAL | Status: DC
  Filled 2016-04-23: qty 1

## 2016-04-23 MED ORDER — TOPIRAMATE 25 MG PO TABS
100.0000 mg | ORAL_TABLET | Freq: Every day | ORAL | Status: DC
  Administered 2016-04-23: 100 mg via ORAL
  Filled 2016-04-23: qty 4

## 2016-04-23 MED ORDER — GABAPENTIN 300 MG PO CAPS
600.0000 mg | ORAL_CAPSULE | Freq: Once | ORAL | Status: AC
  Administered 2016-04-23: 600 mg via ORAL
  Filled 2016-04-23: qty 2

## 2016-04-23 NOTE — ED Notes (Signed)
Patient alert and oriented; pt ambulated to bathroom no assistance and given shower toiletries and instructions; pt completed and bed linens changed

## 2016-04-23 NOTE — Consult Note (Signed)
[facility] Consult   Reason for Consult:  Consult for 46 year old woman with a history of prescription drug abuse brought to the hospital under commitment papers Referring Physician:  Jimmye Norman Patient Identification: Leslie Marks MRN:  [id number] Principal Diagnosis: Opiate abuse, episodic Diagnosis:   Patient Active Problem List   Diagnosis Date Noted  . Opiate abuse, episodic [F11.10] 04/23/2016  . Opiate overdose [T40.601A] 03/12/2016  . Generalized abdominal pain [R10.84]   . Benzodiazepine overdose [T42.4X1A] 09/30/2014  . Encephalopathy [G93.40] 09/30/2014  . H/O gastric bypass [Z98.890] 05/27/2013  . 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 Malabsorption syndrome [K90.9] 05/27/2013  . Cholelithiasis [K80.20] 05/12/2013  . Lupus anticoagulant positive [R76.0] 04/29/2013  . 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 Altered mental status [R41.82] 04/27/2013  . Pulmonary emboli ([place]) [I26.99] 04/27/2013  . Chronic pain syndrome [G89.4] 04/27/2013  . Chronic anxiety [F41.9] 04/27/2013  . Non-diabetic hypoglycemia [E16.2] 04/27/2013  . Acute encephalopathy [G93.40] 04/26/2013  . Depression [F32.9] 04/26/2013  . Chronic anticoagulation [Z79.01] 04/26/2013  . Pancytopenia [facility]) [D61.818] 04/26/2013    Total Time spent with patient: 1 hour  Subjective:   Leslie Marks is a 46 y.o. female patient admitted with "they just make these things up to try to rob me".  HPI:  Patient interviewed. Chart reviewed. 46 year old woman with a history of prescription drug abuse was brought to the hospital last night under commitment papers. Paperwork filed states that the patient had overdosed on her narcotics. Patient is recorded as having admitted to the triage nurse in 7 extra narcotic pills last night. Denied any suicidal ideation. On interview with me today she denies even having done that. She states that she has been using her prescription medicine  exactly as prescribed. She claims that her roommate and son conspired to take out commitment papers on her so that they can try to get access to her medication. Denies depression denies suicidal ideation denies any hallucinations. Denies that she's been drinking or using any other drugs.  Social history: Patient lives with a roommate. She gets disability doesn't do very much during the day.  Medical history: Fibromyalgia for which she has disability and takes chronic high-dose narcotic pain medicine.  Substance abuse history: Well documented history of opiate abuse. She is chronically on high doses of narcotics and has had several presentations to the hospital with overdoses that appeared to be accidental. No insight  Past Psychiatric History: Patient has been evaluated previously under similar circumstances and has denied having any suicidal or homicidal ideation. Denies depression. She currently sees an outpatient psychiatrist at [facility] and takes a modest dose of an antidepressant. I don't believe she's ever had an actual suicide attempt.  Risk to Self: Is patient at risk for suicide?: No Risk to Others:   Prior Inpatient Therapy:   Prior Outpatient Therapy:    Past Medical History:  Past Medical History:  Diagnosis Date  . Asthma   . B12 deficiency   . Benzodiazepine dependence ([place])   . CHF (congestive heart failure) ([place])   . Chronic abdominal pain   . Chronic anticoagulation   . Chronic anxiety   . Chronic pain syndrome   . Collagen vascular disease ([place])   . Depression   . DVT (deep venous thrombosis) ([place])   . Encephalopathy   . Fibromyalgia   . GERD (gastroesophageal reflux disease)   . Hypoglycemia   . Iron deficiency anemia   . Lumbago   . Migraine   .  Non-diabetic hypoglycemia   . Opiate dependence ([place])   . Overdose   . Pulmonary emboli ([place]) 2007  . Vitamin D deficiency     Past Surgical History:  Procedure Laterality Date  . ABDOMINAL HYSTERECTOMY    .  APPENDECTOMY    . CESAREAN SECTION    . CHOLECYSTECTOMY    . GASTRIC BYPASS  2003  . TONSILLECTOMY     Family History:  Family History  Problem Relation Age of Onset  . Heart failure Neg Hx    Family Psychiatric  History: Positive for depression and substance abuse Social History:  History  Alcohol Use No     History  Drug Use No    Social History   Social History  . Marital status: Single    Spouse name: N/A  . Number of children: N/A  . Years of education: N/A   Social History Main Topics  . Smoking status: Never Smoker  . Smokeless tobacco: Never Used  . Alcohol use No  . Drug use: No  . Sexual activity: Not Asked   Other Topics Concern  . None   Social History Narrative  . None   Additional Social History:    Allergies:   Allergies  Allergen Reactions  . Amoxicillin Anaphylaxis  . Augmentin [Amoxicillin-Pot Clavulanate] Anaphylaxis  . Betadine [Povidone Iodine] Anaphylaxis  . Ciprofloxacin Anaphylaxis  . Erythromycin Anaphylaxis  . Latex Anaphylaxis  . Penicillins Anaphylaxis    Has patient had a PCN reaction causing immediate rash, facial/tongue/throat swelling, SOB or lightheadedness with hypotension: yes Has patient had a PCN reaction causing severe rash involving mucus membranes or skin necrosis: no Has patient had a PCN reaction that required hospitalization no Has patient had a PCN reaction occurring within the last 10 years: no If all of the above answers are "NO", then may proceed with Cephalosporin use.   . Adhesive [Tape] Other (See Comments)    Skin "bubbles" and blisters  . Lyrica [Pregabalin] Swelling    Labs:  Results for orders placed or performed during the hospital encounter of 04/22/16 (from the past 48 hour(s))  Comprehensive metabolic panel     Status: Abnormal   Collection Time: 04/22/16  9:20 PM  Result Value Ref Range   Sodium 137 135 - 145 mmol/L   Potassium 4.5 3.5 - 5.1 mmol/L   Chloride 106 101 - 111 mmol/L   CO2  23 22 - 32 mmol/L   Glucose, Bld 94 65 - 99 mg/dL   BUN 16 6 - 20 mg/dL   Creatinine, Ser 0.89 0.44 - 1.00 mg/dL   Calcium 8.9 8.9 - 10.3 mg/dL   Total Protein 7.3 6.5 - 8.1 g/dL   Albumin 3.7 3.5 - 5.0 g/dL   AST 44 (H) 15 - 41 U/L   ALT 40 14 - 54 U/L   Alkaline Phosphatase 143 (H) 38 - 126 U/L   Total Bilirubin 0.5 0.3 - 1.2 mg/dL   GFR calc non Af Amer >60 >60 mL/min   GFR calc Af Amer >60 >60 mL/min    Comment: (NOTE) The eGFR has been calculated using the CKD EPI equation. This calculation has not been validated in all clinical situations. eGFR's persistently <60 mL/min signify possible Chronic Kidney Disease.    Anion gap 8 5 - 15  Ethanol     Status: None   Collection Time: 04/22/16  9:20 PM  Result Value Ref Range   Alcohol, Ethyl (B) <5 <5 mg/dL    Comment:  LOWEST DETECTABLE LIMIT FOR SERUM ALCOHOL IS 5 mg/dL FOR MEDICAL PURPOSES ONLY   Salicylate level     Status: None   Collection Time: 04/22/16  9:20 PM  Result Value Ref Range   Salicylate Lvl <8.2 2.8 - 30.0 mg/dL  Acetaminophen level     Status: Abnormal   Collection Time: 04/22/16  9:20 PM  Result Value Ref Range   Acetaminophen (Tylenol), Serum <10 (L) 10 - 30 ug/mL    Comment:        THERAPEUTIC CONCENTRATIONS VARY SIGNIFICANTLY. A RANGE OF 10-30 ug/mL MAY BE AN EFFECTIVE CONCENTRATION FOR MANY PATIENTS. HOWEVER, SOME ARE BEST TREATED AT CONCENTRATIONS OUTSIDE THIS RANGE. ACETAMINOPHEN CONCENTRATIONS >150 ug/mL AT 4 HOURS AFTER INGESTION AND >50 ug/mL AT 12 HOURS AFTER INGESTION ARE OFTEN ASSOCIATED WITH TOXIC REACTIONS.   cbc     Status: Abnormal   Collection Time: 04/22/16  9:20 PM  Result Value Ref Range   WBC 5.8 3.6 - 11.0 K/uL   RBC 4.28 3.80 - 5.20 MIL/uL   Hemoglobin 12.9 12.0 - 16.0 g/dL   HCT 39.1 35.0 - 47.0 %   MCV 91.5 80.0 - 100.0 fL   MCH 30.2 26.0 - 34.0 pg   MCHC 33.0 32.0 - 36.0 g/dL   RDW 15.7 (H) 11.5 - 14.5 %   Platelets 229 150 - 440 K/uL  Urine Drug  Screen, Qualitative     Status: Abnormal   Collection Time: 04/22/16  9:20 PM  Result Value Ref Range   Tricyclic, Ur Screen NONE DETECTED NONE DETECTED   Amphetamines, Ur Screen NONE DETECTED NONE DETECTED   MDMA (Ecstasy)Ur Screen NONE DETECTED NONE DETECTED   Cocaine Metabolite,Ur [place] NONE DETECTED NONE DETECTED   Opiate, Ur Screen POSITIVE (A) NONE DETECTED   Phencyclidine (PCP) Ur S NONE DETECTED NONE DETECTED   Cannabinoid 50 Ng, Ur [place] NONE DETECTED NONE DETECTED   Barbiturates, Ur Screen NONE DETECTED NONE DETECTED   Benzodiazepine, Ur Scrn NONE DETECTED NONE DETECTED   Methadone Scn, Ur POSITIVE (A) NONE DETECTED    Comment: (NOTE) 500  Tricyclics, urine               Cutoff 1000 ng/mL 200  Amphetamines, urine             Cutoff 1000 ng/mL 300  MDMA (Ecstasy), urine           Cutoff 500 ng/mL 400  Cocaine Metabolite, urine       Cutoff 300 ng/mL 500  Opiate, urine                   Cutoff 300 ng/mL 600  Phencyclidine (PCP), urine      Cutoff 25 ng/mL 700  Cannabinoid, urine              Cutoff 50 ng/mL 800  Barbiturates, urine             Cutoff 200 ng/mL 900  Benzodiazepine, urine           Cutoff 200 ng/mL 1000 Methadone, urine                Cutoff 300 ng/mL 1100 1200 The urine drug screen provides only a preliminary, unconfirmed 1300 analytical test result and should not be used for non-medical 1400 purposes. Clinical consideration and professional judgment should 1500 be applied to any positive drug screen result due to possible 1600 interfering substances. A more specific alternate chemical method 1700 must be used in  order to obtain a confirmed analytical result.  1800 Gas chromato graphy / mass spectrometry (GC/MS) is the preferred 1900 confirmatory method.     Current Facility-Administered Medications  Medication Dose Route Frequency Provider Last Rate Last Dose  . OLANZapine (ZYPREXA) tablet 40 mg  40 mg Oral QHS Earleen Newport, MD      . propranolol  (INDERAL) tablet 40 mg  40 mg Oral TID Earleen Newport, MD   40 mg at 04/23/16 1127  . tiZANidine (ZANAFLEX) tablet 4 mg  4 mg Oral Once Earleen Newport, MD      . topiramate (TOPAMAX) tablet 100 mg  100 mg Oral Daily Earleen Newport, MD   100 mg at 04/23/16 1128   Current Outpatient Prescriptions  Medication Sig Dispense Refill  . albuterol (PROVENTIL HFA;VENTOLIN HFA) 108 (90 Base) MCG/ACT inhaler Inhale 2 puffs into the lungs every 4 (four) hours as needed for wheezing or shortness of breath.    . gabapentin (NEURONTIN) 600 MG tablet Take 1,200 mg by mouth 3 (three) times daily.     . naloxone (NARCAN) nasal spray 4 mg/0.1 mL Place 1 spray into the nose once.    Marland Kitchen OLANZapine (ZYPREXA) 20 MG tablet Take 40 mg by mouth at bedtime.     . Oxycodone HCl 10 MG TABS Take 10 mg by mouth every 6 (six) hours. rx was written for q 5-6 h with max of 5 tabs qd    . propranolol (INDERAL) 40 MG tablet Take 40 mg by mouth 3 (three) times daily.    . Tapentadol HCl 200 MG TB12 Take 1 tablet by mouth 2 (two) times daily.    Marland Kitchen tiZANidine (ZANAFLEX) 4 MG tablet Take 4 mg by mouth 2 (two) times daily. Per pharmacy, rx was written for 1 bid    . topiramate (TOPAMAX) 100 MG tablet Take 100 mg by mouth daily.     . traZODone (DESYREL) 150 MG tablet Take 300 mg by mouth at bedtime.    Marland Kitchen zolpidem (AMBIEN) 10 MG tablet Take 5-10 mg by mouth at bedtime as needed for sleep.       Musculoskeletal: Strength & Muscle Tone: within normal limits Gait & Station: normal Patient leans: N/A  Psychiatric Specialty Exam: Physical Exam  Nursing note and vitals reviewed. Constitutional: She appears well-developed and well-nourished.  HENT:  Head: Normocephalic and atraumatic.  Eyes: Conjunctivae are normal. Pupils are equal, round, and reactive to light.  Neck: Normal range of motion.  Cardiovascular: Regular rhythm and normal heart sounds.   Respiratory: Effort normal. No respiratory distress.  GI: Soft.   Musculoskeletal: Normal range of motion.  Neurological: She is alert.  Skin: Skin is warm and dry.  Psychiatric: She has a normal mood and affect. Her speech is normal and behavior is normal. Thought content normal. Cognition and memory are normal. She expresses impulsivity.    Review of Systems  Constitutional: Negative.   HENT: Negative.   Eyes: Negative.   Respiratory: Negative.   Cardiovascular: Negative.   Gastrointestinal: Negative.   Musculoskeletal: Positive for back pain and myalgias.  Skin: Negative.   Neurological: Negative.   Psychiatric/Behavioral: Negative.     Blood pressure 122/86, pulse 83, temperature 97.8 F (36.6 C), temperature source Oral, resp. rate 20, height 5' 6"  (1.676 m), weight 77.1 kg (170 lb), SpO2 96 %.Body mass index is 27.44 kg/m.  General Appearance: Casual  Eye Contact:  Good  Speech:  Clear and Coherent  Volume:  Normal  Mood:  Euthymic  Affect:  Congruent  Thought Process:  Goal Directed  Orientation:  Full (Time, Place, and Person)  Thought Content:  Logical  Suicidal Thoughts:  No  Homicidal Thoughts:  No  Memory:  Immediate;   Good Recent;   Fair Remote;   Fair  Judgement:  Fair  Insight:  Fair  Psychomotor Activity:  Decreased  Concentration:  Concentration: Fair  Recall:  [employer] of Knowledge:  Fair  Language:  Fair  Akathisia:  No  Handed:  Right  AIMS (if indicated):     Assets:  Desire for Improvement Financial Resources/Insurance Housing Resilience  ADL's:  Intact  Cognition:  WNL  Sleep:        Treatment Plan Summary: Plan On interview and evaluation today there is no evidence that the patient had intentionally tried to harm herself and at this point she is even denying taking extra doses of her medicine. Her mental state is currently unremarkable with normal affect and behavior and normal cognition. Patient was educated again about the high doses of narcotics that she takes in the danger of adding extra doses  or taking any other kind of intoxicating substances with her medicine. She expresses understanding. No indication for hospitalization. Patient can be released from the emergency room discharged home and will follow-up with her usual providers.  Disposition: No evidence of imminent risk to self or others at present.   Patient does not meet criteria for psychiatric inpatient admission.  Alethia Berthold, MD 04/23/2016 3:42 PM

## 2016-04-23 NOTE — ED Notes (Signed)
Pt given a coke to drink and graham crackers to eat.

## 2016-05-08 ENCOUNTER — Other Ambulatory Visit: Payer: Self-pay | Admitting: Pain Medicine

## 2016-05-30 ENCOUNTER — Encounter: Payer: Self-pay | Admitting: Intensive Care

## 2016-05-30 ENCOUNTER — Emergency Department: Payer: Medicaid Other

## 2016-05-30 ENCOUNTER — Emergency Department
Admission: EM | Admit: 2016-05-30 | Discharge: 2016-05-30 | Disposition: A | Discharge status: Home / Self Care | Payer: Medicaid Other | Attending: Emergency Medicine | Admitting: Emergency Medicine

## 2016-05-30 DIAGNOSIS — Z79899 Other long term (current) drug therapy: Secondary | ICD-10-CM | POA: Insufficient documentation

## 2016-05-30 DIAGNOSIS — T50901A Poisoning by unspecified drugs, medicaments and biological substances, accidental (unintentional), initial encounter: Secondary | ICD-10-CM | POA: Diagnosis present

## 2016-05-30 DIAGNOSIS — Z7901 Long term (current) use of anticoagulants: Secondary | ICD-10-CM | POA: Insufficient documentation

## 2016-05-30 DIAGNOSIS — I509 Heart failure, unspecified: Secondary | ICD-10-CM | POA: Insufficient documentation

## 2016-05-30 DIAGNOSIS — J45909 Unspecified asthma, uncomplicated: Secondary | ICD-10-CM | POA: Diagnosis not present

## 2016-05-30 LAB — URINALYSIS, COMPLETE (UACMP) WITH MICROSCOPIC
Bacteria, UA: NONE SEEN
Bilirubin Urine: NEGATIVE
GLUCOSE, UA: NEGATIVE mg/dL
Hgb urine dipstick: NEGATIVE
Ketones, ur: NEGATIVE mg/dL
NITRITE: NEGATIVE
PH: 5 (ref 5.0–8.0)
PROTEIN: NEGATIVE mg/dL
Specific Gravity, Urine: 1.029 (ref 1.005–1.030)

## 2016-05-30 LAB — CBC WITH DIFFERENTIAL/PLATELET
Basophils Absolute: 0 10*3/uL (ref 0–0.1)
Basophils Relative: 1 %
EOS ABS: 0.2 10*3/uL (ref 0–0.7)
Eosinophils Relative: 4 %
HCT: 34.8 % — ABNORMAL LOW (ref 35.0–47.0)
HEMOGLOBIN: 11.5 g/dL — AB (ref 12.0–16.0)
LYMPHS ABS: 1.3 10*3/uL (ref 1.0–3.6)
Lymphocytes Relative: 27 %
MCH: 29.4 pg (ref 26.0–34.0)
MCHC: 32.9 g/dL (ref 32.0–36.0)
MCV: 89.2 fL (ref 80.0–100.0)
Monocytes Absolute: 0.5 10*3/uL (ref 0.2–0.9)
Monocytes Relative: 10 %
NEUTROS PCT: 60 %
Neutro Abs: 3 10*3/uL (ref 1.4–6.5)
Platelets: 168 10*3/uL (ref 150–440)
RBC: 3.9 MIL/uL (ref 3.80–5.20)
RDW: 14.7 % — ABNORMAL HIGH (ref 11.5–14.5)
WBC: 5 10*3/uL (ref 3.6–11.0)

## 2016-05-30 LAB — URINE DRUG SCREEN, QUALITATIVE (ARMC ONLY)
AMPHETAMINES, UR SCREEN: POSITIVE — AB
Barbiturates, Ur Screen: NOT DETECTED
Benzodiazepine, Ur Scrn: NOT DETECTED
Cannabinoid 50 Ng, Ur ~~LOC~~: NOT DETECTED
Cocaine Metabolite,Ur ~~LOC~~: NOT DETECTED
MDMA (ECSTASY) UR SCREEN: NOT DETECTED
Methadone Scn, Ur: POSITIVE — AB
Opiate, Ur Screen: POSITIVE — AB
PHENCYCLIDINE (PCP) UR S: NOT DETECTED
Tricyclic, Ur Screen: NOT DETECTED

## 2016-05-30 LAB — COMPREHENSIVE METABOLIC PANEL
ALK PHOS: 171 U/L — AB (ref 38–126)
ALT: 34 U/L (ref 14–54)
ANION GAP: 7 (ref 5–15)
AST: 56 U/L — ABNORMAL HIGH (ref 15–41)
Albumin: 3.7 g/dL (ref 3.5–5.0)
BILIRUBIN TOTAL: 0.6 mg/dL (ref 0.3–1.2)
BUN: 14 mg/dL (ref 6–20)
CALCIUM: 9.1 mg/dL (ref 8.9–10.3)
CO2: 24 mmol/L (ref 22–32)
Chloride: 109 mmol/L (ref 101–111)
Creatinine, Ser: 0.97 mg/dL (ref 0.44–1.00)
GFR calc non Af Amer: >60 mL/min (ref >60)
Glucose, Bld: 90 mg/dL (ref 65–99)
Potassium: 3.4 mmol/L — ABNORMAL LOW (ref 3.5–5.1)
SODIUM: 140 mmol/L (ref 135–145)
TOTAL PROTEIN: 6.9 g/dL (ref 6.5–8.1)

## 2016-05-30 LAB — ETHANOL

## 2016-05-30 LAB — ACETAMINOPHEN LEVEL: ACETAMINOPHEN (TYLENOL), SERUM: 29 ug/mL (ref 10–30)

## 2016-05-30 LAB — TROPONIN I: Troponin I: <0.03 ng/mL (ref <0.03)

## 2016-05-30 LAB — SALICYLATE LEVEL: Salicylate Lvl: 7.8 mg/dL (ref 2.8–30.0)

## 2016-05-30 MED ORDER — SODIUM CHLORIDE 0.9 % IV SOLN
Freq: Once | INTRAVENOUS | Status: AC
  Administered 2016-05-30: 15:00:00 via INTRAVENOUS

## 2016-05-30 MED ORDER — ONDANSETRON HCL 4 MG/2ML IJ SOLN
4.0000 mg | Freq: Once | INTRAMUSCULAR | Status: AC
  Administered 2016-05-30: 4 mg via INTRAVENOUS

## 2016-05-30 MED ORDER — NALOXONE HCL 0.4 MG/ML IJ SOLN: INTRAMUSCULAR | 1 refills | Status: AC

## 2016-05-30 MED ORDER — ONDANSETRON HCL 4 MG/2ML IJ SOLN
INTRAMUSCULAR | Status: AC
  Administered 2016-05-30: 4 mg via INTRAVENOUS
  Filled 2016-05-30: qty 2

## 2016-05-30 MED ORDER — NALOXONE HCL 2 MG/2ML IJ SOSY
0.4000 mg | PREFILLED_SYRINGE | Freq: Once | INTRAMUSCULAR | Status: AC
  Administered 2016-05-30: 0.4 mg via INTRAVENOUS
  Filled 2016-05-30: qty 2

## 2016-05-30 NOTE — ED Notes (Signed)
Patient given refreshments and crackers.

## 2016-05-30 NOTE — ED Triage Notes (Signed)
Patient arrived by EMS for possible overdose. Daughter in law called EMS. PAtient states "my daughter in law thought I overdosed but I didn't" Pt is very lethargic on and off. HX stroke and now bedridden. HX overdosing on depression meds. A&O x4 when awake

## 2016-05-30 NOTE — ED Provider Notes (Signed)
[facility] Emergency Department Provider Note       Time seen: ----------------------------------------- 2:21 PM on 05/30/2016 -----------------------------------------     I have reviewed the triage vital signs and the nursing notes.   HISTORY   Chief Complaint Drug Overdose    HPI Leslie Marks is a 46 y.o. female who presents to the ED for possible overdose. Daughter-in-law called EMS because she was concerned the patient had overdosed but patient states she did not overdose that she is taking medications like she's post 2. Patient does have a history of overdosing on depression medication. While she is awake she is alert and oriented.   Past Medical History:  Diagnosis Date  . Asthma   . B12 deficiency   . Benzodiazepine dependence ([place])   . CHF (congestive heart failure) ([place])   . Chronic abdominal pain   . Chronic anticoagulation   . Chronic anxiety   . Chronic pain syndrome   . Collagen vascular disease ([place])   . Depression   . DVT (deep venous thrombosis) ([place])   . Encephalopathy   . Fibromyalgia   . GERD (gastroesophageal reflux disease)   . Hypoglycemia   . Iron deficiency anemia   . Lumbago   . Migraine   . Non-diabetic hypoglycemia   . Opiate dependence ([place])   . Overdose   . Pulmonary emboli ([place]) 2007  . Vitamin D deficiency     Patient Active Problem List   Diagnosis Date Noted  . Opiate abuse, episodic 04/23/2016  . Opiate overdose 03/12/2016  . Generalized abdominal pain   . Benzodiazepine overdose 09/30/2014  . Encephalopathy 09/30/2014  . 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 Malabsorption syndrome 05/27/2013  . Cholelithiasis 05/12/2013  . Lupus anticoagulant positive 04/29/2013  . 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 Altered mental status 04/27/2013  . Pulmonary emboli ([place]) 04/27/2013  . Chronic pain syndrome 04/27/2013  . Chronic anxiety 04/27/2013  .  Non-diabetic hypoglycemia 04/27/2013  . Acute encephalopathy 04/26/2013  . Depression 04/26/2013  . Chronic anticoagulation 04/26/2013  . Pancytopenia ([place]) 04/26/2013    Past Surgical History:  Procedure Laterality Date  . ABDOMINAL HYSTERECTOMY    . APPENDECTOMY    . CESAREAN SECTION    . CHOLECYSTECTOMY    . GASTRIC BYPASS  2003  . TONSILLECTOMY      Allergies Amoxicillin; Augmentin [amoxicillin-pot clavulanate]; Betadine [povidone iodine]; Ciprofloxacin; Erythromycin; Latex; Penicillins; Adhesive [tape]; and Lyrica [pregabalin]  Social History Social History  Substance Use Topics  . Smoking status: Never Smoker  . Smokeless tobacco: Never Used  . Alcohol use No    Review of Systems Constitutional: Negative for fever. Cardiovascular: Negative for chest pain. Respiratory: Negative for shortness of breath. Gastrointestinal: Negative for abdominal pain, vomiting and diarrhea. Genitourinary: Negative for dysuria. Musculoskeletal: Negative for back pain. Skin: Negative for rash. Neurological: Negative for headaches, Positive for generalized weakness  10-point ROS otherwise negative.  ____________________________________________   PHYSICAL EXAM:  VITAL SIGNS: ED Triage Vitals  Enc Vitals Group     BP 05/30/16 1358 125/82     Pulse Rate 05/30/16 1358 (!) 106     Resp 05/30/16 1358 10     Temp 05/30/16 1358 98.3 F (36.8 C)     Temp Source 05/30/16 1358 Oral     SpO2 05/30/16 1358 100 %     Weight 05/30/16 1400 160 lb (72.6 kg)     Height 05/30/16 1400 5'  5.5" (1.664 m)     Head Circumference --      Peak Flow --      Pain Score 05/30/16 1357 8     Pain Loc --      Pain Edu? --      Excl. in [place]? --     Constitutional: Drowsy, lethargic but arouses easily Eyes: Conjunctivae are normal. PERRL. Normal extraocular movements. ENT   Head: Normocephalic and atraumatic.   Nose: No congestion/rhinnorhea.   Mouth/Throat: Mucous membranes are moist.    Neck: No stridor. Cardiovascular: Normal rate, regular rhythm. No murmurs, rubs, or gallops. Respiratory: Normal respiratory effort without tachypnea nor retractions. Breath sounds are clear and equal bilaterally. No wheezes/rales/rhonchi. Gastrointestinal: Soft and nontender. Normal bowel sounds Musculoskeletal: Nontender with normal range of motion in extremities. No lower extremity tenderness nor edema. Neurologic:  Normal speech and language. No gross focal neurologic deficits are appreciated. Chronic weakness in the right arm Skin:  Skin is warm, dry and intact. No rash noted. Psychiatric: Poor insight ____________________________________________  EKG: Interpreted by me. Sinus tachycardia with a rate of 105 bpm, normal PR interval, Normal QRS, normal QT.  ____________________________________________  ED COURSE:  Pertinent labs & imaging results that were available during my care of the patient were reviewed by me and considered in my medical decision making (see chart for details). Patient presents for possible overdose, we will assess with labs and imaging as indicated.   Procedures ____________________________________________   LABS (pertinent positives/negatives)  Labs Reviewed  CBC WITH DIFFERENTIAL/PLATELET - Abnormal; Notable for the following:       Result Value   Hemoglobin 11.5 (*)    HCT 34.8 (*)    RDW 14.7 (*)    All other components within normal limits  COMPREHENSIVE METABOLIC PANEL - Abnormal; Notable for the following:    Potassium 3.4 (*)    AST 56 (*)    Alkaline Phosphatase 171 (*)    All other components within normal limits  TROPONIN I  ETHANOL  ACETAMINOPHEN LEVEL  SALICYLATE LEVEL  URINALYSIS, COMPLETE (UACMP) WITH MICROSCOPIC  URINE DRUG SCREEN, QUALITATIVE ([facility] ONLY)    RADIOLOGY Images were viewed by me  CT head, chest x-ray Are pending ____________________________________________  FINAL ASSESSMENT AND PLAN  Possible  overdose  Plan: Patient's labs and imaging were dictated above. Patient had presented for possible overdose. She does appear overmedicated but denies illicit overdose. Patient states she is not trying to harm herself, denies suicidal or homicidal ideation. She was much more alert after Narcan. She will be discharged with Narcan   Earleen Newport, MD   Note: This note was generated in part or whole with voice recognition software. Voice recognition is usually quite accurate but there are transcription errors that can and very often do occur. I apologize for any typographical errors that were not detected and corrected.     Earleen Newport, MD 05/30/16 [phone]

## 2016-05-30 NOTE — ED Notes (Signed)
Error in 125/82  first blood pressure charted

## 2016-05-30 NOTE — ED Notes (Signed)
Patient transported to CT 

## 2016-06-05 ENCOUNTER — Emergency Department: Payer: Medicaid Other

## 2016-06-05 ENCOUNTER — Emergency Department
Admission: EM | Admit: 2016-06-05 | Discharge: 2016-06-05 | Disposition: A | Discharge status: Home / Self Care | Payer: Medicaid Other | Attending: Emergency Medicine | Admitting: Emergency Medicine

## 2016-06-05 ENCOUNTER — Encounter: Payer: Self-pay | Admitting: Emergency Medicine

## 2016-06-05 DIAGNOSIS — R109 Unspecified abdominal pain: Secondary | ICD-10-CM | POA: Diagnosis present

## 2016-06-05 DIAGNOSIS — Z79899 Other long term (current) drug therapy: Secondary | ICD-10-CM | POA: Diagnosis not present

## 2016-06-05 DIAGNOSIS — M549 Dorsalgia, unspecified: Secondary | ICD-10-CM | POA: Diagnosis not present

## 2016-06-05 DIAGNOSIS — J45909 Unspecified asthma, uncomplicated: Secondary | ICD-10-CM | POA: Diagnosis not present

## 2016-06-05 DIAGNOSIS — R52 Pain, unspecified: Secondary | ICD-10-CM

## 2016-06-05 DIAGNOSIS — I509 Heart failure, unspecified: Secondary | ICD-10-CM | POA: Insufficient documentation

## 2016-06-05 LAB — URINALYSIS, COMPLETE (UACMP) WITH MICROSCOPIC
Bacteria, UA: NONE SEEN
GLUCOSE, UA: NEGATIVE mg/dL
HGB URINE DIPSTICK: NEGATIVE
Ketones, ur: NEGATIVE mg/dL
LEUKOCYTES UA: NEGATIVE
NITRITE: NEGATIVE
Protein, ur: 30 mg/dL — AB
SPECIFIC GRAVITY, URINE: 1.033 — AB (ref 1.005–1.030)
pH: 5 (ref 5.0–8.0)

## 2016-06-05 LAB — CBC WITH DIFFERENTIAL/PLATELET
BASOS ABS: 0 10*3/uL (ref 0–0.1)
Basophils Relative: 1 %
Eosinophils Absolute: 0.1 10*3/uL (ref 0–0.7)
Eosinophils Relative: 3 %
HEMATOCRIT: 39.2 % (ref 35.0–47.0)
HEMOGLOBIN: 12.9 g/dL (ref 12.0–16.0)
LYMPHS ABS: 1.2 10*3/uL (ref 1.0–3.6)
Lymphocytes Relative: 29 %
MCH: 29 pg (ref 26.0–34.0)
MCHC: 32.8 g/dL (ref 32.0–36.0)
MCV: 88.5 fL (ref 80.0–100.0)
MONO ABS: 0.4 10*3/uL (ref 0.2–0.9)
Monocytes Relative: 11 %
NEUTROS PCT: 56 %
Neutro Abs: 2.4 10*3/uL (ref 1.4–6.5)
Platelets: 192 10*3/uL (ref 150–440)
RBC: 4.43 MIL/uL (ref 3.80–5.20)
RDW: 14.7 % — AB (ref 11.5–14.5)
WBC: 4.3 10*3/uL (ref 3.6–11.0)

## 2016-06-05 LAB — BASIC METABOLIC PANEL
Anion gap: 9 (ref 5–15)
BUN: 13 mg/dL (ref 6–20)
CALCIUM: 8.9 mg/dL (ref 8.9–10.3)
CO2: 21 mmol/L — AB (ref 22–32)
CREATININE: 0.73 mg/dL (ref 0.44–1.00)
Chloride: 109 mmol/L (ref 101–111)
GFR calc non Af Amer: >60 mL/min (ref >60)
Glucose, Bld: 91 mg/dL (ref 65–99)
Potassium: 3.7 mmol/L (ref 3.5–5.1)
SODIUM: 139 mmol/L (ref 135–145)

## 2016-06-05 MED ORDER — ONDANSETRON HCL 4 MG/2ML IJ SOLN
4.0000 mg | Freq: Once | INTRAMUSCULAR | Status: AC
  Administered 2016-06-05: 4 mg via INTRAVENOUS

## 2016-06-05 MED ORDER — SODIUM CHLORIDE 0.9 % IV BOLUS (SEPSIS)
2000.0000 mL | Freq: Once | INTRAVENOUS | Status: AC
Start: 2016-06-05 — End: 2016-06-05
  Administered 2016-06-05: 2000 mL via INTRAVENOUS

## 2016-06-05 MED ORDER — MORPHINE SULFATE (PF) 4 MG/ML IV SOLN
4.0000 mg | Freq: Once | INTRAVENOUS | Status: AC
  Administered 2016-06-05: 4 mg via INTRAVENOUS
  Filled 2016-06-05: qty 1

## 2016-06-05 MED ORDER — ONDANSETRON HCL 4 MG/2ML IJ SOLN
4.0000 mg | Freq: Once | INTRAMUSCULAR | Status: AC
  Administered 2016-06-05: 4 mg via INTRAVENOUS
  Filled 2016-06-05: qty 2

## 2016-06-05 MED ORDER — MORPHINE SULFATE (PF) 4 MG/ML IV SOLN
4.0000 mg | Freq: Once | INTRAVENOUS | Status: AC
  Administered 2016-06-05: 2 mg via INTRAVENOUS

## 2016-06-05 MED ORDER — MORPHINE SULFATE (PF) 4 MG/ML IV SOLN
INTRAVENOUS | Status: AC
  Administered 2016-06-05: 2 mg via INTRAVENOUS
  Filled 2016-06-05: qty 1

## 2016-06-05 MED ORDER — ONDANSETRON HCL 4 MG/2ML IJ SOLN
INTRAMUSCULAR | Status: AC
  Administered 2016-06-05: 4 mg via INTRAVENOUS
  Filled 2016-06-05: qty 2

## 2016-06-05 MED ORDER — HYDROCODONE-ACETAMINOPHEN 5-325 MG PO TABS
ORAL_TABLET | ORAL | Status: AC
  Filled 2016-06-05: qty 1

## 2016-06-05 NOTE — Discharge Instructions (Signed)
The CAT scan looked okay no sign of any blockage no sign of any kidney stones. The urine was clear no sign of any infection. I'm not sure why you are having the pain and trouble urinating but everything at present looks okay. It might be that she passed a kidney stone. Drink plenty of fluids. Please return for any fever, vomiting or feeling sicker. You can use Advil or Tylenol if need be for the pain.

## 2016-06-05 NOTE — ED Triage Notes (Signed)
Pt c/o urinary retention for last week but has really been decreased over last 2 days. Pain to lower abdomen and left flank. History urinary problems that have needed hospitalized. Has also pain to right thigh and been out of lovenox X 1 month; doctor has not returned call per pt

## 2016-06-05 NOTE — ED Notes (Signed)
2 mg morphine given to pt.  4 mg not given as pts BP 104/72.  Pt informed that if she still has pain and BP did not drop, this RN would feel comfortable giving additional 2 mg morphine.  Pt verbalized understanding

## 2016-06-05 NOTE — ED Notes (Signed)
Pt able to void on own.

## 2016-06-05 NOTE — ED Provider Notes (Addendum)
[facility] Emergency Department Provider Note   ____________________________________________   First MD Initiated Contact with Patient 06/05/16 1259     (approximate)  I have reviewed the triage vital signs and the nursing notes.   HISTORY  Chief Complaint Urinary Retention    HPI Leslie Marks is a 46 y.o. female who reports she has not been urinating much in the last 2 weeks. She did have some blood in the urine as well. Her urine output is really decreased in the last 2 days. She complains of left flank pain abdominal pain and blood in the urine and fever up to 102 yesterday. She thinks she's had a UTI. The pain is moderate to severe. This worse with percussion over the CVA area.   Past Medical History:  Diagnosis Date  . Asthma   . B12 deficiency   . Benzodiazepine dependence ([place])   . CHF (congestive heart failure) ([place])   . Chronic abdominal pain   . Chronic anticoagulation   . Chronic anxiety   . Chronic pain syndrome   . Collagen vascular disease ([place])   . Depression   . DVT (deep venous thrombosis) ([place])   . Encephalopathy   . Fibromyalgia   . GERD (gastroesophageal reflux disease)   . Hypoglycemia   . Iron deficiency anemia   . Lumbago   . Migraine   . Non-diabetic hypoglycemia   . Opiate dependence ([place])   . Overdose   . Pulmonary emboli ([place]) 2007  . Vitamin D deficiency     Patient Active Problem List   Diagnosis Date Noted  . Opiate abuse, episodic 04/23/2016  . Opiate overdose 03/12/2016  . Generalized abdominal pain   . Benzodiazepine overdose 09/30/2014  . Encephalopathy 09/30/2014  . 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 Malabsorption syndrome 05/27/2013  . Cholelithiasis 05/12/2013  . Lupus anticoagulant positive 04/29/2013  . 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 Altered mental status 04/27/2013  . Pulmonary emboli ([place]) 04/27/2013  . Chronic pain syndrome  04/27/2013  . Chronic anxiety 04/27/2013  . Non-diabetic hypoglycemia 04/27/2013  . Acute encephalopathy 04/26/2013  . Depression 04/26/2013  . Chronic anticoagulation 04/26/2013  . Pancytopenia ([place]) 04/26/2013  Multiple other problems please see records from [place] and [place].  Past Surgical History:  Procedure Laterality Date  . ABDOMINAL HYSTERECTOMY    . APPENDECTOMY    . CESAREAN SECTION    . CHOLECYSTECTOMY    . GASTRIC BYPASS  2003  . TONSILLECTOMY      Prior to Admission medications   Medication Sig Start Date End Date Taking? Authorizing Provider  albuterol (PROVENTIL HFA;VENTOLIN HFA) 108 (90 Base) MCG/ACT inhaler Inhale 2 puffs into the lungs every 4 (four) hours as needed for wheezing or shortness of breath.   Yes Historical Provider, MD  gabapentin (NEURONTIN) 600 MG tablet Take 1,200 mg by mouth 3 (three) times daily.    Yes Historical Provider, MD  naloxone Karma Greaser) 0.4 MG/ML injection To be used as needed for overdose 05/30/16  Yes Earleen Newport, MD  OLANZapine (ZYPREXA) 20 MG tablet Take 40 mg by mouth at bedtime.    Yes Historical Provider, MD  ondansetron (ZOFRAN) 8 MG tablet Take 8 mg by mouth 3 (three) times daily.   Yes Historical Provider, MD  Oxycodone HCl 10 MG TABS Take 10 mg by mouth every 6 (six) hours. rx was written for q 5-6 h with max of 5 tabs  qd   Yes Historical Provider, MD  propranolol (INDERAL) 40 MG tablet Take 40 mg by mouth 3 (three) times daily.   Yes Historical Provider, MD  Tapentadol HCl 200 MG TB12 Take 1 tablet by mouth 2 (two) times daily.   Yes Historical Provider, MD  tiZANidine (ZANAFLEX) 4 MG tablet Take 4 mg by mouth 2 (two) times daily. Per pharmacy, rx was written for 1 bid   Yes Historical Provider, MD  topiramate (TOPAMAX) 100 MG tablet Take 100 mg by mouth daily.    Yes Historical Provider, MD  traZODone (DESYREL) 150 MG tablet Take 300 mg by mouth at bedtime.   Yes Historical Provider, MD  zolpidem (AMBIEN) 10 MG tablet Take  5-10 mg by mouth at bedtime as needed for sleep.    Yes Historical Provider, MD    Allergies Amoxicillin; Augmentin [amoxicillin-pot clavulanate]; Betadine [povidone iodine]; Ciprofloxacin; Erythromycin; Latex; Penicillins; Adhesive [tape]; and Lyrica [pregabalin]  Family History  Problem Relation Age of Onset  . Heart failure Neg Hx     Social History Social History  Substance Use Topics  . Smoking status: Never Smoker  . Smokeless tobacco: Never Used  . Alcohol use No    Review of Systems Constitutional: See history of present illness Eyes: No visual changes. ENT: No sore throat. Cardiovascular: Denies chest pain. Respiratory: Denies shortness of breath. Gastrointestinal: See history of present illness Genitourinary: See history of present illness Musculoskeletal: See history of present illness Skin: Negative for rash. Neurological: Negative for headaches, focal weakness or numbness.  10-point ROS otherwise negative.  ____________________________________________   PHYSICAL EXAM:  VITAL SIGNS: ED Triage Vitals  Enc Vitals Group     BP 06/05/16 1257 99/74     Pulse Rate 06/05/16 1256 67     Resp 06/05/16 1256 14     Temp --      Temp src --      SpO2 06/05/16 1256 100 %     Weight 06/05/16 1257 160 lb (72.6 kg)     Height 06/05/16 1257 5\' 5"  (1.651 m)     Head Circumference --      Peak Flow --      Pain Score 06/05/16 1256 10     Pain Loc --      Pain Edu? --      Excl. in [place]? --     Constitutional: Alert and oriented. Well appearing and in no acute distress. Eyes: Conjunctivae are normal. PERRL. EOMI. Head: Atraumatic. Nose: No congestion/rhinnorhea. Mouth/Throat: Mucous membranes are moist.  Oropharynx non-erythematous. Neck: No stridor. Cardiovascular: Normal rate, regular rhythm. Grossly normal heart sounds.  Good peripheral circulation. Respiratory: Normal respiratory effort.  No retractions. Lungs CTAB. Gastrointestinal: Soft and nontender. No  distention. No abdominal bruits. Left CVA tenderness. Musculoskeletal: Patient complains of some pain in the right leg she thinks she might have a blood clot and it feels like when she had a blood clot before..  No joint effusions. Neurologic:  Normal speech and language. No new gross focal neurologic deficits are appreciated. 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 Skin:  Skin is warm, dry and intact. No rash noted.  ____________________________________________   LABS (all labs ordered are listed, but only abnormal results are displayed)  Labs Reviewed  BASIC METABOLIC PANEL - Abnormal; Notable for the following:       Result Value   CO2 21 (*)    All other components within normal limits  CBC WITH DIFFERENTIAL/PLATELET - Abnormal; Notable for the following:    RDW 14.7 (*)    All other components within normal limits  URINALYSIS, COMPLETE (UACMP) WITH MICROSCOPIC - Abnormal; Notable for the following:    Color, Urine AMBER (*)    APPearance CLEAR (*)    Specific Gravity, Urine 1.033 (*)    Bilirubin Urine MODERATE (*)    Protein, ur 30 (*)    Squamous Epithelial / LPF 0-5 (*)    All other components within normal limits  URINE CULTURE   ____________________________________________  EKG   ____________________________________________  RADIOLOGY  Study Result   CLINICAL DATA:  History of right DVT. Follow-up. Off Lovenox for 1 month with pain.  EXAM: Right LOWER EXTREMITY VENOUS DOPPLER ULTRASOUND  TECHNIQUE: Gray-scale sonography with graded compression, as well as color Doppler and duplex ultrasound were performed to evaluate the lower extremity deep venous systems from the level of the common femoral vein and including the common femoral, femoral, profunda femoral, popliteal and calf veins including the posterior tibial, peroneal and gastrocnemius veins when visible. The superficial great saphenous vein was  also interrogated. Spectral Doppler was utilized to evaluate flow at rest and with distal augmentation maneuvers in the common femoral, femoral and popliteal veins.  COMPARISON:  09/10/2006  FINDINGS: Contralateral Common Femoral Vein: Respiratory phasicity is normal and symmetric with the symptomatic side. No evidence of thrombus. Normal compressibility.  Common Femoral Vein: No evidence of thrombus. Normal compressibility, respiratory phasicity and response to augmentation.  Saphenofemoral Junction: No evidence of thrombus. Normal compressibility and flow on color Doppler imaging.  Profunda Femoral Vein: No evidence of thrombus. Normal compressibility and flow on color Doppler imaging.  Femoral Vein: No evidence of thrombus. Normal compressibility, respiratory phasicity and response to augmentation.  Popliteal Vein: No evidence of thrombus. Normal compressibility, respiratory phasicity and response to augmentation.  Calf Veins: No evidence of thrombus. Normal compressibility and flow on color Doppler imaging.  Superficial Great Saphenous Vein: No evidence of thrombus. Normal compressibility and flow on color Doppler imaging.  Venous Reflux:  None.  Other Findings:  None.  IMPRESSION: No evidence of deep venous thrombosis.   Electronically Signed   By: Kerby Moors M.D.   On: 06/05/2016 14:36    Study Result   CLINICAL DATA:  Lower abdominal and left flank pain.  EXAM: CT ABDOMEN AND PELVIS WITHOUT CONTRAST  TECHNIQUE: Multidetector CT imaging of the abdomen and pelvis was performed following the standard protocol without IV contrast.  COMPARISON:  CT of the abdomen and pelvis 05/19/2013  FINDINGS: Lower chest: Minimal dependent atelectasis bilaterally. No significant airspace disease is present. Significant pleural upper fusion is present.  Hepatobiliary: The patient is status post cholecystectomy. Intra and extrahepatic biliary  dilation has increased. The common bile duct now measures up to 16 mm. No obstructing mass lesion is present. No discrete hepatic lesions are present.  Pancreas: Unremarkable. No pancreatic ductal dilatation or surrounding inflammatory changes.  Spleen: Borderline splenic enlargement is again seen. No discrete mass lesion is present.  Adrenals/Urinary Tract: The adrenal glands are normal bilaterally. Kidneys and ureters are within normal limits. A Foley catheter is present within the urinary bladder.  Stomach/Bowel: Gastric surgery is again noted. A small hiatal hernia is stable. Visualized duodenum is normal. Small bowel is unremarkable. Previous small bowel surgeries are noted. There is no obstruction lower abdominal adhesions are present without obstruction. The colon is mostly collapsed.  Vascular/Lymphatic: Vertebral body heights are maintained. IVC filter is stable.  No significant vascular lesions are present.  Reproductive: Status post hysterectomy. No adnexal masses.  Other: No significant free fluid or free air is present.  Musculoskeletal: No acute or significant osseous findings.  IMPRESSION: 1. No acute or focal lesion to explain the patient's symptoms. 2. Multiple prior bowel surgeries without focal obstruction. Adhesions are likely. 3. A Foley catheter is present within the urinary bladder. 4. The kidneys and ureters are otherwise unremarkable. 5. Increased intra and extrahepatic biliary dilation following cholecystectomy.   Electronically Signed   By: San Morelle M.D.   On: 06/05/2016 15:20     ____________________________________________   PROCEDURES  Procedure(s) performed:  Procedures  Critical Care performed:  ____________________________________________   INITIAL IMPRESSION / ASSESSMENT AND PLAN / ED COURSE  Pertinent labs & imaging results that were available during my care of the patient were reviewed by me and  considered in my medical decision making (see chart for details).  Patient is urinating without difficulty. Patient has no evidence of infection or obstruction. She is feeling better I will discharge her.    I will refill the patient's Lovenox she says she takes 80 mg twice a day.  ____________________________________________   FINAL CLINICAL IMPRESSION(S) / ED DIAGNOSES  Final diagnoses:  Pain  Acute left-sided back pain, unspecified back location      NEW MEDICATIONS STARTED DURING THIS VISIT:  New Prescriptions   No medications on file     Note:  This document was prepared using Dragon voice recognition software and may include unintentional dictation errors.    Nena Polio, MD 06/05/16 1926    Nena Polio, MD 06/05/16 [phone]

## 2016-06-05 NOTE — ED Notes (Signed)
Pt sts pain unchanged.  Latest BP 135/72.  Pt given 2 mg morphine

## 2016-06-06 LAB — URINE CULTURE: CULTURE: NO GROWTH

## 2016-10-10 ENCOUNTER — Other Ambulatory Visit: Payer: Self-pay | Admitting: Gastroenterology

## 2016-10-10 DIAGNOSIS — R945 Abnormal results of liver function studies: Principal | ICD-10-CM

## 2016-10-10 DIAGNOSIS — R7989 Other specified abnormal findings of blood chemistry: Secondary | ICD-10-CM

## 2016-10-12 ENCOUNTER — Other Ambulatory Visit
Admission: RE | Admit: 2016-10-12 | Discharge: 2016-10-12 | Disposition: A | Discharge status: Home / Self Care | Payer: Medicaid Other | Source: Ambulatory Visit | Attending: Gastroenterology | Admitting: Gastroenterology

## 2016-10-12 DIAGNOSIS — R197 Diarrhea, unspecified: Secondary | ICD-10-CM | POA: Insufficient documentation

## 2016-10-12 LAB — GASTROINTESTINAL PANEL BY PCR, STOOL (REPLACES STOOL CULTURE)
ADENOVIRUS F40/41: NOT DETECTED
ASTROVIRUS: NOT DETECTED
CAMPYLOBACTER SPECIES: NOT DETECTED
CRYPTOSPORIDIUM: NOT DETECTED
CYCLOSPORA CAYETANENSIS: NOT DETECTED
E. coli O157: NOT DETECTED
ENTEROAGGREGATIVE E COLI (EAEC): NOT DETECTED
ENTEROPATHOGENIC E COLI (EPEC): NOT DETECTED
ENTEROTOXIGENIC E COLI (ETEC): NOT DETECTED
Entamoeba histolytica: NOT DETECTED
GIARDIA LAMBLIA: NOT DETECTED
Norovirus GI/GII: NOT DETECTED
PLESIMONAS SHIGELLOIDES: NOT DETECTED
Rotavirus A: NOT DETECTED
Salmonella species: NOT DETECTED
Sapovirus (I, II, IV, and V): NOT DETECTED
Shiga like toxin producing E coli (STEC): NOT DETECTED
Shigella/Enteroinvasive E coli (EIEC): NOT DETECTED
VIBRIO SPECIES: NOT DETECTED
Vibrio cholerae: NOT DETECTED
YERSINIA ENTEROCOLITICA: NOT DETECTED

## 2016-10-12 LAB — C DIFFICILE QUICK SCREEN W PCR REFLEX
C Diff antigen: POSITIVE — AB
C Diff toxin: NEGATIVE

## 2016-10-12 LAB — CLOSTRIDIUM DIFFICILE BY PCR: CDIFFPCR: POSITIVE — AB

## 2016-10-18 ENCOUNTER — Other Ambulatory Visit: Payer: Self-pay | Admitting: Gastroenterology

## 2016-10-18 DIAGNOSIS — R7989 Other specified abnormal findings of blood chemistry: Secondary | ICD-10-CM

## 2016-10-18 DIAGNOSIS — R945 Abnormal results of liver function studies: Secondary | ICD-10-CM

## 2016-10-22 ENCOUNTER — Ambulatory Visit: Payer: Medicaid Other

## 2016-10-30 ENCOUNTER — Other Ambulatory Visit: Payer: Self-pay | Admitting: Gastroenterology

## 2016-10-30 DIAGNOSIS — R945 Abnormal results of liver function studies: Secondary | ICD-10-CM

## 2016-10-30 DIAGNOSIS — R7989 Other specified abnormal findings of blood chemistry: Secondary | ICD-10-CM

## 2016-10-31 ENCOUNTER — Ambulatory Visit
Admission: RE | Admit: 2016-10-31 | Discharge: 2016-10-31 | Disposition: A | Discharge status: Home / Self Care | Payer: Medicaid Other | Source: Ambulatory Visit | Attending: Gastroenterology | Admitting: Gastroenterology

## 2017-01-08 DIAGNOSIS — G8321 Monoplegia of upper limb affecting right dominant side: Secondary | ICD-10-CM | POA: Insufficient documentation

## 2017-01-11 ENCOUNTER — Ambulatory Visit: Payer: Medicaid Other

## 2017-03-06 ENCOUNTER — Ambulatory Visit: Admit: 2017-03-06 | Payer: Self-pay | Admitting: Gastroenterology

## 2017-03-06 SURGERY — ESOPHAGOGASTRODUODENOSCOPY (EGD) WITH PROPOFOL
Anesthesia: General

## 2017-04-21 ENCOUNTER — Other Ambulatory Visit: Payer: Self-pay | Admitting: Gastroenterology

## 2017-04-21 DIAGNOSIS — K838 Other specified diseases of biliary tract: Secondary | ICD-10-CM

## 2017-04-23 ENCOUNTER — Other Ambulatory Visit
Admission: RE | Admit: 2017-04-23 | Discharge: 2017-04-23 | Disposition: A | Discharge status: Home / Self Care | Payer: Medicaid Other | Source: Ambulatory Visit | Attending: Gastroenterology | Admitting: Gastroenterology

## 2017-04-23 DIAGNOSIS — R1084 Generalized abdominal pain: Secondary | ICD-10-CM | POA: Insufficient documentation

## 2017-04-23 DIAGNOSIS — K529 Noninfective gastroenteritis and colitis, unspecified: Secondary | ICD-10-CM | POA: Insufficient documentation

## 2017-04-23 LAB — C DIFFICILE QUICK SCREEN W PCR REFLEX
C DIFFICILE (CDIFF) INTERP: NOT DETECTED
C DIFFICILE (CDIFF) TOXIN: NEGATIVE
C Diff antigen: NEGATIVE

## 2017-04-24 LAB — GASTROINTESTINAL PANEL BY PCR, STOOL (REPLACES STOOL CULTURE)
Adenovirus F40/41: NOT DETECTED
Astrovirus: NOT DETECTED
Campylobacter species: NOT DETECTED
Cryptosporidium: NOT DETECTED
Cyclospora cayetanensis: NOT DETECTED
ENTEROAGGREGATIVE E COLI (EAEC): NOT DETECTED
ENTEROTOXIGENIC E COLI (ETEC): NOT DETECTED
Entamoeba histolytica: NOT DETECTED
Enteropathogenic E coli (EPEC): NOT DETECTED
GIARDIA LAMBLIA: NOT DETECTED
NOROVIRUS GI/GII: NOT DETECTED
Plesimonas shigelloides: NOT DETECTED
ROTAVIRUS A: NOT DETECTED
SALMONELLA SPECIES: NOT DETECTED
SHIGA LIKE TOXIN PRODUCING E COLI (STEC): NOT DETECTED
SHIGELLA/ENTEROINVASIVE E COLI (EIEC): NOT DETECTED
Sapovirus (I, II, IV, and V): NOT DETECTED
Vibrio cholerae: NOT DETECTED
Vibrio species: NOT DETECTED
Yersinia enterocolitica: NOT DETECTED

## 2017-04-28 ENCOUNTER — Ambulatory Visit
Admission: RE | Admit: 2017-04-28 | Discharge: 2017-04-28 | Disposition: A | Discharge status: Home / Self Care | Payer: Medicaid Other | Source: Ambulatory Visit | Attending: Gastroenterology | Admitting: Gastroenterology

## 2017-06-23 ENCOUNTER — Encounter: Admission: RE | Payer: Self-pay | Source: Ambulatory Visit

## 2017-06-23 ENCOUNTER — Ambulatory Visit: Admission: RE | Admit: 2017-06-23 | Payer: Medicaid Other | Source: Ambulatory Visit | Admitting: Gastroenterology

## 2017-06-23 SURGERY — COLONOSCOPY WITH PROPOFOL
Anesthesia: General

## 2017-07-11 ENCOUNTER — Other Ambulatory Visit: Payer: Self-pay

## 2017-07-11 ENCOUNTER — Emergency Department
Admission: EM | Admit: 2017-07-11 | Discharge: 2017-07-13 | Disposition: A | Discharge status: Home / Self Care | Payer: Medicaid Other | Attending: Emergency Medicine | Admitting: Emergency Medicine

## 2017-07-11 ENCOUNTER — Emergency Department: Payer: Medicaid Other

## 2017-07-11 ENCOUNTER — Encounter: Payer: Self-pay | Admitting: Emergency Medicine

## 2017-07-11 DIAGNOSIS — I509 Heart failure, unspecified: Secondary | ICD-10-CM | POA: Diagnosis not present

## 2017-07-11 DIAGNOSIS — T404X4A Poisoning by other synthetic narcotics, undetermined, initial encounter: Secondary | ICD-10-CM | POA: Insufficient documentation

## 2017-07-11 DIAGNOSIS — Z046 Encounter for general psychiatric examination, requested by authority: Secondary | ICD-10-CM | POA: Insufficient documentation

## 2017-07-11 DIAGNOSIS — W109XXA Fall (on) (from) unspecified stairs and steps, initial encounter: Secondary | ICD-10-CM | POA: Insufficient documentation

## 2017-07-11 DIAGNOSIS — Y939 Activity, unspecified: Secondary | ICD-10-CM | POA: Insufficient documentation

## 2017-07-11 DIAGNOSIS — M79641 Pain in right hand: Secondary | ICD-10-CM | POA: Insufficient documentation

## 2017-07-11 DIAGNOSIS — S92301A Fracture of unspecified metatarsal bone(s), right foot, initial encounter for closed fracture: Secondary | ICD-10-CM | POA: Diagnosis not present

## 2017-07-11 DIAGNOSIS — J45909 Unspecified asthma, uncomplicated: Secondary | ICD-10-CM | POA: Insufficient documentation

## 2017-07-11 DIAGNOSIS — R4182 Altered mental status, unspecified: Secondary | ICD-10-CM | POA: Diagnosis present

## 2017-07-11 DIAGNOSIS — Z91040 Latex allergy status: Secondary | ICD-10-CM | POA: Insufficient documentation

## 2017-07-11 DIAGNOSIS — T50904A Poisoning by unspecified drugs, medicaments and biological substances, undetermined, initial encounter: Secondary | ICD-10-CM

## 2017-07-11 DIAGNOSIS — Y999 Unspecified external cause status: Secondary | ICD-10-CM | POA: Diagnosis not present

## 2017-07-11 DIAGNOSIS — Z79899 Other long term (current) drug therapy: Secondary | ICD-10-CM | POA: Insufficient documentation

## 2017-07-11 DIAGNOSIS — Y929 Unspecified place or not applicable: Secondary | ICD-10-CM | POA: Diagnosis not present

## 2017-07-11 LAB — CBC
HCT: 32.5 % — ABNORMAL LOW (ref 35.0–47.0)
HEMOGLOBIN: 10.7 g/dL — AB (ref 12.0–16.0)
MCH: 28.1 pg (ref 26.0–34.0)
MCHC: 32.7 g/dL (ref 32.0–36.0)
MCV: 85.9 fL (ref 80.0–100.0)
Platelets: 123 10*3/uL — ABNORMAL LOW (ref 150–440)
RBC: 3.79 MIL/uL — ABNORMAL LOW (ref 3.80–5.20)
RDW: 16.1 % — ABNORMAL HIGH (ref 11.5–14.5)
WBC: 3 10*3/uL — ABNORMAL LOW (ref 3.6–11.0)

## 2017-07-11 LAB — ACETAMINOPHEN LEVEL: Acetaminophen (Tylenol), Serum: <10 ug/mL — ABNORMAL LOW (ref 10–30)

## 2017-07-11 LAB — COMPREHENSIVE METABOLIC PANEL
ALK PHOS: 163 U/L — AB (ref 38–126)
ALT: 35 U/L (ref 14–54)
ANION GAP: 4 — AB (ref 5–15)
AST: 32 U/L (ref 15–41)
Albumin: 3.9 g/dL (ref 3.5–5.0)
BUN: 11 mg/dL (ref 6–20)
CALCIUM: 8.9 mg/dL (ref 8.9–10.3)
CO2: 29 mmol/L (ref 22–32)
Chloride: 107 mmol/L (ref 101–111)
Creatinine, Ser: 0.89 mg/dL (ref 0.44–1.00)
GFR calc non Af Amer: >60 mL/min (ref >60)
Glucose, Bld: 92 mg/dL (ref 65–99)
Potassium: 3.5 mmol/L (ref 3.5–5.1)
SODIUM: 140 mmol/L (ref 135–145)
Total Bilirubin: 0.5 mg/dL (ref 0.3–1.2)
Total Protein: 6.7 g/dL (ref 6.5–8.1)

## 2017-07-11 LAB — TROPONIN I

## 2017-07-11 LAB — URINE DRUG SCREEN, QUALITATIVE (ARMC ONLY)
Amphetamines, Ur Screen: POSITIVE — AB
BARBITURATES, UR SCREEN: NOT DETECTED
Benzodiazepine, Ur Scrn: NOT DETECTED
CANNABINOID 50 NG, UR ~~LOC~~: NOT DETECTED
COCAINE METABOLITE, UR ~~LOC~~: NOT DETECTED
MDMA (Ecstasy)Ur Screen: NOT DETECTED
Methadone Scn, Ur: POSITIVE — AB
Opiate, Ur Screen: POSITIVE — AB
Phencyclidine (PCP) Ur S: NOT DETECTED
TRICYCLIC, UR SCREEN: NOT DETECTED

## 2017-07-11 LAB — ETHANOL

## 2017-07-11 LAB — SALICYLATE LEVEL

## 2017-07-11 MED ORDER — SODIUM CHLORIDE 0.9 % IV BOLUS
500.0000 mL | Freq: Once | INTRAVENOUS | Status: AC
  Administered 2017-07-11: 500 mL via INTRAVENOUS

## 2017-07-11 MED ORDER — IBUPROFEN 600 MG PO TABS
600.0000 mg | ORAL_TABLET | Freq: Once | ORAL | Status: AC
  Administered 2017-07-11: 600 mg via ORAL
  Filled 2017-07-11: qty 1

## 2017-07-11 NOTE — ED Notes (Signed)
This EDT informed pt of the warning signs to look for that could potentially happen from the splinting application.   Pt verbalized understanding. Pt stated that the splint initially felt "tight" and this EDT unloosened the ace and re-wrapped the patients foot. Pt now stating that the splint feels a lot better and is not tight. Pt taught what to do if the splint feels too tight again. Pt cap refill WNL. Pt now expressing that she is in pain and would like something to help it. This EDT informed the RN and MD taking care of this pt. Nothing needed from this EDT at this time

## 2017-07-11 NOTE — ED Notes (Signed)
Pt stating that she used her medication to help herself get out of pain. Pt stating that she hurt her right ankle and stating that she took her meds to help for comfort. Pt was not evaluated for right ankle fx. Right ankle has minimal edema noted. Pt is drowsy in appearance at this time but easily arouses. Pt VS are stable at this time. Pt denies any attempt of harming herself or trying to.

## 2017-07-11 NOTE — ED Notes (Addendum)
Nurse spoke with poision control. Levada Dy from poison control recommending 12 hours observation, Benzo PRN, IV fluids and continuous cardiac monitoring. Dr. Jacqualine Code will be advised.

## 2017-07-11 NOTE — ED Provider Notes (Signed)
[facility] Emergency Department Provider Note   ____________________________________________   First MD Initiated Contact with Patient 07/11/17 1549     (approximate)  I have reviewed the triage vital signs and the nursing notes.   HISTORY  Chief Complaint Drug Overdose and Altered Mental Status  EM caveat: Confusion, some somnolence  HPI Leslie Marks is a 47 y.o. female history of congestive heart failure, chronic anxiety, DVTs, hypoglycemia prior PE.  Also prior history of overdose.  Patient reports to me, and should be noted that she is rather somnolent, but she reports that she received prescriptions for her Nucynta pain medicine and Ambien, she took several extra over the last day she has been having chronic pain.  She reports she did intentionally.  She is not clear on the intent, she does not overtly mention any suicidal ideation.  She does appear slightly confused, slow in cognition with slight slurring of her speech.  She reports she has chronic pain in her right arm from a previous surgery in the right hand.  She denies any other pain except for this chronic pain she experiences.   Past Medical History:  Diagnosis Date  . Asthma   . B12 deficiency   . Benzodiazepine dependence ([place])   . CHF (congestive heart failure) ([place])   . Chronic abdominal pain   . Chronic anticoagulation   . Chronic anxiety   . Chronic pain syndrome   . Collagen vascular disease ([place])   . Depression   . DVT (deep venous thrombosis) ([place])   . Encephalopathy   . Fibromyalgia   . GERD (gastroesophageal reflux disease)   . Hypoglycemia   . Iron deficiency anemia   . Lumbago   . Migraine   . Non-diabetic hypoglycemia   . Opiate dependence ([place])   . Overdose   . Pulmonary emboli ([place]) 2007  . Vitamin D deficiency     Patient Active Problem List   Diagnosis Date Noted  . Opiate abuse, episodic ([place]) 04/23/2016  . Opiate overdose ([place]) 03/12/2016  .  Generalized abdominal pain   . Benzodiazepine overdose 09/30/2014  . Encephalopathy 09/30/2014  . 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 Malabsorption syndrome 05/27/2013  . Cholelithiasis 05/12/2013  . Lupus anticoagulant positive 04/29/2013  . 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 Altered mental status 04/27/2013  . Pulmonary emboli ([place]) 04/27/2013  . Chronic pain syndrome 04/27/2013  . Chronic anxiety 04/27/2013  . Non-diabetic hypoglycemia 04/27/2013  . Acute encephalopathy 04/26/2013  . Depression 04/26/2013  . Chronic anticoagulation 04/26/2013  . Pancytopenia ([place]) 04/26/2013    Past Surgical History:  Procedure Laterality Date  . ABDOMINAL HYSTERECTOMY    . APPENDECTOMY    . CESAREAN SECTION    . CHOLECYSTECTOMY    . GASTRIC BYPASS  2003  . TONSILLECTOMY      Prior to Admission medications   Medication Sig Start Date End Date Taking? Authorizing Provider  albuterol (PROVENTIL HFA;VENTOLIN HFA) 108 (90 Base) MCG/ACT inhaler Inhale 2 puffs into the lungs every 4 (four) hours as needed for wheezing or shortness of breath.    [provider]  gabapentin (NEURONTIN) 600 MG tablet Take 1,200 mg by mouth 3 (three) times daily.     [provider]  naloxone Karma Greaser) 0.4 MG/ML injection To be used as needed for overdose 05/30/16   Earleen Newport, MD  OLANZapine (ZYPREXA) 20 MG tablet Take 40 mg by mouth at  bedtime.     [provider]  ondansetron (ZOFRAN) 8 MG tablet Take 8 mg by mouth 3 (three) times daily.    [provider]  Oxycodone HCl 10 MG TABS Take 10 mg by mouth every 6 (six) hours. rx was written for q 5-6 h with max of 5 tabs qd    [provider]  propranolol (INDERAL) 40 MG tablet Take 40 mg by mouth 3 (three) times daily.    [provider]  Tapentadol HCl 200 MG TB12 Take 1 tablet by mouth 2 (two) times daily.    [provider]  tiZANidine  (ZANAFLEX) 4 MG tablet Take 4 mg by mouth 2 (two) times daily. Per pharmacy, rx was written for 1 bid    [provider]  topiramate (TOPAMAX) 100 MG tablet Take 100 mg by mouth daily.     [provider]  traZODone (DESYREL) 150 MG tablet Take 300 mg by mouth at bedtime.    [provider]  zolpidem (AMBIEN) 10 MG tablet Take 5-10 mg by mouth at bedtime as needed for sleep.     [provider]    Allergies Amoxicillin; Augmentin [amoxicillin-pot clavulanate]; Betadine [povidone iodine]; Ciprofloxacin; Erythromycin; Latex; Penicillins; Adhesive [tape]; and Lyrica [pregabalin]  Family History  Problem Relation Age of Onset  . Heart failure Neg Hx     Social History Social History   Tobacco Use  . Smoking status: Never Smoker  . Smokeless tobacco: Never Used  Substance Use Topics  . Alcohol use: No  . Drug use: No    Review of Systems  EM caveat: Patient does report she is not having a headache, denies chest pain or trouble breathing.  Reports she has pain in the right arm from prior joint fusion in the hand    ____________________________________________   PHYSICAL EXAM:  VITAL SIGNS: ED Triage Vitals  Enc Vitals Group     BP 07/11/17 1605 (!) 101/52     Pulse Rate 07/11/17 1605 70     Resp 07/11/17 1605 16     Temp 07/11/17 1605 98.3 F (36.8 C)     Temp Source 07/11/17 1605 Oral     SpO2 07/11/17 1605 98 %     Weight 07/11/17 1553 175 lb (79.4 kg)     Height --      Head Circumference --      Peak Flow --      Pain Score 07/11/17 1552 5     Pain Loc --      Pain Edu? --      Excl. in [place]? --     Constitutional: Somnolent but oriented to place and time.  Eyes: Conjunctivae are normal. Head: Atraumatic. Nose: No congestion/rhinnorhea. Mouth/Throat: Mucous membranes are slightly dry. Neck: No stridor.   Cardiovascular: Normal rate, regular rhythm. Grossly normal heart sounds.  Good peripheral circulation. Respiratory:  Normal respiratory effort.  No retractions. Lungs CTAB.  Even and unlabored. Gastrointestinal: Soft and nontender. No distention. Musculoskeletal: No lower extremity tenderness nor edema.  Moves left upper extremity well.  Right hand some limitation of movement at the right hand with old scarring.  No focal deficits are noted. Neurologic: Slightly slurred speech and language. No gross focal neurologic deficits are appreciated.  Skin:  Skin is warm, dry and intact. No rash noted. Psychiatric: Mood and affect are flat.  Somnolent.  Denies overt suicidal ideation but also reports that this overdose was intentional.   ____________________________________________  LABS (all labs ordered are listed, but only abnormal results are displayed)  Labs Reviewed  URINE DRUG SCREEN, QUALITATIVE ([facility] ONLY) - Abnormal; Notable for the following components:      Result Value   Amphetamines, Ur Screen POSITIVE (*)    Opiate, Ur Screen POSITIVE (*)    Methadone Scn, Ur POSITIVE (*)    All other components within normal limits  ACETAMINOPHEN LEVEL - Abnormal; Notable for the following components:   Acetaminophen (Tylenol), Serum <10 (*)    All other components within normal limits  CBC - Abnormal; Notable for the following components:   WBC 3.0 (*)    RBC 3.79 (*)    Hemoglobin 10.7 (*)    HCT 32.5 (*)    RDW 16.1 (*)    Platelets 123 (*)    All other components within normal limits  COMPREHENSIVE METABOLIC PANEL - Abnormal; Notable for the following components:   Alkaline Phosphatase 163 (*)    Anion gap 4 (*)    All other components within normal limits  ETHANOL  SALICYLATE LEVEL  TROPONIN I  CBG MONITORING, ED   ____________________________________________  EKG  Reviewed enterotomy at 1600 Heart rate 79 cures 100 QTc 440 Normal sinus rhythm no evidence of ischemia.  No prolongation of the QT.  ____________________________________________  RADIOLOGY   CT head reviewed negative for  acute Right foot demonstrates mildly displaced fractures of the third and fourth metatarsals.  Reviewed by me   ____________________________________________   PROCEDURES  Procedure(s) performed: None  Procedures  Critical Care performed: No  ____________________________________________   INITIAL IMPRESSION / ASSESSMENT AND PLAN / ED COURSE  Pertinent labs & imaging results that were available during my care of the patient were reviewed by me and considered in my medical decision making (see chart for details).  Patient presents rather somnolent, but protecting her airway well in no distress.  Some mild hypotension with EMS, also mild hypotension in the ER.  Will initiate fluids.  Continue to monitor her very carefully, at the present time she does not appear to have any acute cardio respiratory compromise.  Some slight slurring of her speech is present, and I suspect likely she has overdosed with potentially her Ambien and pain medication as she describes, will send a overdose work-up.  EKG.  Continue to monitor the patient carefully on telemetry.  No signs or symptoms suggest as an acute stroke, or other clinical history and history presented seem to suggest that of an intentional overdose with somewhat unclear intent as to whether it was to the self-harm or medicate for chronic pain.  Clinical Course as of Jul 12 1955  Fri Jul 11, 2017  1738 Spoke with pain medicine clinic, Dr. Alla German.  She reports that the patient has been prescribed both Nucynta 200 mg extended release as well as 50 mg short release and had access to roughly 60 tablets starting 4 days ago.  Dr. Magdalene Molly from [facility] pain medicine specialist would recommend that the patient be observed for 24 hours due to the long-acting effect of Nucynta ER in combination with potentially trazodone and Ambien and Zanaflex which she is also been prescribed.   [MQ]  2094 Patient alertness improved.  She is resting comfortably, reports  ongoing pain in her right hand.  Lab work beginning to result.   [MQ]    Clinical Course User Index [MQ] Delman Kitten, MD   Poison control (called by RN) recommends a 12 hour observation on cardiac monitor until  patient can be medically cleared.  510PM: Have paged [facility] pain medicine to discuss presentation and review her current outpatient medications as the patient is reporting a self initiated overdose.  Patient's lab work is additionally been delayed due to difficult vascular access, nursing and IV team attempting at present.  I have ordered point-of-care glucose.  ----------------------------------------- 7:55 PM on 07/11/2017 -----------------------------------------  Patient is now fully alert and oriented.  Vital signs stabilized.  She appears much improved now.  Her mental status much better, will follow poison control recommendations to observe for about 12 hours as she appears to be much improved.  Now fully oriented, alert eating, in no distress.  She does report ongoing pain in her right foot, and tells me that she fell on steps onto her right foot earlier, and that she been having pain there today which is why she was taking extra medication in addition to her chronic right hand pain.  She denies suicidal ideation, reports she was medicating for pain control, however review of her chart denotes multiple overdoses, I certainly would not prescribe her any added narcotics.  Place consult to tele-psychiatry.  Plan to observe the patient approximately 5 AM, which time if mental status normal vital signs stable and she continues to do well plan will be to discharge with follow-up with orthopedics as well as her primary care and pain control physicians.  ----------------------------------------- 12:01 AM on 07/12/2017 -----------------------------------------  Ongoing care and disposition assigned to Dr. Mable Paris.  Patient being observed for medical clearance due to overdose anticipated  to about 5 or 6 in the morning.  Also pending tele-psych consult.  Under IVC. ____________________________________________   FINAL CLINICAL IMPRESSION(S) / ED DIAGNOSES  Final diagnoses:  Closed displaced fracture of metatarsal bone of right foot, unspecified metatarsal, initial encounter  Polysubstance overdose, undetermined intent, initial encounter      NEW MEDICATIONS STARTED DURING THIS VISIT:  New Prescriptions   No medications on file     Note:  This document was prepared using Dragon voice recognition software and may include unintentional dictation errors.     Delman Kitten, MD 07/12/17 Dyann Kief

## 2017-07-11 NOTE — ED Notes (Signed)
Nurse attempted 2 PIV attempts. Tammy, NT attempted two attempts. IV team consulted

## 2017-07-11 NOTE — ED Notes (Signed)
Pt was assisted to the restroom by this EDT

## 2017-07-11 NOTE — ED Notes (Signed)
Patient transported to CT 

## 2017-07-11 NOTE — ED Notes (Signed)
IVC 

## 2017-07-11 NOTE — ED Notes (Signed)
Call from [employer]. Information given. No further recommendations at this time

## 2017-07-11 NOTE — ED Triage Notes (Signed)
Pt to ED via EMS from home and with [place] today, EMS states multiple drug bottles recently filled and empty consisting of ambien, muscle relaxer, pain medication.  Patient states trying to relieve pain, denies SI/HI.  Presents drowsy but alert and oriented to self and place.  MD at bedside.

## 2017-07-12 NOTE — ED Notes (Signed)
Pt attempted to call mobile crisis unit to help with transport home. She has no resources or phone numbers and no way to get home. Discussed with Charge Nurse and pt discharged to lobby. Spoke with First nurse for additional ideas. Pt will wait and will speak to oncoming charge nurse if pt still unable to find transportation home

## 2017-07-12 NOTE — ED Provider Notes (Signed)
Psychiatry reversed involuntary commitment.  She has been observed 12 hours with no adverse events.  Stable for discharge.   Darel Hong, MD 07/12/17 [phone]

## 2017-07-12 NOTE — ED Notes (Signed)
Set up for [facility]

## 2017-11-02 ENCOUNTER — Other Ambulatory Visit: Payer: Self-pay

## 2017-11-02 ENCOUNTER — Emergency Department
Admission: EM | Admit: 2017-11-02 | Discharge: 2017-11-02 | Disposition: A | Discharge status: Home / Self Care | Payer: Medicaid Other | Attending: Emergency Medicine | Admitting: Emergency Medicine

## 2017-11-02 DIAGNOSIS — Z9884 Bariatric surgery status: Secondary | ICD-10-CM | POA: Diagnosis not present

## 2017-11-02 DIAGNOSIS — M5416 Radiculopathy, lumbar region: Secondary | ICD-10-CM | POA: Diagnosis not present

## 2017-11-02 DIAGNOSIS — Z79899 Other long term (current) drug therapy: Secondary | ICD-10-CM | POA: Diagnosis not present

## 2017-11-02 DIAGNOSIS — M545 Low back pain: Secondary | ICD-10-CM

## 2017-11-02 DIAGNOSIS — J45909 Unspecified asthma, uncomplicated: Secondary | ICD-10-CM | POA: Insufficient documentation

## 2017-11-02 DIAGNOSIS — G8929 Other chronic pain: Secondary | ICD-10-CM | POA: Diagnosis not present

## 2017-11-02 DIAGNOSIS — Z91040 Latex allergy status: Secondary | ICD-10-CM | POA: Insufficient documentation

## 2017-11-02 DIAGNOSIS — R200 Anesthesia of skin: Secondary | ICD-10-CM | POA: Insufficient documentation

## 2017-11-02 DIAGNOSIS — I509 Heart failure, unspecified: Secondary | ICD-10-CM | POA: Insufficient documentation

## 2017-11-02 DIAGNOSIS — R202 Paresthesia of skin: Secondary | ICD-10-CM | POA: Insufficient documentation

## 2017-11-02 MED ORDER — METHYLPREDNISOLONE 4 MG PO TBPK: ORAL_TABLET | ORAL | 0 refills | Status: DC

## 2017-11-02 MED ORDER — CYCLOBENZAPRINE HCL 10 MG PO TABS
5.0000 mg | ORAL_TABLET | Freq: Once | ORAL | Status: AC
  Administered 2017-11-02: 5 mg via ORAL
  Filled 2017-11-02: qty 1

## 2017-11-02 MED ORDER — PREDNISONE 20 MG PO TABS
60.0000 mg | ORAL_TABLET | Freq: Once | ORAL | Status: AC
  Administered 2017-11-02: 60 mg via ORAL
  Filled 2017-11-02: qty 3

## 2017-11-02 NOTE — ED Triage Notes (Signed)
Pt arrives via ems from home. Ems states pt c/o back pain, with hx of herniated disks. Pt states took muscle relaxer around 10am with no relief. Pt a&o on arrival with no acute distress noted. Ems reports bp of 96/58.

## 2017-11-02 NOTE — ED Notes (Signed)
Pt given ginger ale per request

## 2017-11-02 NOTE — ED Provider Notes (Signed)
[facility] Emergency Department Provider Note  ____________________________________________   First MD Initiated Contact with Patient 11/02/17 1824     (approximate)  I have reviewed the triage vital signs and the nursing notes.   HISTORY  Chief Complaint Back Pain   HPI Leslie Marks is a 47 y.o. female with a history of chronic back pain as well CVA with residual right-sided weakness was presented emergency department low back pain.  Says the pain is moderate to severe and most concentrated in the right lower back.  However, she says that she has numbness and tingling to the bilateral lower extremities and says that she has had weakness with her gait that is been worsening since this past Thursday.  She says that her knee is tired after just a couple steps.  She says that she has been taking Zanaflex but that it does not seem to be working.  She also says that she lost bladder continence x1.  Says that she has been having back pain is a chronic issue and has been seen in pain clinic.  However, has not followed up with a neurosurgeon.   Past Medical History:  Diagnosis Date  . Asthma   . B12 deficiency   . Benzodiazepine dependence ([place])   . CHF (congestive heart failure) ([place])   . Chronic abdominal pain   . Chronic anticoagulation   . Chronic anxiety   . Chronic pain syndrome   . Collagen vascular disease ([place])   . Depression   . DVT (deep venous thrombosis) ([place])   . Encephalopathy   . Fibromyalgia   . GERD (gastroesophageal reflux disease)   . Hypoglycemia   . Iron deficiency anemia   . Lumbago   . Migraine   . Non-diabetic hypoglycemia   . Opiate dependence ([place])   . Overdose   . Pulmonary emboli ([place]) 2007  . Vitamin D deficiency     Patient Active Problem List   Diagnosis Date Noted  . Opiate abuse, episodic ([place]) 04/23/2016  . Opiate overdose ([place]) 03/12/2016  . Generalized abdominal pain   . Benzodiazepine overdose 09/30/2014    . Encephalopathy 09/30/2014  . 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 Malabsorption syndrome 05/27/2013  . Cholelithiasis 05/12/2013  . Lupus anticoagulant positive 04/29/2013  . 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 Altered mental status 04/27/2013  . Pulmonary emboli ([place]) 04/27/2013  . Chronic pain syndrome 04/27/2013  . Chronic anxiety 04/27/2013  . Non-diabetic hypoglycemia 04/27/2013  . Acute encephalopathy 04/26/2013  . Depression 04/26/2013  . Chronic anticoagulation 04/26/2013  . Pancytopenia ([place]) 04/26/2013    Past Surgical History:  Procedure Laterality Date  . ABDOMINAL HYSTERECTOMY    . APPENDECTOMY    . CESAREAN SECTION    . CHOLECYSTECTOMY    . GASTRIC BYPASS  2003  . TONSILLECTOMY      Prior to Admission medications   Medication Sig Start Date End Date Taking? Authorizing Provider  albuterol (PROVENTIL HFA;VENTOLIN HFA) 108 (90 Base) MCG/ACT inhaler Inhale 2 puffs into the lungs every 4 (four) hours as needed for wheezing or shortness of breath.    [provider]  gabapentin (NEURONTIN) 600 MG tablet Take 1,200 mg by mouth 3 (three) times daily.     [provider]  naloxone Karma Greaser) 0.4 MG/ML injection To be used as needed for overdose 05/30/16   Earleen Newport, MD  OLANZapine (ZYPREXA) 20 MG tablet Take 40 mg  by mouth at bedtime.     [provider]  ondansetron (ZOFRAN) 8 MG tablet Take 8 mg by mouth 3 (three) times daily.    [provider]  Oxycodone HCl 10 MG TABS Take 10 mg by mouth every 6 (six) hours. rx was written for q 5-6 h with max of 5 tabs qd    [provider]  propranolol (INDERAL) 40 MG tablet Take 40 mg by mouth 3 (three) times daily.    [provider]  Tapentadol HCl 200 MG TB12 Take 1 tablet by mouth 2 (two) times daily.    [provider]  tiZANidine (ZANAFLEX) 4 MG tablet Take 4 mg by mouth 2 (two) times daily. Per  pharmacy, rx was written for 1 bid    [provider]  topiramate (TOPAMAX) 100 MG tablet Take 100 mg by mouth daily.     [provider]  traZODone (DESYREL) 150 MG tablet Take 300 mg by mouth at bedtime.    [provider]  zolpidem (AMBIEN) 10 MG tablet Take 5-10 mg by mouth at bedtime as needed for sleep.     [provider]    Allergies Amoxicillin; Augmentin [amoxicillin-pot clavulanate]; Betadine [povidone iodine]; Ciprofloxacin; Erythromycin; Latex; Penicillins; Adhesive [tape]; and Lyrica [pregabalin]  Family History  Problem Relation Age of Onset  . Heart failure Neg Hx     Social History Social History   Tobacco Use  . Smoking status: Never Smoker  . Smokeless tobacco: Never Used  Substance Use Topics  . Alcohol use: No  . Drug use: No    Review of Systems  Constitutional: No fever/chills Eyes: No visual changes. ENT: No sore throat. Cardiovascular: Denies chest pain. Respiratory: Denies shortness of breath. Gastrointestinal: No abdominal pain.  No nausea, no vomiting.  No diarrhea.  No constipation. Genitourinary: As above Musculoskeletal: As above Skin: Negative for rash. Neurological: Negative for headaches, focal weakness or numbness.   ____________________________________________   PHYSICAL EXAM:  VITAL SIGNS: ED Triage Vitals  Enc Vitals Group     BP 11/02/17 1831 108/71     Pulse Rate 11/02/17 1831 77     Resp --      Temp 11/02/17 1831 98.4 F (36.9 C)     Temp Source 11/02/17 1831 Oral     SpO2 11/02/17 1819 98 %     Weight 11/02/17 1823 189 lb (85.7 kg)     Height 11/02/17 1823 5' 5.5" (1.664 m)     Head Circumference --      Peak Flow --      Pain Score 11/02/17 1823 9     Pain Loc --      Pain Edu? --      Excl. in [place]? --     Constitutional: Alert and oriented. Well appearing and in no acute distress. Eyes: Conjunctivae are normal.  Head: Atraumatic. Nose: No  congestion/rhinnorhea. Mouth/Throat: Mucous membranes are moist.  Neck: No stridor.   Cardiovascular: Normal rate, regular rhythm. Grossly normal heart sounds.  Good peripheral circulation with equal and bilateral dorsalis pedis pulses. Respiratory: Normal respiratory effort.  No retractions. Lungs CTAB. Gastrointestinal: Soft and nontender. No distention. No CVA tenderness. Musculoskeletal: No lower extremity tenderness nor edema.  No joint effusions.  5 out of 5 strength in bilateral lower extremities.  Moderate tenderness palpation of the right lateral lumbar region without any deformity or step-off.  No rash visualized.  Negative for saddle anesthesia.  The patient is sensate to  light touch diffusely to the bilateral lower extremities.  Negative straight leg raise bilaterally.  Neurologic:  Normal speech and language.  Right upper extremity weakness which patient says is baseline for her status post her CVA.    Skin:  Skin is warm, dry and intact. No rash noted. Psychiatric: Mood and affect are normal. Speech and behavior are normal.  ____________________________________________   LABS (all labs ordered are listed, but only abnormal results are displayed)  Labs Reviewed - No data to display ____________________________________________  EKG   ____________________________________________  RADIOLOGY   ____________________________________________   PROCEDURES  Procedure(s) performed:   Procedures  Critical Care performed:   ____________________________________________   INITIAL IMPRESSION / ASSESSMENT AND PLAN / ED COURSE  Pertinent labs & imaging results that were available during my care of the patient were reviewed by me and considered in my medical decision making (see chart for details).  DDX: Herniated disc, cauda equina, chronic back pain, radiculopathy, sciatica As part of my medical decision making, I reviewed the following data within the [place] Notes from prior ED visits  ----------------------------------------- 7:54 PM on 11/02/2017 -----------------------------------------  After prednisone and Flexeril the patient is able to ambulate with a normal gait.  She does not appear weak at the knees and I do not see any buckling of her knees.  She walks unassisted.  I have reservations about prescribing her any sedatives as the patient appears to have opiate dependence listed on her medical history in addition to this being a chronic issue.  She will be discharged with a Medrol Dosepak.  I do not believe she has cauda equina.  She said that she had one episode of urinary incontinence but otherwise I do not see any objective findings on my exam to indicate neurologic compromise.  Patient will be discharged at this time.  She will be given follow-up with neurosurgery.   FINAL CLINICAL IMPRESSION(S) / ED DIAGNOSES  Chronic back pain.  Lumbar radiculopathy.  NEW MEDICATIONS STARTED DURING THIS VISIT:  New Prescriptions   No medications on file     Note:  This document was prepared using Dragon voice recognition software and may include unintentional dictation errors.     Orbie Pyo, MD 11/02/17 [phone]

## 2017-12-23 ENCOUNTER — Emergency Department
Admission: EM | Admit: 2017-12-23 | Discharge: 2017-12-24 | Disposition: A | Discharge status: Home / Self Care | Payer: Medicaid Other | Attending: Emergency Medicine | Admitting: Emergency Medicine

## 2017-12-23 ENCOUNTER — Encounter: Payer: Self-pay | Admitting: *Deleted

## 2017-12-23 ENCOUNTER — Emergency Department: Payer: Medicaid Other

## 2017-12-23 ENCOUNTER — Other Ambulatory Visit: Payer: Self-pay

## 2017-12-23 DIAGNOSIS — J45909 Unspecified asthma, uncomplicated: Secondary | ICD-10-CM | POA: Insufficient documentation

## 2017-12-23 DIAGNOSIS — Z79899 Other long term (current) drug therapy: Secondary | ICD-10-CM | POA: Insufficient documentation

## 2017-12-23 DIAGNOSIS — T507X1A Poisoning by analeptics and opioid receptor antagonists, accidental (unintentional), initial encounter: Secondary | ICD-10-CM | POA: Insufficient documentation

## 2017-12-23 DIAGNOSIS — R1084 Generalized abdominal pain: Secondary | ICD-10-CM | POA: Insufficient documentation

## 2017-12-23 DIAGNOSIS — T40601A Poisoning by unspecified narcotics, accidental (unintentional), initial encounter: Secondary | ICD-10-CM

## 2017-12-23 DIAGNOSIS — G8929 Other chronic pain: Secondary | ICD-10-CM | POA: Diagnosis not present

## 2017-12-23 LAB — COMPREHENSIVE METABOLIC PANEL
ALT: 70 U/L — ABNORMAL HIGH (ref 0–44)
AST: 146 U/L — ABNORMAL HIGH (ref 15–41)
Albumin: 4 g/dL (ref 3.5–5.0)
Alkaline Phosphatase: 158 U/L — ABNORMAL HIGH (ref 38–126)
Anion gap: 7 (ref 5–15)
BUN: 18 mg/dL (ref 6–20)
CHLORIDE: 110 mmol/L (ref 98–111)
CO2: 25 mmol/L (ref 22–32)
Calcium: 9 mg/dL (ref 8.9–10.3)
Creatinine, Ser: 1.04 mg/dL — ABNORMAL HIGH (ref 0.44–1.00)
GFR calc non Af Amer: >60 mL/min (ref >60)
GLUCOSE: 98 mg/dL (ref 70–99)
POTASSIUM: 4.1 mmol/L (ref 3.5–5.1)
SODIUM: 142 mmol/L (ref 135–145)
Total Bilirubin: 0.6 mg/dL (ref 0.3–1.2)
Total Protein: 6.9 g/dL (ref 6.5–8.1)

## 2017-12-23 LAB — GLUCOSE, CAPILLARY: Glucose-Capillary: 152 mg/dL — ABNORMAL HIGH (ref 70–99)

## 2017-12-23 LAB — URINE DRUG SCREEN, QUALITATIVE (ARMC ONLY)
AMPHETAMINES, UR SCREEN: NOT DETECTED
BARBITURATES, UR SCREEN: NOT DETECTED
Benzodiazepine, Ur Scrn: NOT DETECTED
COCAINE METABOLITE, UR ~~LOC~~: NOT DETECTED
Cannabinoid 50 Ng, Ur ~~LOC~~: NOT DETECTED
MDMA (Ecstasy)Ur Screen: NOT DETECTED
METHADONE SCREEN, URINE: POSITIVE — AB
Opiate, Ur Screen: POSITIVE — AB
Phencyclidine (PCP) Ur S: NOT DETECTED
TRICYCLIC, UR SCREEN: NOT DETECTED

## 2017-12-23 LAB — CBC
HCT: 39.1 % (ref 36.0–46.0)
Hemoglobin: 11.7 g/dL — ABNORMAL LOW (ref 12.0–15.0)
MCH: 27 pg (ref 26.0–34.0)
MCHC: 29.9 g/dL — AB (ref 30.0–36.0)
MCV: 90.3 fL (ref 80.0–100.0)
PLATELETS: 155 10*3/uL (ref 150–400)
RBC: 4.33 MIL/uL (ref 3.87–5.11)
RDW: 14.9 % (ref 11.5–15.5)
WBC: 6.7 10*3/uL (ref 4.0–10.5)
nRBC: 0 % (ref 0.0–0.2)

## 2017-12-23 LAB — ETHANOL: Alcohol, Ethyl (B): <10 mg/dL (ref <10)

## 2017-12-23 MED ORDER — SODIUM CHLORIDE 0.9 % IV BOLUS
1000.0000 mL | Freq: Once | INTRAVENOUS | Status: AC
Start: 2017-12-23 — End: 2017-12-23
  Administered 2017-12-23: 1000 mL via INTRAVENOUS

## 2017-12-23 NOTE — ED Triage Notes (Signed)
Pt brought in via ems from home.  Pt took 6 nucynta at 1400 today.  Pt was given 6mg  narcan nasally.  Pt drowsy on arrival to hallway bed.  Pt moved to room 24.  md at bedside

## 2017-12-23 NOTE — ED Notes (Addendum)
EKG given to EDP paduchowski in person.

## 2017-12-23 NOTE — ED Notes (Signed)
Lab called to draw blood

## 2017-12-23 NOTE — ED Notes (Signed)
Pt slow to answer. Drowsy.

## 2017-12-23 NOTE — ED Notes (Signed)
Pt eating dinner. Resting comfortably.

## 2017-12-23 NOTE — ED Notes (Signed)
2 RN's attempting for IV site.

## 2017-12-23 NOTE — ED Provider Notes (Signed)
[facility] Emergency Department Provider Note  Time seen: 5:28 PM  I have reviewed the triage vital signs and the nursing notes.   HISTORY  Chief Complaint No chief complaint on file.    HPI Leslie Marks is a 47 y.o. female with a past medical history of asthma, CHF, anxiety, DVT, gastric reflux, fibromyalgia on chronic opioids who presents to the emergency department after an overdose.  According EMS report boyfriend found the patient unresponsive.  Fire department arrived and administered 4 mg of intranasal Narcan with minimal response, EMS arrived and administered an additional 2 mg of intranasal Narcan and the patient began responding.  Upon arrival to the emergency department she is somnolent but awakens to voice, answers questions.  Admits to taking 3 long-acting and 3 immediate release Nucynta tablets today because her pain.  States she has done this in the past however it was not as high of a milligram tablet previously per patient.  Denies any suicidal ideation.  Denies any thoughts of hurting herself or anyone else.    Past Medical History:  Diagnosis Date  . Asthma   . B12 deficiency   . Benzodiazepine dependence ([place])   . CHF (congestive heart failure) ([place])   . Chronic abdominal pain   . Chronic anticoagulation   . Chronic anxiety   . Chronic pain syndrome   . Collagen vascular disease ([place])   . Depression   . DVT (deep venous thrombosis) ([place])   . Encephalopathy   . Fibromyalgia   . GERD (gastroesophageal reflux disease)   . Hypoglycemia   . Iron deficiency anemia   . Lumbago   . Migraine   . Non-diabetic hypoglycemia   . Opiate dependence ([place])   . Overdose   . Pulmonary emboli ([place]) 2007  . Vitamin D deficiency     Patient Active Problem List   Diagnosis Date Noted  . Opiate abuse, episodic ([place]) 04/23/2016  . Opiate overdose ([place]) 03/12/2016  . Generalized abdominal pain   . Benzodiazepine overdose 09/30/2014  .  Encephalopathy 09/30/2014  . 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 Malabsorption syndrome 05/27/2013  . Cholelithiasis 05/12/2013  . Lupus anticoagulant positive 04/29/2013  . 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 Altered mental status 04/27/2013  . Pulmonary emboli ([place]) 04/27/2013  . Chronic pain syndrome 04/27/2013  . Chronic anxiety 04/27/2013  . Non-diabetic hypoglycemia 04/27/2013  . Acute encephalopathy 04/26/2013  . Depression 04/26/2013  . Chronic anticoagulation 04/26/2013  . Pancytopenia ([place]) 04/26/2013    Past Surgical History:  Procedure Laterality Date  . ABDOMINAL HYSTERECTOMY    . APPENDECTOMY    . CESAREAN SECTION    . CHOLECYSTECTOMY    . GASTRIC BYPASS  2003  . TONSILLECTOMY      Prior to Admission medications   Medication Sig Start Date End Date Taking? Authorizing Provider  albuterol (PROVENTIL HFA;VENTOLIN HFA) 108 (90 Base) MCG/ACT inhaler Inhale 2 puffs into the lungs every 4 (four) hours as needed for wheezing or shortness of breath.    [provider]  gabapentin (NEURONTIN) 600 MG tablet Take 1,200 mg by mouth 3 (three) times daily.     [provider]  methylPREDNISolone (MEDROL DOSEPAK) 4 MG TBPK tablet Disp 4mg  dosepak.  Follow directions on packaging. 11/02/17   Orbie Pyo, MD  naloxone Karma Greaser) 0.4 MG/ML injection To be used as needed for overdose 05/30/16   Earleen Newport, MD  OLANZapine (  ZYPREXA) 20 MG tablet Take 40 mg by mouth at bedtime.     [provider]  ondansetron (ZOFRAN) 8 MG tablet Take 8 mg by mouth 3 (three) times daily.    [provider]  Oxycodone HCl 10 MG TABS Take 10 mg by mouth every 6 (six) hours. rx was written for q 5-6 h with max of 5 tabs qd    [provider]  propranolol (INDERAL) 40 MG tablet Take 40 mg by mouth 3 (three) times daily.    [provider]  Tapentadol HCl 200 MG TB12 Take 1 tablet by  mouth 2 (two) times daily.    [provider]  tiZANidine (ZANAFLEX) 4 MG tablet Take 4 mg by mouth 2 (two) times daily. Per pharmacy, rx was written for 1 bid    [provider]  topiramate (TOPAMAX) 100 MG tablet Take 100 mg by mouth daily.     [provider]  traZODone (DESYREL) 150 MG tablet Take 300 mg by mouth at bedtime.    [provider]  zolpidem (AMBIEN) 10 MG tablet Take 5-10 mg by mouth at bedtime as needed for sleep.     [provider]    Allergies  Allergen Reactions  . Amoxicillin Anaphylaxis  . Augmentin [Amoxicillin-Pot Clavulanate] Anaphylaxis  . Betadine [Povidone Iodine] Anaphylaxis  . Ciprofloxacin Anaphylaxis  . Erythromycin Anaphylaxis  . Latex Anaphylaxis  . Penicillins Anaphylaxis    Has patient had a PCN reaction causing immediate rash, facial/tongue/throat swelling, SOB or lightheadedness with hypotension: yes Has patient had a PCN reaction causing severe rash involving mucus membranes or skin necrosis: no Has patient had a PCN reaction that required hospitalization no Has patient had a PCN reaction occurring within the last 10 years: no If all of the above answers are "NO", then may proceed with Cephalosporin use.   . Adhesive [Tape] Other (See Comments)    Skin "bubbles" and blisters  . Lyrica [Pregabalin] Swelling    Family History  Problem Relation Age of Onset  . Heart failure Neg Hx     Social History Social History   Tobacco Use  . Smoking status: Never Smoker  . Smokeless tobacco: Never Used  Substance Use Topics  . Alcohol use: No  . Drug use: No    Review of Systems Constitutional: Negative for fever. Cardiovascular: Negative for chest pain. Respiratory: Negative for shortness of breath. Gastrointestinal: Negative for abdominal pain, vomiting and diarrhea. Genitourinary: Negative for urinary compaints Musculoskeletal: Negative for musculoskeletal complaints Skin: Negative for skin  complaints  Neurological: Negative for headache All other ROS negative  ____________________________________________   PHYSICAL EXAM:  Constitutional: Alert and oriented. Well appearing and in no distress. Eyes: Normal exam ENT   Head: Normocephalic and atraumatic.   Mouth/Throat: Mucous membranes are moist. Cardiovascular: Normal rate, regular rhythm. No murmur Respiratory: Normal respiratory effort without tachypnea nor retractions. Breath sounds are clear  Gastrointestinal: Soft and nontender. No distention.  Musculoskeletal: Nontender with normal range of motion in all extremities Neurologic:  Normal speech and language. No gross focal neurologic deficits Skin:  Skin is warm, dry and intact.  Psychiatric: Mood and affect are normal.   ____________________________________________    EKG  EKG reviewed and interpreted by myself shows a sinus rhythm at 64 bpm with a slightly widened QRS, normal axis, largely normal intervals, no concerning ST changes.  ____________________________________________    RADIOLOGY  Ultrasound shows hepatic steatosis status post cholecystectomy.  ____________________________________________   INITIAL IMPRESSION /  ASSESSMENT AND PLAN / ED COURSE  Pertinent labs & imaging results that were available during my care of the patient were reviewed by me and considered in my medical decision making (see chart for details).  Patient presents the emergency department with what appears to be an accidental opioid overdose.  Patient clearly denies any suicidal intent.  Has chronic pain in her back and abdomen.  States her pain was somewhat worse today so she took more of her medication.  We will check labs, IV hydrate and continue to closely monitor the patient.  We will watch on telemetry.  His labs have resulted showing elevated LFTs, we will proceed with ultrasound to further evaluate.  Ultrasound shows hepatic steatosis status post  cholecystectomy.  Patient's blood pressure has remained in the 80s and 90s.  Patient states her blood pressures normally 90/60 per patient.  Currently it is around 97 systolic over 69 diastolic.  Patient awakens easily, converses without difficulty, does not appear somnolent.  Mentating well.  I believe the patient is safe for discharge home with a responsible party.  Patient is currently calling trying to get someone to come pick her up.  I reviewed the patient's records, last ER visit her blood pressure is 324 systolic.  ER visit prior to that was 401 systolic.  Currently 97 systolic.  ____________________________________________   FINAL CLINICAL IMPRESSION(S) / ED DIAGNOSES  Opioid overdose, accidental    Harvest Dark, MD 12/23/17 [phone]

## 2017-12-23 NOTE — ED Notes (Signed)
Pt using phone to call boyfriend (ride).

## 2017-12-23 NOTE — ED Notes (Signed)
BG 152; checked per pt request.

## 2017-12-23 NOTE — Discharge Instructions (Addendum)
As we discussed it is extremely important that you do not take more than your prescribed amount of pain medication.  Taking too much pain medication may cause you to stop breathing and die.  Never drink alcohol or use any other substances while taking pain medication.  Do not drive while taking pain medication. Return to the emergency department for any other symptoms personally concerning to yourself.

## 2018-01-08 ENCOUNTER — Ambulatory Visit (INDEPENDENT_AMBULATORY_CARE_PROVIDER_SITE_OTHER): Payer: Medicaid Other | Admitting: Gastroenterology

## 2018-01-08 ENCOUNTER — Other Ambulatory Visit: Payer: Self-pay

## 2018-01-08 ENCOUNTER — Encounter: Payer: Self-pay | Admitting: Gastroenterology

## 2018-01-08 VITALS — BP 84/58 | HR 75 | Resp 17 | Wt 198.4 lb

## 2018-01-08 DIAGNOSIS — R945 Abnormal results of liver function studies: Secondary | ICD-10-CM | POA: Diagnosis not present

## 2018-01-08 DIAGNOSIS — Z9884 Bariatric surgery status: Secondary | ICD-10-CM | POA: Diagnosis not present

## 2018-01-08 DIAGNOSIS — D509 Iron deficiency anemia, unspecified: Secondary | ICD-10-CM

## 2018-01-08 DIAGNOSIS — D508 Other iron deficiency anemias: Secondary | ICD-10-CM | POA: Diagnosis not present

## 2018-01-08 DIAGNOSIS — R7989 Other specified abnormal findings of blood chemistry: Secondary | ICD-10-CM

## 2018-01-08 DIAGNOSIS — K529 Noninfective gastroenteritis and colitis, unspecified: Secondary | ICD-10-CM

## 2018-01-08 NOTE — Progress Notes (Signed)
Cephas Darby, MD [address]  [place]  [place], [place] [address]  Main: [phone]  Fax: [phone]    Gastroenterology Consultation  Referring Provider:     Midge Minium, [place] Primary Care Physician:  Midge Minium, [place] Primary Gastroenterologist:  Dr. Cephas Darby Reason for Consultation:     GERD, chronic diarrhea, iron deficiency anemia        HPI:   Leslie Marks is a 47 y.o. Caucasian female referred by Dr. Ricard Dillon, Scarlette Calico, PA  for consultation & management of chronic heartburn, postprandial urgency and chronic iron deficiency anemia.  Patient had a history of Roux-en-Y gastric bypass in 2003, chronic abdominal pain, nucynta and gabapentin, history of PE, DVT, history of lupus on chronic anticoagulation with Lovenox, transverse myelitis, bilateral upper extremity weakness.  Patient reports that she has several years history of postprandial urgency and diarrhea, nonbloody watery bowel movements sometimes nocturnal diarrhea associated with abdominal bloating.  She also reports several years history of heartburn for which she is taking Nexium 40 mg twice daily before meals, keeps it under control.  Patient reports that she has been gaining weight in the last few years.  She was also found to have chronically elevated LFTs since 2016 and ultrasound revealed fatty liver recently.  She does not have chronic hepatitis B or C.  She denies drinking alcohol. Patient has chronic iron deficiency and B12 deficiency anemia.  She does not take iron supplements.  She takes B12 injections She denies nausea, vomiting, loss of appetite, rectal bleeding  NSAIDs: None  Antiplts/Anticoagulants/Anti thrombotics: Lovenox for history of PE and DVT  GI Procedures: Having had EGD and colonoscopy several years ago, results not available  Past Medical History:  Diagnosis Date  . Asthma   . B12 deficiency   . Benzodiazepine dependence ([place])   . CHF (congestive heart  failure) ([place])   . Chronic abdominal pain   . Chronic anticoagulation   . Chronic anxiety   . Chronic pain syndrome   . Collagen vascular disease ([place])   . Depression   . DVT (deep venous thrombosis) ([place])   . Encephalopathy   . Fibromyalgia   . GERD (gastroesophageal reflux disease)   . Hypoglycemia   . Iron deficiency anemia   . Lumbago   . Migraine   . Non-diabetic hypoglycemia   . Opiate dependence ([place])   . Overdose   . Pulmonary emboli ([place]) 2007  . Vitamin D deficiency     Past Surgical History:  Procedure Laterality Date  . ABDOMINAL HYSTERECTOMY    . APPENDECTOMY    . CESAREAN SECTION    . CHOLECYSTECTOMY    . GASTRIC BYPASS  2003  . TONSILLECTOMY      Current Outpatient Medications:  .  acetaminophen (TYLENOL) 325 MG tablet, Take by mouth., Disp: , Rfl:  .  albuterol (PROVENTIL HFA;VENTOLIN HFA) 108 (90 Base) MCG/ACT inhaler, Inhale 2 puffs into the lungs every 4 (four) hours as needed for wheezing or shortness of breath., Disp: , Rfl:  .  aspirin 81 MG chewable tablet, Chew by mouth., Disp: , Rfl:  .  Blood Glucose Monitoring Suppl (FIFTY50 GLUCOSE METER 2.0) w/Device KIT, Use as directed, Disp: , Rfl:  .  buPROPion (WELLBUTRIN XL) 150 MG 24 hr tablet, Take by mouth., Disp: , Rfl:  .  cyanocobalamin (,VITAMIN B-12,) 1000 MCG/ML injection, Inject into the muscle., Disp: , Rfl:  .  diclofenac sodium (VOLTAREN) 1 % GEL, Apply  topically., Disp: , Rfl:  .  doxepin (SINEQUAN) 10 MG capsule, Take by mouth., Disp: , Rfl:  .  enoxaparin (LOVENOX) 80 MG/0.8ML injection, Inject into the skin., Disp: , Rfl:  .  esomeprazole (NEXIUM) 40 MG capsule, Take by mouth., Disp: , Rfl:  .  fluticasone (FLONASE) 50 MCG/ACT nasal spray, Place into the nose., Disp: , Rfl:  .  furosemide (LASIX) 20 MG tablet, Take by mouth., Disp: , Rfl:  .  glucose blood (PRECISION QID TEST) test strip, 6 x day ICD-10-CM R73.09 E16.2, Disp: , Rfl:  .  glucose blood (PRECISION QID TEST) test strip, 6 x  day ICD-10-CM R73.09 E16.2, Disp: , Rfl:  .  Lancets Misc. (UNISTIK 2 NORMAL) MISC, 6 x per day; Accu-Check softclix lancets ICD-10-CM  R73.09 E16.2, Disp: , Rfl:  .  naloxone (NARCAN) 0.4 MG/ML injection, To be used as needed for overdose, Disp: 1 mL, Rfl: 1 .  OLANZapine (ZYPREXA) 20 MG tablet, Take 40 mg by mouth at bedtime. , Disp: , Rfl:  .  ondansetron (ZOFRAN) 8 MG tablet, Take 8 mg by mouth 3 (three) times daily., Disp: , Rfl:  .  pregabalin (LYRICA) 100 MG capsule, Take by mouth., Disp: , Rfl:  .  promethazine (PHENERGAN) 12.5 MG tablet, Take by mouth., Disp: , Rfl:  .  propranolol (INDERAL) 40 MG tablet, Take 40 mg by mouth 3 (three) times daily., Disp: , Rfl:  .  tapentadol HCl (NUCYNTA) 75 MG tablet, Take by mouth., Disp: , Rfl:  .  Tapentadol HCl 150 MG TB12, Take by mouth., Disp: , Rfl:  .  tiZANidine (ZANAFLEX) 4 MG tablet, Take 4 mg by mouth 2 (two) times daily. Per pharmacy, rx was written for 1 bid, Disp: , Rfl:  .  topiramate (TOPAMAX) 100 MG tablet, Take 100 mg by mouth daily. , Disp: , Rfl:  .  traZODone (DESYREL) 100 MG tablet, Take by mouth., Disp: , Rfl:  .  traZODone (DESYREL) 150 MG tablet, Take 300 mg by mouth at bedtime., Disp: , Rfl:  .  vortioxetine HBr (TRINTELLIX) 20 MG TABS tablet, Take by mouth., Disp: , Rfl:  .  zolpidem (AMBIEN) 10 MG tablet, Take 5-10 mg by mouth at bedtime as needed for sleep. , Disp: , Rfl:  .  Estradiol 10 MCG TABS vaginal tablet, Place vaginally., Disp: , Rfl:  .  gabapentin (NEURONTIN) 600 MG tablet, Take 1,200 mg by mouth 3 (three) times daily. , Disp: , Rfl:  .  Oxycodone HCl 10 MG TABS, Take 10 mg by mouth every 6 (six) hours. rx was written for q 5-6 h with max of 5 tabs qd, Disp: , Rfl:  .  tapentadol (NUCYNTA) 50 MG tablet, Take by mouth., Disp: , Rfl:  .  Tapentadol HCl 200 MG TB12, Take 1 tablet by mouth 2 (two) times daily., Disp: , Rfl:  .  zolpidem (AMBIEN) 5 MG tablet, Take by mouth., Disp: , Rfl:    Family History    Problem Relation Age of Onset  . Heart failure Neg Hx      Social History   Tobacco Use  . Smoking status: Never Smoker  . Smokeless tobacco: Never Used  Substance Use Topics  . Alcohol use: No  . Drug use: No    Allergies as of 01/08/2018 - Review Complete 01/08/2018  Allergen Reaction Noted  . Amoxicillin Anaphylaxis 04/28/2013  . Augmentin [amoxicillin-pot clavulanate] Anaphylaxis 04/28/2013  . Betadine [povidone iodine] Anaphylaxis 04/28/2013  . Ciprofloxacin  Anaphylaxis 04/28/2013  . Erythromycin Anaphylaxis 04/28/2013  . Latex Anaphylaxis 04/28/2013  . Penicillins Anaphylaxis 04/28/2013  . Adhesive [tape] Other (See Comments) 04/28/2013  . Lisinopril Cough 04/21/2017  . Lyrica [pregabalin] Swelling 09/20/2014  . Tapentadol Other (See Comments) 04/28/2013    Review of Systems:    All systems reviewed and negative except where noted in HPI.   Physical Exam:  BP (!) 84/58 (BP Location: Left Arm, Patient Position: Sitting, Cuff Size: Large)   Pulse 75   Resp 17   Wt 198 lb 6.4 oz (90 kg)   BMI 35.14 kg/m  No LMP recorded. Patient has had a hysterectomy.  General:   Alert,  Well-developed, well-nourished, pleasant and cooperative in NAD Head:  Normocephalic and atraumatic. Eyes:  Sclera clear, no icterus.   Conjunctiva pink. Ears:  Normal auditory acuity. Nose:  No deformity, discharge, or lesions. Mouth:  No deformity or lesions,oropharynx pink & moist. Neck:  Supple; no masses or thyromegaly. Lungs:  Respirations even and unlabored.  Clear throughout to auscultation.   No wheezes, crackles, or rhonchi. No acute distress. Heart:  Regular rate and rhythm; no murmurs, clicks, rubs, or gallops. Abdomen:  Normal bowel sounds. Soft, non-tender and non-distended without masses, midline vertical scar, no hepatosplenomegaly or hernias noted.  No guarding or rebound tenderness.   Rectal: Not performed Msk:  Symmetrical without gross deformities. Good, equal movement &  strength bilaterally. Pulses:  Normal pulses noted. Extremities:  No clubbing or edema.  No cyanosis. Neurologic:  Alert and oriented x3;  grossly normal neurologically. Skin:  Intact without significant lesions or rashes. No jaundice. Psych:  Alert and cooperative. Normal mood and affect.  Imaging Studies: Reviewed  Assessment and Plan:   TRENIKA HUDSON is a 47 y.o. 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

## 2018-01-09 LAB — MITOCHONDRIAL/SMOOTH MUSCLE AB PNL
Mitochondrial Ab: <20 Units (ref 0.0–20.0)
Smooth Muscle Ab: 26 Units — ABNORMAL HIGH (ref 0–19)

## 2018-01-09 LAB — CERULOPLASMIN: CERULOPLASMIN: 25.1 mg/dL (ref 19.0–39.0)

## 2018-01-09 LAB — CELIAC AB TTG DGP TIGA
ANTIGLIADIN ABS, IGA: 6 U (ref 0–19)
GLIADIN IGG: 3 U (ref 0–19)
IgA/Immunoglobulin A, Serum: 234 mg/dL (ref 87–352)
Tissue Transglut Ab: <2 U/mL (ref 0–5)
Transglutaminase IgA: <2 U/mL (ref 0–3)

## 2018-01-09 LAB — SPECIMEN STATUS REPORT

## 2018-01-09 LAB — ALPHA-1-ANTITRYPSIN: A1 ANTITRYPSIN: 150 mg/dL (ref 101–187)

## 2018-01-09 LAB — ANTI-PARIETAL ANTIBODY: PARIETAL CELL AB: 1.7 U (ref 0.0–20.0)

## 2018-01-09 LAB — ANTI-MICROSOMAL ANTIBODY LIVER / KIDNEY: LKM1 Ab: 0.9 Units (ref 0.0–20.0)

## 2018-01-09 LAB — ZINC

## 2018-01-09 LAB — COPPER, SERUM

## 2018-01-12 LAB — ANA: Anti Nuclear Antibody(ANA): NEGATIVE

## 2018-01-12 LAB — HEPATIC FUNCTION PANEL
ALT: 90 IU/L — ABNORMAL HIGH (ref 0–32)
AST: 53 IU/L — AB (ref 0–40)
Albumin: 4.3 g/dL (ref 3.5–5.5)
Alkaline Phosphatase: 181 IU/L — ABNORMAL HIGH (ref 39–117)
Bilirubin Total: 0.3 mg/dL (ref 0.0–1.2)
Bilirubin, Direct: 0.09 mg/dL (ref 0.00–0.40)
TOTAL PROTEIN: 6.9 g/dL (ref 6.0–8.5)

## 2018-01-12 LAB — IGG, IGA, IGM
IGA/IMMUNOGLOBULIN A, SERUM: 287 mg/dL (ref 87–352)
IGG (IMMUNOGLOBIN G), SERUM: 1043 mg/dL (ref 700–1600)
IgM (Immunoglobulin M), Srm: 202 mg/dL (ref 26–217)

## 2018-01-12 LAB — IRON AND TIBC
Iron Saturation: 12 % — ABNORMAL LOW (ref 15–55)
Iron: 52 ug/dL (ref 27–159)
Total Iron Binding Capacity: 429 ug/dL (ref 250–450)
UIBC: 377 ug/dL (ref 131–425)

## 2018-01-12 LAB — VITAMIN B12: Vitamin B-12: 351 pg/mL (ref 232–1245)

## 2018-01-12 LAB — FERRITIN: Ferritin: 23 ng/mL (ref 15–150)

## 2018-01-13 ENCOUNTER — Other Ambulatory Visit: Payer: Self-pay

## 2018-01-13 DIAGNOSIS — R7989 Other specified abnormal findings of blood chemistry: Secondary | ICD-10-CM

## 2018-01-13 DIAGNOSIS — R945 Abnormal results of liver function studies: Principal | ICD-10-CM

## 2018-01-13 NOTE — Progress Notes (Signed)
Liver biopsy has been ordered and pt has been notified and verbalized understanding

## 2018-01-14 ENCOUNTER — Telehealth: Payer: Self-pay

## 2018-01-14 NOTE — Telephone Encounter (Signed)
Spoke with pt to confirm Liver biopsy appt scheduled for 01/21/18 @ 9:00am, pt verbalized understanding

## 2018-01-20 ENCOUNTER — Other Ambulatory Visit: Payer: Self-pay | Admitting: Radiology

## 2018-01-20 ENCOUNTER — Other Ambulatory Visit: Payer: Self-pay

## 2018-01-20 NOTE — Progress Notes (Signed)
Spoke with Dr Barbie Banner regarding pt off lovenox for procedure tomorrow with no pm dose today, given ok to proceed with procedure tomorrow.

## 2018-01-21 ENCOUNTER — Other Ambulatory Visit: Payer: Self-pay

## 2018-01-21 ENCOUNTER — Ambulatory Visit
Admission: RE | Admit: 2018-01-21 | Discharge: 2018-01-21 | Disposition: A | Discharge status: Home / Self Care | Payer: Medicaid Other | Source: Ambulatory Visit | Attending: Gastroenterology | Admitting: Gastroenterology

## 2018-01-21 DIAGNOSIS — I69398 Other sequelae of cerebral infarction: Secondary | ICD-10-CM | POA: Insufficient documentation

## 2018-01-21 DIAGNOSIS — R7989 Other specified abnormal findings of blood chemistry: Secondary | ICD-10-CM | POA: Insufficient documentation

## 2018-01-21 DIAGNOSIS — Z880 Allergy status to penicillin: Secondary | ICD-10-CM | POA: Insufficient documentation

## 2018-01-21 DIAGNOSIS — R109 Unspecified abdominal pain: Secondary | ICD-10-CM | POA: Insufficient documentation

## 2018-01-21 DIAGNOSIS — Z7951 Long term (current) use of inhaled steroids: Secondary | ICD-10-CM | POA: Diagnosis not present

## 2018-01-21 DIAGNOSIS — M797 Fibromyalgia: Secondary | ICD-10-CM | POA: Insufficient documentation

## 2018-01-21 DIAGNOSIS — E538 Deficiency of other specified B group vitamins: Secondary | ICD-10-CM | POA: Insufficient documentation

## 2018-01-21 DIAGNOSIS — Z9884 Bariatric surgery status: Secondary | ICD-10-CM | POA: Insufficient documentation

## 2018-01-21 DIAGNOSIS — Z86711 Personal history of pulmonary embolism: Secondary | ICD-10-CM | POA: Diagnosis not present

## 2018-01-21 DIAGNOSIS — M329 Systemic lupus erythematosus, unspecified: Secondary | ICD-10-CM | POA: Diagnosis not present

## 2018-01-21 DIAGNOSIS — Z7982 Long term (current) use of aspirin: Secondary | ICD-10-CM | POA: Diagnosis not present

## 2018-01-21 DIAGNOSIS — Z86718 Personal history of other venous thrombosis and embolism: Secondary | ICD-10-CM | POA: Insufficient documentation

## 2018-01-21 DIAGNOSIS — K219 Gastro-esophageal reflux disease without esophagitis: Secondary | ICD-10-CM | POA: Insufficient documentation

## 2018-01-21 DIAGNOSIS — J45909 Unspecified asthma, uncomplicated: Secondary | ICD-10-CM | POA: Diagnosis not present

## 2018-01-21 DIAGNOSIS — Z91040 Latex allergy status: Secondary | ICD-10-CM | POA: Insufficient documentation

## 2018-01-21 DIAGNOSIS — Z888 Allergy status to other drugs, medicaments and biological substances status: Secondary | ICD-10-CM | POA: Insufficient documentation

## 2018-01-21 DIAGNOSIS — I509 Heart failure, unspecified: Secondary | ICD-10-CM | POA: Insufficient documentation

## 2018-01-21 DIAGNOSIS — Z79899 Other long term (current) drug therapy: Secondary | ICD-10-CM | POA: Diagnosis not present

## 2018-01-21 DIAGNOSIS — G894 Chronic pain syndrome: Secondary | ICD-10-CM | POA: Insufficient documentation

## 2018-01-21 DIAGNOSIS — R945 Abnormal results of liver function studies: Secondary | ICD-10-CM

## 2018-01-21 DIAGNOSIS — Z7901 Long term (current) use of anticoagulants: Secondary | ICD-10-CM | POA: Insufficient documentation

## 2018-01-21 DIAGNOSIS — E559 Vitamin D deficiency, unspecified: Secondary | ICD-10-CM | POA: Diagnosis not present

## 2018-01-21 DIAGNOSIS — Z881 Allergy status to other antibiotic agents status: Secondary | ICD-10-CM | POA: Insufficient documentation

## 2018-01-21 HISTORY — DX: Cerebral infarction, unspecified: I63.9

## 2018-01-21 LAB — CBC
HCT: 38.4 % (ref 36.0–46.0)
Hemoglobin: 11.9 g/dL — ABNORMAL LOW (ref 12.0–15.0)
MCH: 26.7 pg (ref 26.0–34.0)
MCHC: 31 g/dL (ref 30.0–36.0)
MCV: 86.1 fL (ref 80.0–100.0)
Platelets: 164 10*3/uL (ref 150–400)
RBC: 4.46 MIL/uL (ref 3.87–5.11)
RDW: 15.1 % (ref 11.5–15.5)
WBC: 2.7 10*3/uL — ABNORMAL LOW (ref 4.0–10.5)
nRBC: 0 % (ref 0.0–0.2)

## 2018-01-21 LAB — PROTIME-INR
INR: 1.05
Prothrombin Time: 13.6 seconds (ref 11.4–15.2)

## 2018-01-21 LAB — APTT: aPTT: 29 seconds (ref 24–36)

## 2018-01-21 MED ORDER — MIDAZOLAM HCL 5 MG/5ML IJ SOLN
INTRAMUSCULAR | Status: AC
  Filled 2018-01-21: qty 10

## 2018-01-21 MED ORDER — SODIUM CHLORIDE 0.9 % IV SOLN
INTRAVENOUS | Status: DC
  Administered 2018-01-21: 1000 mL via INTRAVENOUS

## 2018-01-21 MED ORDER — FENTANYL CITRATE (PF) 100 MCG/2ML IJ SOLN
INTRAMUSCULAR | Status: AC | PRN
  Administered 2018-01-21 (×4): 25 ug via INTRAVENOUS

## 2018-01-21 MED ORDER — HYDROCODONE-ACETAMINOPHEN 5-325 MG PO TABS: 1.0000 | ORAL_TABLET | ORAL | Status: DC | PRN

## 2018-01-21 MED ORDER — LABETALOL HCL 5 MG/ML IV SOLN: 10.0000 mg | Freq: Once | INTRAVENOUS | Status: DC

## 2018-01-21 MED ORDER — PROPRANOLOL HCL 40 MG PO TABS
40.0000 mg | ORAL_TABLET | Freq: Once | ORAL | Status: AC
  Administered 2018-01-21: 40 mg via ORAL
  Filled 2018-01-21 (×2): qty 1

## 2018-01-21 MED ORDER — MIDAZOLAM HCL 2 MG/2ML IJ SOLN
INTRAMUSCULAR | Status: AC | PRN
  Administered 2018-01-21 (×4): 1 mg via INTRAVENOUS

## 2018-01-21 MED ORDER — FENTANYL CITRATE (PF) 100 MCG/2ML IJ SOLN
INTRAMUSCULAR | Status: AC
  Filled 2018-01-21: qty 4

## 2018-01-21 NOTE — H&P (Signed)
Chief Complaint: Patient was seen in consultation today for an ultrasound-guided random liver biopsy at the request of Lin Landsman  Referring Physician(s): Lin Landsman  Patient Status: [facility] - Out-pt  History of Present Illness: Leslie Marks is a 47 y.o. female with a complex medical history including a previous Roux-en-Y gastric bypass procedure, chronic anticoagulation for lupus and recurrent thrombotic disease and chronically elevated LFTs since 2016.  Past medical history is also significant for prolonged recovery following abdominal surgery at [facility].  Patient has been referred for a random liver biopsy.  Patient has no new complaints today.  She has chronic abdominal pain.  Problems with the diarrhea and constipation.  History of recurrent urinary tract infections although she currently has no urinary issues or dysuria.  No respiratory problems at this time.  History of stroke with decreased function in the right upper extremity.  Past Medical History:  Diagnosis Date  . Asthma   . B12 deficiency   . Benzodiazepine dependence ([place])   . CHF (congestive heart failure) ([place])   . Chronic abdominal pain   . Chronic anticoagulation   . Chronic anxiety   . Chronic pain syndrome   . Collagen vascular disease ([place])   . Depression   . DVT (deep venous thrombosis) ([place])   . Encephalopathy   . Fibromyalgia   . GERD (gastroesophageal reflux disease)   . Hypoglycemia   . Iron deficiency anemia   . Lumbago   . Migraine   . Non-diabetic hypoglycemia   . Opiate dependence ([place])   . Overdose   . Pulmonary emboli ([place]) 2007  . Stroke ([place])   . Vitamin D deficiency     Past Surgical History:  Procedure Laterality Date  . ABDOMINAL HYSTERECTOMY    . APPENDECTOMY    . CESAREAN SECTION    . CHOLECYSTECTOMY    . GASTRIC BYPASS  2003  . TONSILLECTOMY      Allergies: Amoxicillin; Augmentin [amoxicillin-pot clavulanate]; Betadine [povidone iodine]; Ciprofloxacin;  Erythromycin; Latex; Penicillins; Adhesive [tape]; Lisinopril; and Tapentadol  Medications: Prior to Admission medications   Medication Sig Start Date End Date Taking? Authorizing Provider  acetaminophen (TYLENOL) 325 MG tablet Take by mouth. 09/06/16  Yes [provider]  albuterol (PROVENTIL HFA;VENTOLIN HFA) 108 (90 Base) MCG/ACT inhaler Inhale 2 puffs into the lungs every 4 (four) hours as needed for wheezing or shortness of breath.   Yes [provider]  aspirin 81 MG chewable tablet Chew by mouth. 04/15/17  Yes [provider]  buPROPion (WELLBUTRIN XL) 150 MG 24 hr tablet Take by mouth. 10/22/17  Yes [provider]  cyanocobalamin (,VITAMIN B-12,) 1000 MCG/ML injection Inject into the muscle. 10/24/17  Yes [provider]  diclofenac sodium (VOLTAREN) 1 % GEL Apply topically. 11/25/17 11/25/18 Yes [provider]  doxepin (SINEQUAN) 10 MG capsule Take by mouth. 12/09/17 03/03/18 Yes [provider]  enoxaparin (LOVENOX) 80 MG/0.8ML injection Inject into the skin. 06/16/15  Yes [provider]  esomeprazole (NEXIUM) 40 MG capsule Take by mouth. 04/21/17  Yes [provider]  fluticasone (FLONASE) 50 MCG/ACT nasal spray Place into the nose. 06/09/15  Yes [provider]  OLANZapine (ZYPREXA) 20 MG tablet Take 40 mg by mouth at bedtime.    Yes [provider]  ondansetron (ZOFRAN) 8 MG tablet Take 8 mg by mouth 3 (three) times daily.   Yes [provider]  Oxycodone HCl 10 MG TABS Take 10 mg by mouth every 6 (six)  hours. rx was written for q 5-6 h with max of 5 tabs qd   Yes [provider]  pregabalin (LYRICA) 100 MG capsule Take by mouth. 01/04/18 04/04/18 Yes [provider]  promethazine (PHENERGAN) 12.5 MG tablet Take by mouth.   Yes [provider]  propranolol (INDERAL) 40 MG tablet Take 40 mg by mouth 3 (three) times daily.   Yes [provider]    tapentadol (NUCYNTA) 50 MG tablet Take 75 mg by mouth.    Yes [provider]  Tapentadol HCl 200 MG TB12 Take 1 tablet by mouth 2 (two) times daily.   Yes [provider]  tiZANidine (ZANAFLEX) 4 MG tablet Take 4 mg by mouth 2 (two) times daily. Per pharmacy, rx was written for 1 bid   Yes [provider]  topiramate (TOPAMAX) 100 MG tablet Take 100 mg by mouth daily.    Yes [provider]  traZODone (DESYREL) 150 MG tablet Take 300 mg by mouth at bedtime.   Yes [provider]  vortioxetine HBr (TRINTELLIX) 20 MG TABS tablet Take by mouth. 09/16/17  Yes [provider]  zolpidem (AMBIEN) 10 MG tablet Take 5-10 mg by mouth at bedtime as needed for sleep.    Yes [provider]  Estradiol 10 MCG TABS vaginal tablet Place vaginally. 08/28/17 08/28/18  [provider]  furosemide (LASIX) 20 MG tablet Take by mouth. 10/17/17   [provider]  gabapentin (NEURONTIN) 600 MG tablet Take 1,200 mg by mouth 3 (three) times daily.     [provider]  glucose blood (PRECISION QID TEST) test strip 6 x day ICD-10-CM R73.09 E16.2 09/28/14   [provider]  glucose blood (PRECISION QID TEST) test strip 6 x day ICD-10-CM R73.09 E16.2 09/28/14   [provider]  Lancets Misc. (UNISTIK 2 NORMAL) MISC 6 x per day; Accu-Check softclix lancets ICD-10-CM  R73.09 E16.2 09/28/14   [provider]  naloxone Karma Greaser) 0.4 MG/ML injection To be used as needed for overdose 05/30/16   Earleen Newport, MD  traZODone (DESYREL) 100 MG tablet Take by mouth. 06/16/15   [provider]  zolpidem Lorrin Mais) 5 MG tablet Take by mouth. 01/06/18 01/27/18  [provider]     Family History  Problem Relation Age of Onset  . Heart failure Neg Hx     Social History   Socioeconomic History  . Marital status: Single    Spouse name: Not on file  . Number of children: Not on file  . Years of  education: Not on file  . Highest education level: Not on file  Occupational History  . Not on file  Social Needs  . Financial resource strain: Not on file  . Food insecurity:    Worry: Not on file    Inability: Not on file  . Transportation needs:    Medical: Not on file    Non-medical: Not on file  Tobacco Use  . Smoking status: Never Smoker  . Smokeless tobacco: Never Used  Substance and Sexual Activity  . Alcohol use: No  . Drug use: No  . Sexual activity: Not on file  Lifestyle  . Physical activity:    Days per week: Not on file    Minutes per session: Not on file  . Stress: Not on file  Relationships  . Social connections:    Talks on phone: Not on file    Gets together: Not on file  Attends religious service: Not on file    Active member of club or organization: Not on file    Attends meetings of clubs or organizations: Not on file    Relationship status: Not on file  Other Topics Concern  . Not on file  Social History Narrative  . Not on file     Review of Systems: A 12 point ROS discussed and pertinent positives are indicated in the HPI above.  All other systems are negative.  Review of Systems  Constitutional: Negative.   Respiratory: Negative.   Cardiovascular: Negative.   Gastrointestinal: Positive for abdominal pain, constipation and diarrhea.  Genitourinary: Negative.   Neurological: Positive for weakness.    Vital Signs: BP 132/85   Pulse 83   Temp 97.6 F (36.4 C) (Oral)   Resp 16   Ht 5\' 3"  (1.6 m)   Wt 90 kg   SpO2 99%   BMI 35.15 kg/m   Physical Exam  Constitutional: No distress.  HENT:  Mouth/Throat: Oropharynx is clear and moist.  Cardiovascular: Normal rate, regular rhythm and normal heart sounds.  Pulmonary/Chest: Effort normal and breath sounds normal.  Abdominal: Soft. Bowel sounds are normal.  Large scar along the abdominal midline.    Imaging: US Abdomen Limited Ruq  Result Date: 12/23/2017 CLINICAL DATA:  Initial  evaluation for elevated LFTs. EXAM: ULTRASOUND ABDOMEN LIMITED RIGHT UPPER QUADRANT COMPARISON:  Prior CT from 06/05/2016 FINDINGS: Gallbladder: Surgically absent. No abnormality at the gallbladder fossa. No sonographic Murphy sign elicited on exam. Common bile duct: Diameter: 5.9 mm Liver: No focal lesion identified. Diffusely increased echogenicity within the hepatic parenchyma, suggesting steatosis. Portal vein is patent on color Doppler imaging with normal direction of blood flow towards the liver. IMPRESSION: 1. Increased echogenicity within the hepatic parenchyma, suggesting steatosis. 2. Status post cholecystectomy.  No biliary dilatation. Electronically Signed   By: Jeannine Boga M.D.   On: 12/23/2017 20:59    Labs:  CBC: Recent Labs    07/11/17 1630 12/23/17 1833 01/21/18 1005  WBC 3.0* 6.7 2.7*  HGB 10.7* 11.7* 11.9*  HCT 32.5* 39.1 38.4  PLT 123* 155 164    COAGS: Recent Labs    01/21/18 1005  INR 1.05  APTT 29    BMP: Recent Labs    07/11/17 1630 12/23/17 1833  NA 140 142  K 3.5 4.1  CL 107 110  CO2 29 25  GLUCOSE 92 98  BUN 11 18  CALCIUM 8.9 9.0  CREATININE 0.89 1.04*  GFRNONAA >60 >60  GFRAA >60 >60    LIVER FUNCTION TESTS: Recent Labs    07/11/17 1630 12/23/17 1833 01/08/18 1434  BILITOT 0.5 0.6 0.3  AST 32 146* 53*  ALT 35 70* 90*  ALKPHOS 163* 158* 181*  PROT 6.7 6.9 6.9  ALBUMIN 3.9 4.0 4.3    TUMOR MARKERS: No results for input(s): AFPTM, CEA, CA199, CHROMGRNA in the last 8760 hours.  Assessment and Plan:  47 year old with multiple medical problems including chronically elevated LFTs.  Patient presents for a ultrasound-guided random liver biopsy.  Patient is on chronic anticoagulation and stopped her Lovenox 2 days ago.  We discussed ultrasound-guided liver biopsy.  Risks and benefits discussed with the patient including, but not limited to bleeding, infection, damage to adjacent structures or low yield requiring additional  tests.  All of the patient's questions were answered, patient is agreeable to proceed.  Consent signed and in chart.  Plan for ultrasound-guided random liver biopsy with moderate sedation.  Thank you for this interesting consult.  I greatly enjoyed meeting Leslie Marks and look forward to participating in their care.  A copy of this report was sent to the requesting provider on this date.  Electronically Signed: Burman Riis, MD 01/21/2018, 10:30 AM   I spent a total of  15 Minutes   in face to face in clinical consultation, greater than 50% of which was counseling/coordinating care for a liver biopsy

## 2018-01-21 NOTE — Procedures (Signed)
Interventional Radiology Procedure Note  Procedure: US guided random liver biopsy  Complications: None  Estimated Blood Loss: Minimal  Findings: 3 cores from right hepatic lobe.  Adequate specimens obtained.    Leslie Marks R. Anselm Pancoast, M.D Pager:  [phone]

## 2018-01-22 ENCOUNTER — Encounter: Payer: Self-pay | Admitting: Nurse Practitioner

## 2018-01-22 LAB — SURGICAL PATHOLOGY

## 2018-01-23 ENCOUNTER — Ambulatory Visit: Payer: Self-pay | Admitting: Nurse Practitioner

## 2018-01-26 ENCOUNTER — Encounter: Admission: RE | Payer: Self-pay | Source: Ambulatory Visit

## 2018-01-26 ENCOUNTER — Ambulatory Visit: Admission: RE | Admit: 2018-01-26 | Payer: Medicaid Other | Source: Ambulatory Visit | Admitting: Gastroenterology

## 2018-01-26 SURGERY — ESOPHAGOGASTRODUODENOSCOPY (EGD) WITH PROPOFOL
Anesthesia: General

## 2018-01-30 ENCOUNTER — Telehealth: Payer: Self-pay | Admitting: Gastroenterology

## 2018-01-30 ENCOUNTER — Ambulatory Visit: Payer: Self-pay | Admitting: Nurse Practitioner

## 2018-01-30 NOTE — Telephone Encounter (Signed)
Patient called to see if her liver biopsy results were ready. She would also like to have her colon test rescheduled asap.

## 2018-02-05 ENCOUNTER — Telehealth: Payer: Self-pay | Admitting: Gastroenterology

## 2018-02-05 NOTE — Telephone Encounter (Signed)
Pt is calling to see if her results, are ready. Pls call

## 2018-02-06 NOTE — Telephone Encounter (Signed)
Pt is calling to schedule  A upper and Lower Gi and would like her Liver biopsy results cb [phone] or cb [phone]

## 2018-02-12 ENCOUNTER — Telehealth: Payer: Self-pay | Admitting: Gastroenterology

## 2018-02-12 ENCOUNTER — Other Ambulatory Visit: Payer: Self-pay

## 2018-02-12 DIAGNOSIS — K529 Noninfective gastroenteritis and colitis, unspecified: Secondary | ICD-10-CM

## 2018-02-12 NOTE — Telephone Encounter (Signed)
Pt has been scheduled for EGD/Colonoscopy on 03/05/18

## 2018-02-12 NOTE — Telephone Encounter (Signed)
Patient has been notified of biopsy results and verbalized understanding

## 2018-02-12 NOTE — Telephone Encounter (Signed)
PT left vm for her results she just missed a call

## 2018-02-12 NOTE — Telephone Encounter (Signed)
Pt has been notified of lab results and verbalized understanding  

## 2018-02-16 ENCOUNTER — Ambulatory Visit
Admission: EM | Admit: 2018-02-16 | Discharge: 2018-02-16 | Disposition: A | Discharge status: Home / Self Care | Payer: No Typology Code available for payment source | Attending: Emergency Medicine | Admitting: Emergency Medicine

## 2018-02-16 ENCOUNTER — Emergency Department
Admission: EM | Admit: 2018-02-16 | Discharge: 2018-02-17 | Disposition: A | Discharge status: Home / Self Care | Payer: Medicaid Other | Attending: Emergency Medicine | Admitting: Emergency Medicine

## 2018-02-16 ENCOUNTER — Encounter: Payer: Self-pay | Admitting: Emergency Medicine

## 2018-02-16 DIAGNOSIS — T7421XA Adult sexual abuse, confirmed, initial encounter: Secondary | ICD-10-CM | POA: Diagnosis not present

## 2018-02-16 DIAGNOSIS — Z9049 Acquired absence of other specified parts of digestive tract: Secondary | ICD-10-CM | POA: Insufficient documentation

## 2018-02-16 DIAGNOSIS — I2510 Atherosclerotic heart disease of native coronary artery without angina pectoris: Secondary | ICD-10-CM | POA: Insufficient documentation

## 2018-02-16 DIAGNOSIS — Z0441 Encounter for examination and observation following alleged adult rape: Secondary | ICD-10-CM | POA: Diagnosis not present

## 2018-02-16 DIAGNOSIS — Y9389 Activity, other specified: Secondary | ICD-10-CM | POA: Insufficient documentation

## 2018-02-16 DIAGNOSIS — I509 Heart failure, unspecified: Secondary | ICD-10-CM | POA: Insufficient documentation

## 2018-02-16 DIAGNOSIS — F419 Anxiety disorder, unspecified: Secondary | ICD-10-CM | POA: Insufficient documentation

## 2018-02-16 DIAGNOSIS — F329 Major depressive disorder, single episode, unspecified: Secondary | ICD-10-CM | POA: Diagnosis not present

## 2018-02-16 DIAGNOSIS — Z9884 Bariatric surgery status: Secondary | ICD-10-CM | POA: Insufficient documentation

## 2018-02-16 DIAGNOSIS — F1120 Opioid dependence, uncomplicated: Secondary | ICD-10-CM | POA: Diagnosis not present

## 2018-02-16 DIAGNOSIS — Z8673 Personal history of transient ischemic attack (TIA), and cerebral infarction without residual deficits: Secondary | ICD-10-CM | POA: Insufficient documentation

## 2018-02-16 DIAGNOSIS — Z91040 Latex allergy status: Secondary | ICD-10-CM | POA: Insufficient documentation

## 2018-02-16 DIAGNOSIS — J45909 Unspecified asthma, uncomplicated: Secondary | ICD-10-CM | POA: Insufficient documentation

## 2018-02-16 DIAGNOSIS — T7621XA Adult sexual abuse, suspected, initial encounter: Secondary | ICD-10-CM | POA: Diagnosis present

## 2018-02-16 LAB — URINALYSIS, COMPLETE (UACMP) WITH MICROSCOPIC
BILIRUBIN URINE: NEGATIVE
Bacteria, UA: NONE SEEN
Glucose, UA: NEGATIVE mg/dL
Hgb urine dipstick: NEGATIVE
Ketones, ur: NEGATIVE mg/dL
Leukocytes, UA: NEGATIVE
Nitrite: NEGATIVE
PROTEIN: NEGATIVE mg/dL
Specific Gravity, Urine: 1.009 (ref 1.005–1.030)
pH: 6 (ref 5.0–8.0)

## 2018-02-16 LAB — POCT PREGNANCY, URINE: Preg Test, Ur: NEGATIVE

## 2018-02-16 LAB — URINE DRUG SCREEN, QUALITATIVE (ARMC ONLY)
Amphetamines, Ur Screen: NOT DETECTED
Barbiturates, Ur Screen: NOT DETECTED
Benzodiazepine, Ur Scrn: POSITIVE — AB
Cannabinoid 50 Ng, Ur ~~LOC~~: NOT DETECTED
Cocaine Metabolite,Ur ~~LOC~~: NOT DETECTED
MDMA (Ecstasy)Ur Screen: NOT DETECTED
Methadone Scn, Ur: POSITIVE — AB
OPIATE, UR SCREEN: NOT DETECTED
Phencyclidine (PCP) Ur S: NOT DETECTED
Tricyclic, Ur Screen: NOT DETECTED

## 2018-02-16 LAB — CHLAMYDIA/NGC RT PCR (ARMC ONLY)
Chlamydia Tr: NOT DETECTED
N GONORRHOEAE: NOT DETECTED

## 2018-02-16 MED ORDER — LIDOCAINE HCL (PF) 1 % IJ SOLN
0.9000 mL | Freq: Once | INTRAMUSCULAR | Status: AC
  Administered 2018-02-17: 0.9 mL
  Filled 2018-02-16: qty 5

## 2018-02-16 MED ORDER — METRONIDAZOLE 500 MG PO TABS
2000.0000 mg | ORAL_TABLET | Freq: Once | ORAL | Status: AC
  Administered 2018-02-17: 2000 mg via ORAL
  Filled 2018-02-16: qty 4

## 2018-02-16 MED ORDER — DOXYCYCLINE HYCLATE 100 MG PO TABS: 100.0000 mg | ORAL_TABLET | Freq: Two times a day (BID) | ORAL | 0 refills | Status: DC

## 2018-02-16 MED ORDER — CEFTRIAXONE SODIUM 250 MG IJ SOLR
250.0000 mg | Freq: Once | INTRAMUSCULAR | Status: AC
  Administered 2018-02-17: 250 mg via INTRAMUSCULAR
  Filled 2018-02-16: qty 250
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
## 2018-02-16 NOTE — ED Notes (Signed)
Dawn, [facility] RN contacted and informed.
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
## 2018-02-16 NOTE — ED Provider Notes (Signed)
Contacted by [facility] nurse regarding patient's allergies.  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  We will give 10 days of doxycycline instead.  She is anaphylactic to penicillin but this does not mean she cannot have ceftriaxone.   Darel Hong, MD 02/16/18 [phone]
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
## 2018-02-17 NOTE — ED Notes (Signed)
Pt discharged by [facility], SANE RN. Pt not taken off of board per that RN. Time of departure does not match discharge time due to this.

## 2018-02-26 ENCOUNTER — Telehealth: Payer: Self-pay | Admitting: Gastroenterology
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
## 2018-03-02 ENCOUNTER — Ambulatory Visit: Payer: Medicaid Other | Admitting: Gastroenterology

## 2018-03-05 ENCOUNTER — Ambulatory Visit: Admit: 2018-03-05 | Payer: Medicaid Other | Admitting: Gastroenterology

## 2018-03-05 SURGERY — ESOPHAGOGASTRODUODENOSCOPY (EGD) WITH PROPOFOL
Anesthesia: General

## 2018-03-18 ENCOUNTER — Other Ambulatory Visit: Payer: Self-pay

## 2018-03-18 ENCOUNTER — Emergency Department: Payer: Medicaid Other

## 2018-03-18 ENCOUNTER — Emergency Department
Admission: EM | Admit: 2018-03-18 | Discharge: 2018-03-18 | Disposition: A | Discharge status: Home / Self Care | Payer: Medicaid Other | Attending: Emergency Medicine | Admitting: Emergency Medicine

## 2018-03-18 DIAGNOSIS — J45909 Unspecified asthma, uncomplicated: Secondary | ICD-10-CM | POA: Diagnosis not present

## 2018-03-18 DIAGNOSIS — F1910 Other psychoactive substance abuse, uncomplicated: Secondary | ICD-10-CM

## 2018-03-18 DIAGNOSIS — Z7982 Long term (current) use of aspirin: Secondary | ICD-10-CM | POA: Insufficient documentation

## 2018-03-18 DIAGNOSIS — T50901A Poisoning by unspecified drugs, medicaments and biological substances, accidental (unintentional), initial encounter: Secondary | ICD-10-CM | POA: Diagnosis not present

## 2018-03-18 DIAGNOSIS — Z91040 Latex allergy status: Secondary | ICD-10-CM | POA: Diagnosis not present

## 2018-03-18 DIAGNOSIS — Z79899 Other long term (current) drug therapy: Secondary | ICD-10-CM | POA: Insufficient documentation

## 2018-03-18 DIAGNOSIS — F329 Major depressive disorder, single episode, unspecified: Secondary | ICD-10-CM | POA: Diagnosis not present

## 2018-03-18 DIAGNOSIS — F331 Major depressive disorder, recurrent, moderate: Secondary | ICD-10-CM | POA: Diagnosis not present

## 2018-03-18 DIAGNOSIS — I2510 Atherosclerotic heart disease of native coronary artery without angina pectoris: Secondary | ICD-10-CM | POA: Insufficient documentation

## 2018-03-18 DIAGNOSIS — I509 Heart failure, unspecified: Secondary | ICD-10-CM | POA: Diagnosis not present

## 2018-03-18 DIAGNOSIS — F419 Anxiety disorder, unspecified: Secondary | ICD-10-CM | POA: Diagnosis not present

## 2018-03-18 HISTORY — DX: Other psychoactive substance abuse, uncomplicated: F19.10

## 2018-03-18 LAB — URINALYSIS, COMPLETE (UACMP) WITH MICROSCOPIC
Bilirubin Urine: NEGATIVE
Glucose, UA: NEGATIVE mg/dL
Hgb urine dipstick: NEGATIVE
Ketones, ur: NEGATIVE mg/dL
Leukocytes, UA: NEGATIVE
Nitrite: NEGATIVE
Protein, ur: NEGATIVE mg/dL
Specific Gravity, Urine: 1.002 — ABNORMAL LOW (ref 1.005–1.030)
pH: 7 (ref 5.0–8.0)

## 2018-03-18 LAB — CBC WITH DIFFERENTIAL/PLATELET
Abs Immature Granulocytes: 0 10*3/uL (ref 0.00–0.07)
Basophils Absolute: 0 10*3/uL (ref 0.0–0.1)
Basophils Relative: 1 %
EOS ABS: 0.1 10*3/uL (ref 0.0–0.5)
Eosinophils Relative: 3 %
HCT: 34.5 % — ABNORMAL LOW (ref 36.0–46.0)
Hemoglobin: 10.3 g/dL — ABNORMAL LOW (ref 12.0–15.0)
Immature Granulocytes: 0 %
Lymphocytes Relative: 43 %
Lymphs Abs: 1 10*3/uL (ref 0.7–4.0)
MCH: 25.5 pg — ABNORMAL LOW (ref 26.0–34.0)
MCHC: 29.9 g/dL — AB (ref 30.0–36.0)
MCV: 85.4 fL (ref 80.0–100.0)
Monocytes Absolute: 0.3 10*3/uL (ref 0.1–1.0)
Monocytes Relative: 12 %
Neutro Abs: 0.9 10*3/uL — ABNORMAL LOW (ref 1.7–7.7)
Neutrophils Relative %: 41 %
Platelets: 104 10*3/uL — ABNORMAL LOW (ref 150–400)
RBC: 4.04 MIL/uL (ref 3.87–5.11)
RDW: 14.8 % (ref 11.5–15.5)
WBC: 2.2 10*3/uL — ABNORMAL LOW (ref 4.0–10.5)
nRBC: 0 % (ref 0.0–0.2)

## 2018-03-18 LAB — COMPREHENSIVE METABOLIC PANEL
ALT: 17 U/L (ref 0–44)
AST: 24 U/L (ref 15–41)
Albumin: 3.8 g/dL (ref 3.5–5.0)
Alkaline Phosphatase: 80 U/L (ref 38–126)
Anion gap: 6 (ref 5–15)
BUN: 13 mg/dL (ref 6–20)
CO2: 24 mmol/L (ref 22–32)
Calcium: 9 mg/dL (ref 8.9–10.3)
Chloride: 112 mmol/L — ABNORMAL HIGH (ref 98–111)
Creatinine, Ser: 0.71 mg/dL (ref 0.44–1.00)
GFR calc Af Amer: >60 mL/min (ref >60)
GFR calc non Af Amer: >60 mL/min (ref >60)
GLUCOSE: 90 mg/dL (ref 70–99)
Potassium: 3.7 mmol/L (ref 3.5–5.1)
Sodium: 142 mmol/L (ref 135–145)
TOTAL PROTEIN: 6.7 g/dL (ref 6.5–8.1)
Total Bilirubin: 0.5 mg/dL (ref 0.3–1.2)

## 2018-03-18 LAB — URINE DRUG SCREEN, QUALITATIVE (ARMC ONLY)
Amphetamines, Ur Screen: NOT DETECTED
Barbiturates, Ur Screen: NOT DETECTED
Benzodiazepine, Ur Scrn: NOT DETECTED
COCAINE METABOLITE, UR ~~LOC~~: NOT DETECTED
Cannabinoid 50 Ng, Ur ~~LOC~~: NOT DETECTED
MDMA (Ecstasy)Ur Screen: NOT DETECTED
Methadone Scn, Ur: POSITIVE — AB
Opiate, Ur Screen: NOT DETECTED
Phencyclidine (PCP) Ur S: NOT DETECTED
TRICYCLIC, UR SCREEN: NOT DETECTED

## 2018-03-18 LAB — PROTIME-INR
INR: 1.16
Prothrombin Time: 14.7 seconds (ref 11.4–15.2)

## 2018-03-18 LAB — SALICYLATE LEVEL: Salicylate Lvl: <7 mg/dL (ref 2.8–30.0)

## 2018-03-18 LAB — AMMONIA: Ammonia: 34 umol/L (ref 9–35)

## 2018-03-18 LAB — ACETAMINOPHEN LEVEL: Acetaminophen (Tylenol), Serum: <10 ug/mL — ABNORMAL LOW (ref 10–30)

## 2018-03-18 LAB — ETHANOL: Alcohol, Ethyl (B): <10 mg/dL (ref <10)

## 2018-03-18 MED ORDER — SODIUM CHLORIDE 0.9 % IV BOLUS
1000.0000 mL | Freq: Once | INTRAVENOUS | Status: AC
  Administered 2018-03-18: 1000 mL via INTRAVENOUS

## 2018-03-18 MED ORDER — NALOXONE HCL 2 MG/2ML IJ SOSY
0.4000 mg | PREFILLED_SYRINGE | Freq: Once | INTRAMUSCULAR | Status: AC
  Administered 2018-03-18: 0.4 mg via INTRAVENOUS

## 2018-03-18 MED ORDER — SODIUM CHLORIDE 0.9 % IV BOLUS
500.0000 mL | Freq: Once | INTRAVENOUS | Status: AC
  Administered 2018-03-18: 500 mL via INTRAVENOUS

## 2018-03-18 NOTE — Discharge Instructions (Signed)
Do not take any extra medications except for as prescribed.  Return to the emergency room for any new or worrisome symptoms.

## 2018-03-18 NOTE — ED Notes (Signed)
Call to emergency contact and he cannot come pick up pt. He doesn't know any other options
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
## 2018-03-18 NOTE — ED Notes (Signed)
Pt given sandwich tray and ginger ale at this time.   

## 2018-03-18 NOTE — ED Notes (Signed)
Pt not answering or responding to question at this time, keeps nodding off when this RN speaking to pt

## 2018-03-18 NOTE — ED Notes (Signed)
Pt given water and ginger ale per MD request. Pt did well, not nausea and is alert.
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
## 2018-03-18 NOTE — ED Notes (Addendum)
Pt more alert at this time. Able to answer questions fully, requesting more cover. Pt becoming argumentative and refusing to put BP cuff on until she receives a new warm blanket. Pt states she took 3 extra xiprexa, which she states is a mood stabilizer. Pt states "I just wanted to sleep, I didn't sleep well, I was up until 630 this morning". Pt denies any thoughts of self harm

## 2018-03-18 NOTE — ED Notes (Signed)
Pt states she has no ride. Call to [facility] with no answer [place], [address] [place], [place] [address] Phone: [phone]

## 2018-03-18 NOTE — ED Triage Notes (Signed)
Pt arrived via ems from home with reports of possible overdose. Ems reports hypotension prior to arrival  Ems states pt went to methadone clinic for dose, came home, family found pt on couch very lethargic. Family thinks pt intentionally overdosed. Ems states unsure of what pt took. Pt only admits to methadone. Able to follow commands on arrival, speech mumbled, and very lethargic and pale. MD at bedside. ems reports hx liver failure
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
## 2018-03-19 LAB — URINE CULTURE: CULTURE: NO GROWTH

## 2018-04-22 ENCOUNTER — Ambulatory Visit: Payer: Self-pay | Admitting: Family Medicine

## 2018-06-11 ENCOUNTER — Ambulatory Visit: Payer: Self-pay | Admitting: Family Medicine

## 2018-06-17 ENCOUNTER — Ambulatory Visit (INDEPENDENT_AMBULATORY_CARE_PROVIDER_SITE_OTHER): Payer: Medicaid Other | Admitting: Family Medicine

## 2018-06-17 ENCOUNTER — Other Ambulatory Visit: Payer: Self-pay

## 2018-06-17 ENCOUNTER — Encounter: Payer: Self-pay | Admitting: Family Medicine

## 2018-06-17 DIAGNOSIS — F331 Major depressive disorder, recurrent, moderate: Secondary | ICD-10-CM

## 2018-06-17 DIAGNOSIS — R945 Abnormal results of liver function studies: Secondary | ICD-10-CM

## 2018-06-17 DIAGNOSIS — Z9189 Other specified personal risk factors, not elsewhere classified: Secondary | ICD-10-CM

## 2018-06-17 DIAGNOSIS — D61818 Other pancytopenia: Secondary | ICD-10-CM

## 2018-06-17 DIAGNOSIS — R760 Raised antibody titer: Secondary | ICD-10-CM

## 2018-06-17 DIAGNOSIS — Z8673 Personal history of transient ischemic attack (TIA), and cerebral infarction without residual deficits: Secondary | ICD-10-CM

## 2018-06-17 DIAGNOSIS — F1120 Opioid dependence, uncomplicated: Secondary | ICD-10-CM

## 2018-06-17 DIAGNOSIS — Z7901 Long term (current) use of anticoagulants: Secondary | ICD-10-CM

## 2018-06-17 DIAGNOSIS — I2510 Atherosclerotic heart disease of native coronary artery without angina pectoris: Secondary | ICD-10-CM

## 2018-06-17 DIAGNOSIS — F603 Borderline personality disorder: Secondary | ICD-10-CM

## 2018-06-17 DIAGNOSIS — R7989 Other specified abnormal findings of blood chemistry: Secondary | ICD-10-CM

## 2018-06-17 DIAGNOSIS — R7309 Other abnormal glucose: Secondary | ICD-10-CM

## 2018-06-17 DIAGNOSIS — Z9884 Bariatric surgery status: Secondary | ICD-10-CM

## 2018-06-17 DIAGNOSIS — F419 Anxiety disorder, unspecified: Secondary | ICD-10-CM

## 2018-06-17 DIAGNOSIS — Z13220 Encounter for screening for lipoid disorders: Secondary | ICD-10-CM

## 2018-06-17 DIAGNOSIS — Z86711 Personal history of pulmonary embolism: Secondary | ICD-10-CM

## 2018-06-17 DIAGNOSIS — F1190 Opioid use, unspecified, uncomplicated: Secondary | ICD-10-CM

## 2018-06-17 MED ORDER — BLOOD GLUCOSE MONITOR KIT: PACK | 0 refills | Status: AC

## 2018-06-17 NOTE — Progress Notes (Signed)
Name: Leslie Marks   MRN: [id number]    DOB: February 13, 1971   Date:06/17/2018       Progress Note  Subjective  Chief Complaint  Chief Complaint  Patient presents with  . Establish Care  . Bleeding/Bruising  . Hepatic Disease    liver failure per  Dr.Anna  . Medication Refill    I connected with  NAMYA VOGES  on 06/17/18 at  9:00 AM EDT by a video enabled telemedicine application and verified that I am speaking with the correct person using two identifiers.  I discussed the limitations of evaluation and management by telemedicine and the availability of in person appointments. The patient expressed understanding and agreed to proceed. Staff also discussed with the patient that there may be a patient responsible charge related to this service. Patient Location: Home Provider Location: Home Additional Individuals present: None  HPI  Pt presents to establish care and with the following concerns:  Lupus/Recurrent thrombotic disorder: Taking anticoagulation and needs referral to hematologist.  Has family history of the same.  Taking Lovenox injections - 80mg  BID. She has easy bruising.  We will refer to hematologist today and check CBC.  Liver Disease: Last visit with Dr. Marius Ditch was November 2019.  Had liver biopsy done in December 2019 which was normal, and she was told she was in liver failure.  Has easy bruising.  She does have occasional abdominal bloating and BLE edema - none currently.  Endorses nausea, diarrhea, and pale stool.  No scleral icterus, no jaundice.   Abnormal BG's: She also notes "enlarged" pancreas - she notes that she has low blood sugars, and review of prior labs does show tendency for hypoglycemia.  She is insistant that she needs insulin and glucagon because of a history of low BG's in the hospital.  Denies polyphagia, polyuria; does have mild polydipsia. We will check labs and proceed from there.  Hx Bypass: Heaviest was 401lbs, down in the 180's now.  She does  exercise some, and diet is fair.  History of CVA:  She reports history of stroke in 2017 and 2019.  Taking lovenox.  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  No other deficits noted. She has not seen neurology, though she was referred by prior PCP in October 2019.  We will refer again today.  Depression, Anxiety:  Seeing [facility] - has an ACT team.  She is taking trintellix, ambien, trazodone.  She requests Xyprexa and clonazepam from myself; advised of our office policy against ongoing benzo's and that she must obtain care through her ACT team.  Discussed her history of overdose in the past and history of polysubstance abuse - she adamantly denies history of substance abuse.  Review of Prior UDS shows consistent use of Opiates/Methadone, some benzo's and amphetamines - all may have been Rx'd to her by psychiatry at some point, but it is unclear.  Will defer to ACT team for care at this time.  Atherosclerotic Heart disease: Taking propanolol, lasix 20mg , 81mg  ASA, and daily lovenox injections. Denies chest pain or shortness of breath.  Rx refills: Patient states she needs refills, but is unable to tell which medications and refuses to go through the list with me stating "the person that got be ready already knows".  Explained process, and she continues to refuse.  Advised she may call pharmacy to have them send requests if needed.  Patient Active  Problem List   Diagnosis Date Noted  . Polysubstance abuse ([place]) 03/18/2018  . Opiate overdose ([place]) 03/12/2016  . Generalized abdominal pain   . Opioid dependence ([place]) 04/20/2015  . Abnormal LFTs (liver function tests) 11/20/2014  . Benzodiazepine overdose 09/30/2014  . 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 Malabsorption syndrome 05/27/2013  . Lupus anticoagulant positive 04/29/2013  . 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 Pulmonary  emboli ([place]) 04/27/2013  . Chronic pain syndrome 04/27/2013  . Chronic anxiety 04/27/2013  . Depression 04/26/2013  . Chronic anticoagulation 04/26/2013  . Pancytopenia ([place]) 04/26/2013  . B12 deficiency 11/16/2010    Past Surgical History:  Procedure Laterality Date  . ABDOMINAL HYSTERECTOMY    . APPENDECTOMY    . CERVICAL CERCLAGE    . CESAREAN SECTION    . CHOLECYSTECTOMY    . GASTRIC BYPASS  2003  . HERNIA REPAIR    . IVC FILTER INSERTION    . RESECTION SMALL BOWEL / CLOSURE ILEOSTOMY    . TONSILLECTOMY      Family History  Problem Relation Age of Onset  . Hypertension Brother   . Anxiety disorder Brother   . Asthma Brother   . Bipolar disorder Brother   . High blood pressure Brother   . High blood pressure Mother   . Heart attack Mother   . Anxiety disorder Mother   . Bipolar disorder Mother   . Bipolar disorder Father   . Clotting disorder Father   . Diabetes Father   . Post-traumatic stress disorder Father   . Clotting disorder Maternal Grandmother   . Clotting disorder Paternal Grandfather   . Clotting disorder Paternal Aunt   . High blood pressure Paternal Uncle   . Bipolar disorder Son   . Heart failure Neg Hx     Social History   Socioeconomic History  . Marital status: Single    Spouse name: Not on file  . Number of children: 3  . Years of education: Not on file  . Highest education level: Not on file  Occupational History  . Occupation: disabled  Social Needs  . Financial resource strain: Very hard  . Food insecurity:    Worry: Sometimes true    Inability: Sometimes true  . Transportation needs:    Medical: Yes    Non-medical: Yes  Tobacco Use  . Smoking status: Never Smoker  . Smokeless tobacco: Never Used  Substance and Sexual Activity  . Alcohol use: No  . Drug use: No  . Sexual activity: Not on file  Lifestyle  . Physical activity:    Days per week: 2 days    Minutes per session: 30 min  . Stress: To some extent   Relationships  . Social connections:    Talks on phone: More than three times a week    Gets together: More than three times a week    Attends religious service: Never    Active member of club or organization: No    Attends meetings of clubs or organizations: Never    Relationship status: Never married  . Intimate partner violence:    Fear of current or ex partner: No    Emotionally abused: No    Physically abused: No    Forced sexual activity: No  Other Topics Concern  . Not on file  Social History Narrative  . Not on file     Current Outpatient Medications:  .  albuterol (PROVENTIL  HFA;VENTOLIN HFA) 108 (90 Base) MCG/ACT inhaler, Inhale 2 puffs into the lungs every 4 (four) hours as needed for wheezing or shortness of breath., Disp: , Rfl:  .  aspirin 81 MG chewable tablet, Chew by mouth., Disp: , Rfl:  .  buPROPion (WELLBUTRIN XL) 150 MG 24 hr tablet, Take by mouth., Disp: , Rfl:  .  cyanocobalamin (,VITAMIN B-12,) 1000 MCG/ML injection, Inject into the muscle., Disp: , Rfl:  .  diclofenac sodium (VOLTAREN) 1 % GEL, Apply topically., Disp: , Rfl:  .  doxycycline (VIBRA-TABS) 100 MG tablet, Take 1 tablet (100 mg total) by mouth 2 (two) times daily., Disp: 20 tablet, Rfl: 0 .  enoxaparin (LOVENOX) 80 MG/0.8ML injection, Inject into the skin., Disp: , Rfl:  .  esomeprazole (NEXIUM) 40 MG capsule, Take by mouth., Disp: , Rfl:  .  Estradiol 10 MCG TABS vaginal tablet, Place vaginally., Disp: , Rfl:  .  fluticasone (FLONASE) 50 MCG/ACT nasal spray, Place into the nose., Disp: , Rfl:  .  furosemide (LASIX) 20 MG tablet, Take by mouth., Disp: , Rfl:  .  glucose blood (PRECISION QID TEST) test strip, 6 x day ICD-10-CM R73.09 E16.2, Disp: , Rfl:  .  glucose blood (PRECISION QID TEST) test strip, 6 x day ICD-10-CM R73.09 E16.2, Disp: , Rfl:  .  Lancets Misc. (UNISTIK 2 NORMAL) MISC, 6 x per day; Accu-Check softclix lancets ICD-10-CM  R73.09 E16.2, Disp: , Rfl:  .  naloxone (NARCAN) 0.4  MG/ML injection, To be used as needed for overdose, Disp: 1 mL, Rfl: 1 .  OLANZapine (ZYPREXA) 20 MG tablet, Take 40 mg by mouth at bedtime. , Disp: , Rfl:  .  ondansetron (ZOFRAN) 8 MG tablet, Take 8 mg by mouth 3 (three) times daily., Disp: , Rfl:  .  Oxycodone HCl 10 MG TABS, Take 10 mg by mouth every 6 (six) hours. rx was written for q 5-6 h with max of 5 tabs qd, Disp: , Rfl:  .  pregabalin (LYRICA) 100 MG capsule, Take 200 mg by mouth 2 (two) times daily. , Disp: , Rfl:  .  promethazine (PHENERGAN) 12.5 MG tablet, Take by mouth., Disp: , Rfl:  .  propranolol (INDERAL) 40 MG tablet, Take 40 mg by mouth 3 (three) times daily., Disp: , Rfl:  .  tapentadol (NUCYNTA) 50 MG tablet, Take 75 mg by mouth. , Disp: , Rfl:  .  Tapentadol HCl 200 MG TB12, Take 1 tablet by mouth 2 (two) times daily., Disp: , Rfl:  .  tiZANidine (ZANAFLEX) 4 MG tablet, Take 4 mg by mouth 2 (two) times daily. Per pharmacy, rx was written for 1 bid, Disp: , Rfl:  .  topiramate (TOPAMAX) 100 MG tablet, Take 100 mg by mouth daily. , Disp: , Rfl:  .  traZODone (DESYREL) 150 MG tablet, Take 300 mg by mouth at bedtime., Disp: , Rfl:  .  vortioxetine HBr (TRINTELLIX) 20 MG TABS tablet, Take by mouth., Disp: , Rfl:  .  zolpidem (AMBIEN) 10 MG tablet, Take 5-10 mg by mouth at bedtime as needed for sleep. , Disp: , Rfl:  .  acetaminophen (TYLENOL) 325 MG tablet, Take by mouth., Disp: , Rfl:  .  doxepin (SINEQUAN) 10 MG capsule, Take by mouth., Disp: , Rfl:  .  gabapentin (NEURONTIN) 600 MG tablet, Take 1,200 mg by mouth 3 (three) times daily. , Disp: , Rfl:  .  traZODone (DESYREL) 100 MG tablet, Take by mouth., Disp: , Rfl:   Allergies  Allergen Reactions  . Amoxicillin Anaphylaxis  . Augmentin [Amoxicillin-Pot Clavulanate] Anaphylaxis  . Betadine [Povidone Iodine] Anaphylaxis  . Ciprofloxacin Anaphylaxis  . Erythromycin Anaphylaxis  . Latex Anaphylaxis  . Penicillins Anaphylaxis    Has patient had a PCN reaction causing  immediate rash, facial/tongue/throat swelling, SOB or lightheadedness with hypotension: yes Has patient had a PCN reaction causing severe rash involving mucus membranes or skin necrosis: no Has patient had a PCN reaction that required hospitalization no Has patient had a PCN reaction occurring within the last 10 years: no If all of the above answers are "NO", then may proceed with Cephalosporin use.   . Adhesive [Tape] Other (See Comments)    Skin "bubbles" and blisters  . Lisinopril Cough    I personally reviewed active problem list, medication list, allergies, family history, social history, notes from last encounter, lab results with the patient/caregiver today.   ROS Ten systems reviewed and is negative except as mentioned in HPI  Objective  Virtual encounter, vitals not obtained.  There is no height or weight on file to calculate BMI.  Physical Exam Constitutional: Patient appears well-developed and well-nourished. No distress.  HENT: Head: Normocephalic and atraumatic.  Neck: Normal range of motion. Pulmonary/Chest: Effort normal. No respiratory distress. Speaking in complete sentences Neurological: Pt is alert and oriented to person, place, and time. Coordination, speech and gait are normal.  Psychiatric: Patient has a normal mood and affect. behavior is normal. Judgment and thought content normal.  No results found for this or any previous visit (from the past 72 hour(s)).  PHQ2/9: Depression screen PHQ 2/9 06/17/2018  Decreased Interest 1  Down, Depressed, Hopeless 1  PHQ - 2 Score 2  Altered sleeping 3  Tired, decreased energy 3  Change in appetite 2  Feeling bad or failure about yourself  0  Trouble concentrating 1  Moving slowly or fidgety/restless 1  Suicidal thoughts 0  PHQ-9 Score 12  Difficult doing work/chores Very difficult   PHQ-2/9 Result is negative.    Fall Risk: Fall Risk  06/17/2018  Falls in the past year? 1  Number falls in past yr: 1   Injury with Fall? 1  Risk for fall due to : History of fall(s);Impaired balance/gait;Impaired mobility  Follow up Falls evaluation completed    Assessment & Plan  1. Lupus anticoagulant positive - Continue lovenox for now; does have easy bruising, discussed s&sx of bleeding. - Ambulatory referral to Hematology - CBC w/Diff/Platelet  2. Pancytopenia ([place]) - Ambulatory referral to Hematology - CBC w/Diff/Platelet  3. Chronic anticoagulation - Ambulatory referral to Hematology - CBC w/Diff/Platelet  4. Abnormal LFTs (liver function tests) - COMPLETE METABOLIC PANEL WITH GFR - Ambulatory referral to Gastroenterology - CBC w/Diff/Platelet  5. Abnormal glucose level - Hemoglobin A1c - Has history of "non-diabetic hypoglycemia".  Will evaluate fasting labs and A1C to determine best course of action; she will also check fasting BG's at home with glucometer and keep log for follow up  6. History of Roux-en-Y gastric bypass - Stable, consider additional testing for B12 and vitamin D at follow up  7. History of CVA (cerebrovascular accident) - Taking ASA and lovenox - Ambulatory referral to Neurology  8. Moderate episode of recurrent major depressive disorder ([place]) - ACT team  9. Chronic anxiety ACT team  10. Lipid screening - Lipid panel  11. History of drug overdose - Discussed at length, ACT Team follow up  12. 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 Borderline personality disorder ([place]) ACT team  14. Atherosclerosis of native coronary artery of native heart without angina pectoris - Lipid panel, Lovenox, ASA 81mg   15. History of pulmonary embolism - Lovenox, ASA 81mg .  This patient is quite complex with several unresolved medical issues that it appears she has failed to follow up on with former PCP and/or specialists that prior PCP referred her to.  She is aware that we will  address additional concerns and chronic problems at her close follow up as we addresse the most pressing matters today.  I discussed the assessment and treatment plan with the patient. The patient was provided an opportunity to ask questions and all were answered. The patient agreed with the plan and demonstrated an understanding of the instructions.  The patient was advised to call back or seek an in-person evaluation if the symptoms worsen or if the condition fails to improve as anticipated.  I provided 43 minutes of non-face-to-face time during this encounter.

## 2018-06-17 NOTE — Patient Instructions (Signed)
Please keep a log of your blood sugars once daily in the morning before eating, and may check if you are feeling weak or like your blood sugar is low throughout the day.

## 2018-06-23 ENCOUNTER — Other Ambulatory Visit: Payer: Self-pay | Admitting: *Deleted

## 2018-06-23 ENCOUNTER — Telehealth: Payer: Self-pay | Admitting: *Deleted

## 2018-06-23 ENCOUNTER — Encounter: Payer: Self-pay | Admitting: Oncology

## 2018-06-23 ENCOUNTER — Other Ambulatory Visit: Payer: Self-pay

## 2018-06-23 ENCOUNTER — Inpatient Hospital Stay: Payer: Medicaid Other | Attending: Oncology | Admitting: Oncology

## 2018-06-23 DIAGNOSIS — Z7901 Long term (current) use of anticoagulants: Secondary | ICD-10-CM | POA: Diagnosis not present

## 2018-06-23 DIAGNOSIS — I2782 Chronic pulmonary embolism: Secondary | ICD-10-CM | POA: Diagnosis not present

## 2018-06-23 DIAGNOSIS — E538 Deficiency of other specified B group vitamins: Secondary | ICD-10-CM | POA: Insufficient documentation

## 2018-06-23 DIAGNOSIS — D509 Iron deficiency anemia, unspecified: Secondary | ICD-10-CM | POA: Insufficient documentation

## 2018-06-23 DIAGNOSIS — O019 Hydatidiform mole, unspecified: Secondary | ICD-10-CM

## 2018-06-23 DIAGNOSIS — D61818 Other pancytopenia: Secondary | ICD-10-CM

## 2018-06-23 DIAGNOSIS — Z79899 Other long term (current) drug therapy: Secondary | ICD-10-CM | POA: Insufficient documentation

## 2018-06-23 NOTE — Progress Notes (Signed)
Pt has multiple issues I her health. She does have issues with blood clots and on chronic anticoagulation. She has  A mole that has grown over 1 1/2 years and is sore an big she states. She has hx of multiple  people in  Her family with clots

## 2018-06-23 NOTE — Telephone Encounter (Signed)
I called to [facility] would make an appt for pt. With medicaid and large mole that has grown over 1 1/2 years and is painful.  They do accept medicaid and because she has medicaid. The referral has to come from PCP office. The info should be faxed to [phone]. I called the PCP office at [facility] and spoke with staff and the office closes at 4 pm and she would send my message to MD and staff and have them call me back tom. About this issue.

## 2018-06-24 ENCOUNTER — Telehealth: Payer: Self-pay

## 2018-06-24 ENCOUNTER — Other Ambulatory Visit: Payer: Self-pay

## 2018-06-24 ENCOUNTER — Inpatient Hospital Stay: Payer: Medicaid Other

## 2018-06-24 DIAGNOSIS — Z79899 Other long term (current) drug therapy: Secondary | ICD-10-CM | POA: Diagnosis not present

## 2018-06-24 DIAGNOSIS — D61818 Other pancytopenia: Secondary | ICD-10-CM

## 2018-06-24 DIAGNOSIS — D509 Iron deficiency anemia, unspecified: Secondary | ICD-10-CM | POA: Diagnosis not present

## 2018-06-24 DIAGNOSIS — E538 Deficiency of other specified B group vitamins: Secondary | ICD-10-CM | POA: Diagnosis not present

## 2018-06-24 LAB — IRON AND TIBC
Iron: 36 ug/dL (ref 28–170)
Saturation Ratios: 8 % — ABNORMAL LOW (ref 10.4–31.8)
TIBC: 428 ug/dL (ref 250–450)
UIBC: 392 ug/dL

## 2018-06-24 LAB — RETICULOCYTES
Immature Retic Fract: 7.6 % (ref 2.3–15.9)
RBC.: 4.05 MIL/uL (ref 3.87–5.11)
Retic Count, Absolute: 41.3 10*3/uL (ref 19.0–186.0)
Retic Ct Pct: 1 % (ref 0.4–3.1)

## 2018-06-24 LAB — COMPREHENSIVE METABOLIC PANEL
ALT: 14 U/L (ref 0–44)
AST: 21 U/L (ref 15–41)
Albumin: 3.7 g/dL (ref 3.5–5.0)
Alkaline Phosphatase: 112 U/L (ref 38–126)
Anion gap: 7 (ref 5–15)
BUN: 13 mg/dL (ref 6–20)
CO2: 23 mmol/L (ref 22–32)
Calcium: 8.6 mg/dL — ABNORMAL LOW (ref 8.9–10.3)
Chloride: 110 mmol/L (ref 98–111)
Creatinine, Ser: 1 mg/dL (ref 0.44–1.00)
GFR calc Af Amer: >60 mL/min (ref >60)
GFR calc non Af Amer: >60 mL/min (ref >60)
Glucose, Bld: 65 mg/dL — ABNORMAL LOW (ref 70–99)
Potassium: 3.3 mmol/L — ABNORMAL LOW (ref 3.5–5.1)
Sodium: 140 mmol/L (ref 135–145)
Total Bilirubin: 0.5 mg/dL (ref 0.3–1.2)
Total Protein: 7 g/dL (ref 6.5–8.1)

## 2018-06-24 LAB — CBC WITH DIFFERENTIAL/PLATELET
Abs Immature Granulocytes: 0.01 10*3/uL (ref 0.00–0.07)
Basophils Absolute: 0 10*3/uL (ref 0.0–0.1)
Basophils Relative: 0 %
Eosinophils Absolute: 0 10*3/uL (ref 0.0–0.5)
Eosinophils Relative: 2 %
HCT: 36.6 % (ref 36.0–46.0)
Hemoglobin: 11.2 g/dL — ABNORMAL LOW (ref 12.0–15.0)
Immature Granulocytes: 0 %
Lymphocytes Relative: 36 %
Lymphs Abs: 0.9 10*3/uL (ref 0.7–4.0)
MCH: 27.7 pg (ref 26.0–34.0)
MCHC: 30.6 g/dL (ref 30.0–36.0)
MCV: 90.4 fL (ref 80.0–100.0)
Monocytes Absolute: 0.2 10*3/uL (ref 0.1–1.0)
Monocytes Relative: 9 %
Neutro Abs: 1.2 10*3/uL — ABNORMAL LOW (ref 1.7–7.7)
Neutrophils Relative %: 53 %
Platelets: 102 10*3/uL — ABNORMAL LOW (ref 150–400)
RBC: 4.05 MIL/uL (ref 3.87–5.11)
RDW: 15.5 % (ref 11.5–15.5)
WBC: 2.4 10*3/uL — ABNORMAL LOW (ref 4.0–10.5)
nRBC: 0 % (ref 0.0–0.2)

## 2018-06-24 LAB — PATHOLOGIST SMEAR REVIEW

## 2018-06-24 LAB — TSH: TSH: 1.237 u[IU]/mL (ref 0.350–4.500)

## 2018-06-24 LAB — FERRITIN: Ferritin: 6 ng/mL — ABNORMAL LOW (ref 11–307)

## 2018-06-24 LAB — VITAMIN B12: Vitamin B-12: 174 pg/mL — ABNORMAL LOW (ref 180–914)

## 2018-06-24 LAB — FOLATE: Folate: 29 ng/mL (ref >5.9)

## 2018-06-24 NOTE — Telephone Encounter (Signed)
Copied from [place] [phone]. Topic: General - Other >> Jun 23, 2018  4:33 PM Rainey Pines A wrote: Reason for CRM: Judeen Hammans from [facility] stated that patients needs a referral from her PCP. Patient has a mole growing on her back for over a year. Judeen Hammans would like a callback at [phone]

## 2018-06-24 NOTE — Telephone Encounter (Signed)
I will need to take a look at the mole first.  This was not discussed in her initial visit.  She can come in for an appointment on Thurs/Fri if she is open to possibly removing it in our office.  Otherwise, I can discuss over video and then send referral.

## 2018-06-24 NOTE — Telephone Encounter (Signed)
appt made with [place]

## 2018-06-25 ENCOUNTER — Encounter: Payer: Self-pay | Admitting: Family Medicine

## 2018-06-25 ENCOUNTER — Ambulatory Visit (INDEPENDENT_AMBULATORY_CARE_PROVIDER_SITE_OTHER): Payer: Self-pay | Admitting: Gastroenterology

## 2018-06-25 ENCOUNTER — Encounter: Payer: Self-pay | Admitting: Oncology

## 2018-06-25 ENCOUNTER — Other Ambulatory Visit: Payer: Self-pay | Admitting: Family Medicine

## 2018-06-25 DIAGNOSIS — Z1389 Encounter for screening for other disorder: Secondary | ICD-10-CM

## 2018-06-25 DIAGNOSIS — K529 Noninfective gastroenteritis and colitis, unspecified: Secondary | ICD-10-CM

## 2018-06-25 DIAGNOSIS — R739 Hyperglycemia, unspecified: Secondary | ICD-10-CM | POA: Insufficient documentation

## 2018-06-25 LAB — COMPLETE METABOLIC PANEL WITH GFR
AG Ratio: 1.5 (calc) (ref 1.0–2.5)
ALT: 12 U/L (ref 6–29)
AST: 16 U/L (ref 10–35)
Albumin: 3.7 g/dL (ref 3.6–5.1)
Alkaline phosphatase (APISO): 114 U/L (ref 31–125)
BUN: 11 mg/dL (ref 7–25)
CO2: 20 mmol/L (ref 20–32)
Calcium: 8.6 mg/dL (ref 8.6–10.2)
Chloride: 109 mmol/L (ref 98–110)
Creat: 0.94 mg/dL (ref 0.50–1.10)
GFR, Est African American: 85 mL/min/{1.73_m2} (ref >=60)
GFR, Est Non African American: 73 mL/min/{1.73_m2} (ref >=60)
Globulin: 2.4 g/dL (calc) (ref 1.9–3.7)
Glucose, Bld: 250 mg/dL — ABNORMAL HIGH (ref 65–99)
Potassium: 3.4 mmol/L — ABNORMAL LOW (ref 3.5–5.3)
Sodium: 139 mmol/L (ref 135–146)
Total Bilirubin: 0.3 mg/dL (ref 0.2–1.2)
Total Protein: 6.1 g/dL (ref 6.1–8.1)

## 2018-06-25 LAB — ANA COMPREHENSIVE PANEL
Anti JO-1: <0.2 AI (ref 0.0–0.9)
Centromere Ab Screen: <0.2 AI (ref 0.0–0.9)
Chromatin Ab SerPl-aCnc: <0.2 AI (ref 0.0–0.9)
ENA SM Ab Ser-aCnc: <0.2 AI (ref 0.0–0.9)
Ribonucleic Protein: <0.2 AI (ref 0.0–0.9)
SSA (Ro) (ENA) Antibody, IgG: <0.2 AI (ref 0.0–0.9)
SSB (La) (ENA) Antibody, IgG: <0.2 AI (ref 0.0–0.9)
Scleroderma (Scl-70) (ENA) Antibody, IgG: <0.2 AI (ref 0.0–0.9)
ds DNA Ab: 1 IU/mL (ref 0–9)

## 2018-06-25 LAB — CBC WITH DIFFERENTIAL/PLATELET
Absolute Monocytes: 113 cells/uL — ABNORMAL LOW (ref 200–950)
Basophils Absolute: 11 cells/uL (ref 0–200)
Basophils Relative: 0.6 %
Eosinophils Absolute: 31 cells/uL (ref 15–500)
Eosinophils Relative: 1.7 %
HCT: 34.6 % — ABNORMAL LOW (ref 35.0–45.0)
Hemoglobin: 10.9 g/dL — ABNORMAL LOW (ref 11.7–15.5)
Lymphs Abs: 648 cells/uL — ABNORMAL LOW (ref 850–3900)
MCH: 27.9 pg (ref 27.0–33.0)
MCHC: 31.5 g/dL — ABNORMAL LOW (ref 32.0–36.0)
MCV: 88.5 fL (ref 80.0–100.0)
MPV: 11.5 fL (ref 7.5–12.5)
Monocytes Relative: 6.3 %
Neutro Abs: 997 cells/uL — ABNORMAL LOW (ref 1500–7800)
Neutrophils Relative %: 55.4 %
Platelets: 127 10*3/uL — ABNORMAL LOW (ref 140–400)
RBC: 3.91 10*6/uL (ref 3.80–5.10)
RDW: 14.5 % (ref 11.0–15.0)
Total Lymphocyte: 36 %
WBC: 1.8 10*3/uL — ABNORMAL LOW (ref 3.8–10.8)

## 2018-06-25 LAB — HEMOGLOBIN A1C
Hgb A1c MFr Bld: 4.9 % of total Hgb (ref <5.7)
Mean Plasma Glucose: 94 (calc)
eAG (mmol/L): 5.2 (calc)

## 2018-06-25 LAB — LIPID PANEL
Cholesterol: 150 mg/dL (ref <200)
HDL: 51 mg/dL (ref >=50)
LDL Cholesterol (Calc): 84 mg/dL (calc)
Non-HDL Cholesterol (Calc): 99 mg/dL (calc) (ref <130)
Total CHOL/HDL Ratio: 2.9 (calc) (ref <5.0)
Triglycerides: 71 mg/dL (ref <150)

## 2018-06-25 LAB — HIV ANTIBODY (ROUTINE TESTING W REFLEX): HIV Screen 4th Generation wRfx: NONREACTIVE

## 2018-06-25 LAB — HEPATITIS C ANTIBODY: HCV Ab: <0.1 s/co ratio (ref 0.0–0.9)
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
## 2018-06-26 ENCOUNTER — Ambulatory Visit: Payer: Self-pay | Admitting: Family Medicine

## 2018-06-26 LAB — COMP PANEL: LEUKEMIA/LYMPHOMA

## 2018-06-30 ENCOUNTER — Telehealth: Payer: Self-pay

## 2018-06-30 NOTE — Telephone Encounter (Signed)
I contacted [facility] and spoke to [facility] regarding dermatology referral for patient. Per Neoma Laming, referral to dermatology was placed by their office.

## 2018-07-01 ENCOUNTER — Ambulatory Visit (INDEPENDENT_AMBULATORY_CARE_PROVIDER_SITE_OTHER): Payer: Medicaid Other | Admitting: Gastroenterology

## 2018-07-01 DIAGNOSIS — K529 Noninfective gastroenteritis and colitis, unspecified: Secondary | ICD-10-CM

## 2018-07-01 DIAGNOSIS — D518 Other vitamin B12 deficiency anemias: Secondary | ICD-10-CM | POA: Diagnosis not present

## 2018-07-01 DIAGNOSIS — D509 Iron deficiency anemia, unspecified: Secondary | ICD-10-CM | POA: Diagnosis not present

## 2018-07-01 MED ORDER — CYANOCOBALAMIN 1000 MCG/ML IJ SOLN: 1000.0000 ug | INTRAMUSCULAR | 0 refills | Status: DC
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
## 2018-07-02 ENCOUNTER — Ambulatory Visit: Payer: Medicaid Other | Admitting: Family Medicine

## 2018-07-06 ENCOUNTER — Other Ambulatory Visit: Payer: Self-pay

## 2018-07-07 ENCOUNTER — Inpatient Hospital Stay (HOSPITAL_BASED_OUTPATIENT_CLINIC_OR_DEPARTMENT_OTHER): Payer: Medicaid Other | Admitting: Oncology

## 2018-07-07 DIAGNOSIS — Z79899 Other long term (current) drug therapy: Secondary | ICD-10-CM

## 2018-07-07 DIAGNOSIS — Z7982 Long term (current) use of aspirin: Secondary | ICD-10-CM | POA: Diagnosis not present

## 2018-07-07 DIAGNOSIS — D509 Iron deficiency anemia, unspecified: Secondary | ICD-10-CM

## 2018-07-07 DIAGNOSIS — E538 Deficiency of other specified B group vitamins: Secondary | ICD-10-CM

## 2018-07-07 DIAGNOSIS — D61818 Other pancytopenia: Secondary | ICD-10-CM | POA: Diagnosis not present

## 2018-07-07 NOTE — Progress Notes (Signed)
Pt to get lab results and she is always in pain every day and today she feels bad and rating pain 9 out of 10 and it is allover pain and she takes several pain meds on usual shcedule and she took them today.

## 2018-07-08 ENCOUNTER — Other Ambulatory Visit: Payer: Self-pay

## 2018-07-09 ENCOUNTER — Ambulatory Visit (HOSPITAL_BASED_OUTPATIENT_CLINIC_OR_DEPARTMENT_OTHER): Payer: Medicaid Other | Admitting: Hospice and Palliative Medicine

## 2018-07-09 ENCOUNTER — Emergency Department: Payer: Medicaid Other

## 2018-07-09 ENCOUNTER — Inpatient Hospital Stay: Payer: Medicaid Other

## 2018-07-09 ENCOUNTER — Emergency Department
Admission: EM | Admit: 2018-07-09 | Discharge: 2018-07-09 | Disposition: A | Discharge status: Home / Self Care | Payer: Medicaid Other | Attending: Emergency Medicine | Admitting: Emergency Medicine

## 2018-07-09 ENCOUNTER — Encounter: Payer: Self-pay | Admitting: Emergency Medicine

## 2018-07-09 ENCOUNTER — Other Ambulatory Visit: Payer: Self-pay

## 2018-07-09 DIAGNOSIS — I509 Heart failure, unspecified: Secondary | ICD-10-CM | POA: Insufficient documentation

## 2018-07-09 DIAGNOSIS — F329 Major depressive disorder, single episode, unspecified: Secondary | ICD-10-CM | POA: Insufficient documentation

## 2018-07-09 DIAGNOSIS — R531 Weakness: Secondary | ICD-10-CM | POA: Insufficient documentation

## 2018-07-09 DIAGNOSIS — E538 Deficiency of other specified B group vitamins: Secondary | ICD-10-CM | POA: Diagnosis not present

## 2018-07-09 DIAGNOSIS — D61818 Other pancytopenia: Secondary | ICD-10-CM

## 2018-07-09 DIAGNOSIS — F419 Anxiety disorder, unspecified: Secondary | ICD-10-CM | POA: Insufficient documentation

## 2018-07-09 DIAGNOSIS — Z79899 Other long term (current) drug therapy: Secondary | ICD-10-CM | POA: Insufficient documentation

## 2018-07-09 DIAGNOSIS — Z91040 Latex allergy status: Secondary | ICD-10-CM | POA: Diagnosis not present

## 2018-07-09 DIAGNOSIS — Z9049 Acquired absence of other specified parts of digestive tract: Secondary | ICD-10-CM | POA: Insufficient documentation

## 2018-07-09 DIAGNOSIS — Z8673 Personal history of transient ischemic attack (TIA), and cerebral infarction without residual deficits: Secondary | ICD-10-CM | POA: Diagnosis not present

## 2018-07-09 DIAGNOSIS — Z9884 Bariatric surgery status: Secondary | ICD-10-CM | POA: Diagnosis not present

## 2018-07-09 DIAGNOSIS — R5383 Other fatigue: Secondary | ICD-10-CM | POA: Diagnosis present

## 2018-07-09 DIAGNOSIS — D509 Iron deficiency anemia, unspecified: Secondary | ICD-10-CM | POA: Diagnosis not present

## 2018-07-09 DIAGNOSIS — I2510 Atherosclerotic heart disease of native coronary artery without angina pectoris: Secondary | ICD-10-CM | POA: Diagnosis not present

## 2018-07-09 DIAGNOSIS — Z7982 Long term (current) use of aspirin: Secondary | ICD-10-CM | POA: Insufficient documentation

## 2018-07-09 DIAGNOSIS — J45909 Unspecified asthma, uncomplicated: Secondary | ICD-10-CM | POA: Insufficient documentation

## 2018-07-09 LAB — BASIC METABOLIC PANEL
Anion gap: 8 (ref 5–15)
BUN: 14 mg/dL (ref 6–20)
CO2: 22 mmol/L (ref 22–32)
Calcium: 8.8 mg/dL — ABNORMAL LOW (ref 8.9–10.3)
Chloride: 110 mmol/L (ref 98–111)
Creatinine, Ser: 0.77 mg/dL (ref 0.44–1.00)
GFR calc Af Amer: >60 mL/min (ref >60)
GFR calc non Af Amer: >60 mL/min (ref >60)
Glucose, Bld: 118 mg/dL — ABNORMAL HIGH (ref 70–99)
Potassium: 3.8 mmol/L (ref 3.5–5.1)
Sodium: 140 mmol/L (ref 135–145)

## 2018-07-09 LAB — URINALYSIS, COMPLETE (UACMP) WITH MICROSCOPIC
Bacteria, UA: NONE SEEN
Bilirubin Urine: NEGATIVE
Glucose, UA: NEGATIVE mg/dL
Hgb urine dipstick: NEGATIVE
Ketones, ur: NEGATIVE mg/dL
Leukocytes,Ua: NEGATIVE
Nitrite: NEGATIVE
Protein, ur: NEGATIVE mg/dL
Specific Gravity, Urine: 1.006 (ref 1.005–1.030)
pH: 6 (ref 5.0–8.0)

## 2018-07-09 LAB — CBC
HCT: 37.2 % (ref 36.0–46.0)
Hemoglobin: 11.2 g/dL — ABNORMAL LOW (ref 12.0–15.0)
MCH: 27.7 pg (ref 26.0–34.0)
MCHC: 30.1 g/dL (ref 30.0–36.0)
MCV: 92.1 fL (ref 80.0–100.0)
Platelets: 127 10*3/uL — ABNORMAL LOW (ref 150–400)
RBC: 4.04 MIL/uL (ref 3.87–5.11)
RDW: 14.9 % (ref 11.5–15.5)
WBC: 2.7 10*3/uL — ABNORMAL LOW (ref 4.0–10.5)
nRBC: 0 % (ref 0.0–0.2)

## 2018-07-09 LAB — URINE DRUG SCREEN, QUALITATIVE (ARMC ONLY)
Amphetamines, Ur Screen: NOT DETECTED
Barbiturates, Ur Screen: NOT DETECTED
Benzodiazepine, Ur Scrn: NOT DETECTED
Cannabinoid 50 Ng, Ur ~~LOC~~: NOT DETECTED
Cocaine Metabolite,Ur ~~LOC~~: NOT DETECTED
MDMA (Ecstasy)Ur Screen: NOT DETECTED
Methadone Scn, Ur: POSITIVE — AB
Opiate, Ur Screen: NOT DETECTED
Phencyclidine (PCP) Ur S: NOT DETECTED
Tricyclic, Ur Screen: NOT DETECTED

## 2018-07-09 NOTE — ED Notes (Signed)
Second Phlebotomist attempted to draw blood, but pt refuses to to get "stuck anywhere else except my wrist" or "you can place a midline if you need blood". This RN notified MD Cinda Quest.

## 2018-07-09 NOTE — ED Notes (Signed)
ED Provider Malinda  at bedside. 

## 2018-07-09 NOTE — ED Notes (Signed)
Pt is alert in lobby counting money. Pt is observed counting a large stack of cash repeatedly.

## 2018-07-09 NOTE — ED Notes (Signed)
Pt take to bathroom with ED staff. Pt continues to be alert at this time.

## 2018-07-09 NOTE — ED Notes (Signed)
Patient discharged to home per MD order. Patient in stable condition, and deemed medically cleared by ED provider for discharge. Discharge instructions reviewed with patient/family using "Teach Back"; verbalized understanding of medication education and administration, and information about follow-up care. Denies further concerns. ° °
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
## 2018-07-09 NOTE — ED Notes (Signed)
Lab technician at bedside unable to draw blood at this time. Phlebotomist has been called for blood drawn.

## 2018-07-09 NOTE — ED Notes (Signed)
This RN has contacted the lab for a blood drawn.

## 2018-07-09 NOTE — Discharge Instructions (Addendum)
Please follow-up with your doctor as needed.  Please return here for any further problems or if anything gets worse.

## 2018-07-09 NOTE — ED Triage Notes (Signed)
Pt sent by MD office. MD reports pt went to the bathroom for about 20 minutes and when she came out she was lethargic. Pt with hx of DVT's and is on blood thinners and chronic pain. MD reports he felt she needed to be seen here. Pt slurring speech a little in triage room. Pt denies taking any medications for pain or otherwise while in the [facility].

## 2018-07-09 NOTE — Progress Notes (Signed)
Patient here for feraheme infusion. Upon returning from the restroom patient appeared very sleepy, slurred speech. ALso stated she was in a lot of pain. Notified MD about pain. No feraheme today and patient to see NP in [facility].  1514-Patient transported to ED via wheelchair.

## 2018-07-09 NOTE — ED Notes (Signed)
Pt has replaced money back into purse. Purse attached to pts wheelchair.
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
## 2018-07-10 ENCOUNTER — Encounter: Payer: Self-pay | Admitting: Oncology
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
## 2018-07-16 ENCOUNTER — Inpatient Hospital Stay: Payer: Medicaid Other

## 2018-07-23 ENCOUNTER — Other Ambulatory Visit: Payer: Self-pay

## 2018-07-23 DIAGNOSIS — R194 Change in bowel habit: Secondary | ICD-10-CM

## 2018-07-23 DIAGNOSIS — R109 Unspecified abdominal pain: Secondary | ICD-10-CM

## 2018-07-30 ENCOUNTER — Encounter: Payer: Self-pay | Admitting: Anesthesiology

## 2018-07-30 ENCOUNTER — Other Ambulatory Visit: Payer: Self-pay

## 2018-07-30 ENCOUNTER — Encounter: Payer: Self-pay | Admitting: *Deleted

## 2018-07-31 ENCOUNTER — Other Ambulatory Visit: Payer: Medicaid Other

## 2018-08-10 ENCOUNTER — Other Ambulatory Visit: Admission: RE | Admit: 2018-08-10 | Payer: Medicaid Other | Source: Ambulatory Visit

## 2018-08-11 ENCOUNTER — Ambulatory Visit: Payer: Medicaid Other | Admitting: Gastroenterology

## 2018-08-12 ENCOUNTER — Telehealth: Payer: Self-pay

## 2018-08-12 NOTE — Telephone Encounter (Signed)
LVM informing patient that we will need to reschedule her colonoscopy due to COVID test not being completed on 08/10/18. Asked her to call office back to reschedule colon and COVID test.  Scheduling Note:  EGD 43235, Colonoscopy [phone]  DX Altered bowel habits R19.4, abdominal pain R10.9  Thanks Sharyn Lull

## 2018-08-13 ENCOUNTER — Ambulatory Visit: Admission: RE | Admit: 2018-08-13 | Payer: Medicaid Other | Source: Home / Self Care | Admitting: Gastroenterology

## 2018-08-13 SURGERY — COLONOSCOPY WITH PROPOFOL
Anesthesia: General

## 2018-09-08 ENCOUNTER — Inpatient Hospital Stay: Payer: Medicaid Other | Admitting: Oncology

## 2018-09-08 ENCOUNTER — Other Ambulatory Visit: Payer: Self-pay | Admitting: Gastroenterology

## 2018-09-08 ENCOUNTER — Inpatient Hospital Stay: Payer: Medicaid Other | Attending: Oncology

## 2018-09-08 DIAGNOSIS — D518 Other vitamin B12 deficiency anemias: Secondary | ICD-10-CM

## 2018-09-11 ENCOUNTER — Other Ambulatory Visit: Payer: Self-pay | Admitting: Physician Assistant

## 2018-10-14 ENCOUNTER — Telehealth: Payer: Self-pay | Admitting: Oncology

## 2018-10-14 NOTE — Telephone Encounter (Signed)
LM to try and R/S N/S appt 10/14/2018-ltg °

## 2018-11-25 ENCOUNTER — Encounter: Payer: Self-pay | Admitting: Emergency Medicine

## 2018-11-25 ENCOUNTER — Emergency Department: Payer: Medicaid Other

## 2018-11-25 ENCOUNTER — Other Ambulatory Visit: Payer: Self-pay

## 2018-11-25 ENCOUNTER — Inpatient Hospital Stay
Admission: EM | Admit: 2018-11-25 | Discharge: 2018-11-26 | DRG: 917 | Disposition: A | Discharge status: Home / Self Care | Payer: Medicaid Other | Attending: Specialist | Admitting: Specialist

## 2018-11-25 DIAGNOSIS — I2510 Atherosclerotic heart disease of native coronary artery without angina pectoris: Secondary | ICD-10-CM | POA: Diagnosis present

## 2018-11-25 DIAGNOSIS — T50901A Poisoning by unspecified drugs, medicaments and biological substances, accidental (unintentional), initial encounter: Secondary | ICD-10-CM | POA: Diagnosis not present

## 2018-11-25 DIAGNOSIS — K219 Gastro-esophageal reflux disease without esophagitis: Secondary | ICD-10-CM | POA: Diagnosis present

## 2018-11-25 DIAGNOSIS — Z825 Family history of asthma and other chronic lower respiratory diseases: Secondary | ICD-10-CM

## 2018-11-25 DIAGNOSIS — Z832 Family history of diseases of the blood and blood-forming organs and certain disorders involving the immune mechanism: Secondary | ICD-10-CM

## 2018-11-25 DIAGNOSIS — Z23 Encounter for immunization: Secondary | ICD-10-CM

## 2018-11-25 DIAGNOSIS — F603 Borderline personality disorder: Secondary | ICD-10-CM | POA: Diagnosis present

## 2018-11-25 DIAGNOSIS — J45909 Unspecified asthma, uncomplicated: Secondary | ICD-10-CM | POA: Diagnosis present

## 2018-11-25 DIAGNOSIS — Z91410 Personal history of adult physical and sexual abuse: Secondary | ICD-10-CM

## 2018-11-25 DIAGNOSIS — I110 Hypertensive heart disease with heart failure: Secondary | ICD-10-CM | POA: Diagnosis present

## 2018-11-25 DIAGNOSIS — G9341 Metabolic encephalopathy: Secondary | ICD-10-CM | POA: Diagnosis present

## 2018-11-25 DIAGNOSIS — M797 Fibromyalgia: Secondary | ICD-10-CM | POA: Diagnosis present

## 2018-11-25 DIAGNOSIS — I509 Heart failure, unspecified: Secondary | ICD-10-CM | POA: Diagnosis present

## 2018-11-25 DIAGNOSIS — Z818 Family history of other mental and behavioral disorders: Secondary | ICD-10-CM

## 2018-11-25 DIAGNOSIS — F319 Bipolar disorder, unspecified: Secondary | ICD-10-CM | POA: Diagnosis present

## 2018-11-25 DIAGNOSIS — D649 Anemia, unspecified: Secondary | ICD-10-CM | POA: Diagnosis present

## 2018-11-25 DIAGNOSIS — Z86711 Personal history of pulmonary embolism: Secondary | ICD-10-CM | POA: Diagnosis not present

## 2018-11-25 DIAGNOSIS — Z7982 Long term (current) use of aspirin: Secondary | ICD-10-CM

## 2018-11-25 DIAGNOSIS — Y92009 Unspecified place in unspecified non-institutional (private) residence as the place of occurrence of the external cause: Secondary | ICD-10-CM | POA: Diagnosis not present

## 2018-11-25 DIAGNOSIS — T50904A Poisoning by unspecified drugs, medicaments and biological substances, undetermined, initial encounter: Secondary | ICD-10-CM

## 2018-11-25 DIAGNOSIS — F1320 Sedative, hypnotic or anxiolytic dependence, uncomplicated: Secondary | ICD-10-CM | POA: Diagnosis present

## 2018-11-25 DIAGNOSIS — E559 Vitamin D deficiency, unspecified: Secondary | ICD-10-CM | POA: Diagnosis present

## 2018-11-25 DIAGNOSIS — G894 Chronic pain syndrome: Secondary | ICD-10-CM | POA: Diagnosis present

## 2018-11-25 DIAGNOSIS — I959 Hypotension, unspecified: Secondary | ICD-10-CM | POA: Diagnosis present

## 2018-11-25 DIAGNOSIS — F1120 Opioid dependence, uncomplicated: Secondary | ICD-10-CM | POA: Diagnosis present

## 2018-11-25 DIAGNOSIS — Z79899 Other long term (current) drug therapy: Secondary | ICD-10-CM

## 2018-11-25 DIAGNOSIS — Z20828 Contact with and (suspected) exposure to other viral communicable diseases: Secondary | ICD-10-CM | POA: Diagnosis present

## 2018-11-25 DIAGNOSIS — D689 Coagulation defect, unspecified: Secondary | ICD-10-CM | POA: Diagnosis present

## 2018-11-25 DIAGNOSIS — Z9884 Bariatric surgery status: Secondary | ICD-10-CM

## 2018-11-25 DIAGNOSIS — Z915 Personal history of self-harm: Secondary | ICD-10-CM

## 2018-11-25 DIAGNOSIS — Z8673 Personal history of transient ischemic attack (TIA), and cerebral infarction without residual deficits: Secondary | ICD-10-CM

## 2018-11-25 DIAGNOSIS — T403X4A Poisoning by methadone, undetermined, initial encounter: Secondary | ICD-10-CM | POA: Diagnosis present

## 2018-11-25 DIAGNOSIS — F419 Anxiety disorder, unspecified: Secondary | ICD-10-CM | POA: Diagnosis present

## 2018-11-25 DIAGNOSIS — Z8249 Family history of ischemic heart disease and other diseases of the circulatory system: Secondary | ICD-10-CM

## 2018-11-25 DIAGNOSIS — R4182 Altered mental status, unspecified: Secondary | ICD-10-CM

## 2018-11-25 DIAGNOSIS — Z86718 Personal history of other venous thrombosis and embolism: Secondary | ICD-10-CM

## 2018-11-25 LAB — COMPREHENSIVE METABOLIC PANEL
ALT: 20 U/L (ref 0–44)
AST: 24 U/L (ref 15–41)
Albumin: 3.7 g/dL (ref 3.5–5.0)
Alkaline Phosphatase: 92 U/L (ref 38–126)
Anion gap: 13 (ref 5–15)
BUN: 15 mg/dL (ref 6–20)
CO2: 24 mmol/L (ref 22–32)
Calcium: 8.6 mg/dL — ABNORMAL LOW (ref 8.9–10.3)
Chloride: 105 mmol/L (ref 98–111)
Creatinine, Ser: 1.02 mg/dL — ABNORMAL HIGH (ref 0.44–1.00)
GFR calc Af Amer: >60 mL/min (ref >60)
GFR calc non Af Amer: >60 mL/min (ref >60)
Glucose, Bld: 109 mg/dL — ABNORMAL HIGH (ref 70–99)
Potassium: 3.6 mmol/L (ref 3.5–5.1)
Sodium: 142 mmol/L (ref 135–145)
Total Bilirubin: 0.6 mg/dL (ref 0.3–1.2)
Total Protein: 6.9 g/dL (ref 6.5–8.1)

## 2018-11-25 LAB — CBC WITH DIFFERENTIAL/PLATELET
Abs Immature Granulocytes: 0.01 10*3/uL (ref 0.00–0.07)
Basophils Absolute: 0.1 10*3/uL (ref 0.0–0.1)
Basophils Relative: 1 %
Eosinophils Absolute: 0.1 10*3/uL (ref 0.0–0.5)
Eosinophils Relative: 2 %
HCT: 35.9 % — ABNORMAL LOW (ref 36.0–46.0)
Hemoglobin: 11.2 g/dL — ABNORMAL LOW (ref 12.0–15.0)
Immature Granulocytes: 0 %
Lymphocytes Relative: 36 %
Lymphs Abs: 1.9 10*3/uL (ref 0.7–4.0)
MCH: 28.4 pg (ref 26.0–34.0)
MCHC: 31.2 g/dL (ref 30.0–36.0)
MCV: 90.9 fL (ref 80.0–100.0)
Monocytes Absolute: 0.5 10*3/uL (ref 0.1–1.0)
Monocytes Relative: 10 %
Neutro Abs: 2.6 10*3/uL (ref 1.7–7.7)
Neutrophils Relative %: 51 %
Platelets: 166 10*3/uL (ref 150–400)
RBC: 3.95 MIL/uL (ref 3.87–5.11)
RDW: 14.7 % (ref 11.5–15.5)
WBC: 5.2 10*3/uL (ref 4.0–10.5)
nRBC: 0 % (ref 0.0–0.2)

## 2018-11-25 LAB — GLUCOSE, CAPILLARY: Glucose-Capillary: 84 mg/dL (ref 70–99)

## 2018-11-25 LAB — AMMONIA: Ammonia: 10 umol/L (ref 9–35)

## 2018-11-25 LAB — ETHANOL: Alcohol, Ethyl (B): <10 mg/dL (ref <10)

## 2018-11-25 LAB — SALICYLATE LEVEL: Salicylate Lvl: <7 mg/dL (ref 2.8–30.0)

## 2018-11-25 LAB — ACETAMINOPHEN LEVEL: Acetaminophen (Tylenol), Serum: <10 ug/mL — ABNORMAL LOW (ref 10–30)

## 2018-11-25 LAB — MRSA PCR SCREENING: MRSA by PCR: NEGATIVE

## 2018-11-25 LAB — SARS CORONAVIRUS 2 BY RT PCR (HOSPITAL ORDER, PERFORMED IN ~~LOC~~ HOSPITAL LAB): SARS Coronavirus 2: NEGATIVE

## 2018-11-25 LAB — TROPONIN I (HIGH SENSITIVITY): Troponin I (High Sensitivity): 6 ng/L (ref <18)

## 2018-11-25 MED ORDER — ALBUTEROL SULFATE (2.5 MG/3ML) 0.083% IN NEBU: 2.5000 mg | INHALATION_SOLUTION | RESPIRATORY_TRACT | Status: DC | PRN

## 2018-11-25 MED ORDER — NALOXONE HCL 2 MG/2ML IJ SOSY
1.0000 mg | PREFILLED_SYRINGE | Freq: Once | INTRAMUSCULAR | Status: AC
  Administered 2018-11-25: 1 mg via INTRAVENOUS
  Filled 2018-11-25: qty 2

## 2018-11-25 MED ORDER — ENOXAPARIN SODIUM 80 MG/0.8ML ~~LOC~~ SOLN
70.0000 mg | Freq: Two times a day (BID) | SUBCUTANEOUS | Status: DC
  Administered 2018-11-25 – 2018-11-26 (×2): 70 mg via SUBCUTANEOUS
  Filled 2018-11-25 (×3): qty 0.8

## 2018-11-25 MED ORDER — SENNOSIDES-DOCUSATE SODIUM 8.6-50 MG PO TABS
1.0000 | ORAL_TABLET | Freq: Every evening | ORAL | Status: DC | PRN
  Administered 2018-11-25: 23:00:00 1 via ORAL
  Filled 2018-11-25: qty 1

## 2018-11-25 MED ORDER — PNEUMOCOCCAL VAC POLYVALENT 25 MCG/0.5ML IJ INJ: 0.5000 mL | INJECTION | INTRAMUSCULAR | Status: DC

## 2018-11-25 MED ORDER — ASPIRIN 81 MG PO CHEW
81.0000 mg | CHEWABLE_TABLET | Freq: Every day | ORAL | Status: DC
  Administered 2018-11-25 – 2018-11-26 (×2): 81 mg via ORAL
  Filled 2018-11-25 (×2): qty 1

## 2018-11-25 MED ORDER — FLUTICASONE PROPIONATE 50 MCG/ACT NA SUSP
2.0000 | Freq: Every day | NASAL | Status: DC
  Filled 2018-11-25: qty 16

## 2018-11-25 MED ORDER — TRAZODONE HCL 50 MG PO TABS
300.0000 mg | ORAL_TABLET | Freq: Every day | ORAL | Status: DC
  Administered 2018-11-25: 23:00:00 300 mg via ORAL
  Filled 2018-11-25: qty 6
  Filled 2018-11-25: qty 3

## 2018-11-25 MED ORDER — PANTOPRAZOLE SODIUM 40 MG PO TBEC
40.0000 mg | DELAYED_RELEASE_TABLET | Freq: Every day | ORAL | Status: DC
  Administered 2018-11-25 – 2018-11-26 (×2): 40 mg via ORAL
  Filled 2018-11-25 (×2): qty 1

## 2018-11-25 MED ORDER — NALOXONE HCL 4 MG/10ML IJ SOLN
0.5000 mg/h | INTRAVENOUS | Status: DC
  Administered 2018-11-25: 0.5 mg/h via INTRAVENOUS
  Filled 2018-11-25: qty 10

## 2018-11-25 MED ORDER — BISACODYL 5 MG PO TBEC: 5.0000 mg | DELAYED_RELEASE_TABLET | Freq: Every day | ORAL | Status: DC | PRN

## 2018-11-25 MED ORDER — CHLORHEXIDINE GLUCONATE CLOTH 2 % EX PADS
6.0000 | MEDICATED_PAD | Freq: Every day | CUTANEOUS | Status: DC
  Administered 2018-11-25: 6 via TOPICAL

## 2018-11-25 MED ORDER — ZOLPIDEM TARTRATE 5 MG PO TABS
5.0000 mg | ORAL_TABLET | Freq: Every day | ORAL | Status: DC
  Administered 2018-11-25: 23:00:00 5 mg via ORAL
  Filled 2018-11-25 (×2): qty 1

## 2018-11-25 MED ORDER — SODIUM CHLORIDE 0.9 % IV BOLUS
1000.0000 mL | Freq: Once | INTRAVENOUS | Status: AC
  Administered 2018-11-25: 1000 mL via INTRAVENOUS

## 2018-11-25 MED ORDER — ACETAMINOPHEN 650 MG RE SUPP: 650.0000 mg | Freq: Four times a day (QID) | RECTAL | Status: DC | PRN

## 2018-11-25 MED ORDER — PREGABALIN 75 MG PO CAPS
200.0000 mg | ORAL_CAPSULE | Freq: Two times a day (BID) | ORAL | Status: DC
  Administered 2018-11-25 – 2018-11-26 (×2): 200 mg via ORAL
  Filled 2018-11-25: qty 1
  Filled 2018-11-25: qty 2

## 2018-11-25 MED ORDER — ACETAMINOPHEN 325 MG PO TABS: 650.0000 mg | ORAL_TABLET | Freq: Four times a day (QID) | ORAL | Status: DC | PRN

## 2018-11-25 MED ORDER — OXYCODONE HCL 5 MG PO TABS
10.0000 mg | ORAL_TABLET | Freq: Four times a day (QID) | ORAL | Status: DC | PRN
  Administered 2018-11-25 – 2018-11-26 (×4): 10 mg via ORAL
  Filled 2018-11-25 (×4): qty 2

## 2018-11-25 MED ORDER — TOPIRAMATE 100 MG PO TABS
100.0000 mg | ORAL_TABLET | Freq: Every day | ORAL | Status: DC
  Administered 2018-11-25 – 2018-11-26 (×2): 100 mg via ORAL
  Filled 2018-11-25 (×2): qty 1

## 2018-11-25 MED ORDER — ONDANSETRON HCL 4 MG/2ML IJ SOLN
4.0000 mg | Freq: Four times a day (QID) | INTRAMUSCULAR | Status: DC | PRN
  Administered 2018-11-25 – 2018-11-26 (×2): 4 mg via INTRAVENOUS
  Filled 2018-11-25 (×2): qty 2

## 2018-11-25 MED ORDER — SODIUM CHLORIDE 0.9 % IV BOLUS: 1000.0000 mL | Freq: Once | INTRAVENOUS | Status: DC

## 2018-11-25 MED ORDER — ONDANSETRON HCL 4 MG PO TABS: 4.0000 mg | ORAL_TABLET | Freq: Four times a day (QID) | ORAL | Status: DC | PRN

## 2018-11-25 MED ORDER — SODIUM CHLORIDE 0.9 % IV SOLN: INTRAVENOUS | Status: DC

## 2018-11-25 NOTE — ED Provider Notes (Signed)
.  Critical Care Performed by: Carrie Mew, MD Authorized by: Carrie Mew, MD   Critical care provider statement:    Critical care time (minutes):  35   Critical care time was exclusive of:  Separately billable procedures and treating other patients   Critical care was necessary to treat or prevent imminent or life-threatening deterioration of the following conditions:  CNS failure or compromise, toxidrome and shock   Critical care was time spent personally by me on the following activities:  Development of treatment plan with patient or surrogate, discussions with consultants, evaluation of patient's response to treatment, examination of patient, obtaining history from patient or surrogate, ordering and performing treatments and interventions, ordering and review of laboratory studies, ordering and review of radiographic studies, pulse oximetry, re-evaluation of patient's condition and review of old charts Comments:        Angiocath insertion  Date/Time: 11/25/2018 2:19 PM Performed by: Carrie Mew, MD Authorized by: Carrie Mew, MD  Consent: The procedure was performed in an emergent situation. Preparation: Patient was prepped and draped in the usual sterile fashion. Patient tolerance: patient tolerated the procedure well with no immediate complications Comments: 2 attempts.  20-gauge IV placed in left brachial vein.     Clinical Course as of Nov 24 820  Wed Nov 25, 2018  0659 Care transferred to Dr. Joni Fears at change of shift.  Pending all laboratory, urinalysis and imaging studies as well as psychiatric evaluation.  Sitter remains at bedside for safety.   [JS]    Clinical Course User Index [JS] Paulette Blanch, MD    ----------------------------------------- 8:22 AM on 11/25/2018 ----------------------------------------- IV fluid infusion rate increased due to low blood pressure.  Patient somnolent but still easily arousable, mental status is reassuring,  and I think that hypotension is due to dehydration.    ----------------------------------------- 12:04 PM on 11/25/2018 -----------------------------------------  New peripheral IV placed by me under ultrasound visualization.  Patient is arousable but still not back to normal mental status.  Still hypotensive  ----------------------------------------- 2:18 PM on 11/25/2018 -----------------------------------------  Due to refractory hypotension despite multiple fluid boluses, Narcan bolus given.  This resulted in immediate normalization of blood pressure as well as mental status arousal.  Diagnostic opioid overdose likely from her methadone given how long it has persisted.  I will start her on a Narcan drip and admit.  Also received a phone call from the patient's son who is worried that she has been voicing a wish to die lately and he is concerned that this represents a suicide attempt.  I have initiated involuntary commitment as well pending psychiatry evaluation.  Final diagnoses:  Altered mental status, unspecified altered mental status type  Drug overdose, undetermined intent, initial encounter       Carrie Mew, MD 11/25/18 1420

## 2018-11-25 NOTE — ED Notes (Signed)
Report received, care of pt assumed.  Dr. Joni Fears at bedside to attempt new IV placement.  Safety sitter remains at bedside, will monitor.

## 2018-11-25 NOTE — ED Triage Notes (Signed)
Patient arrives from home following possible OD on methadone. According to son, patient had methadone prescription filled yesterday and frequently ODs after prescription is filled. Found unresponsive by son on kitchen floor

## 2018-11-25 NOTE — ED Notes (Signed)
Narcan administered as ordered, pt more awake at this time, asks this RN "what is that you just gave me."  When pt was informed she was administered Narcan, pt states "Why do you keep giving me that, it makes me sick."  Pt informed that he BP continues to drop too low and the Narcan helps to reverse the narcotic that is causing her BP to be too low.  Will continue to monitor closely.

## 2018-11-25 NOTE — BH Assessment (Signed)
Pt asleep; unable to arouse. Will attempt to assess once patient is alert and able to participate.

## 2018-11-25 NOTE — ED Notes (Signed)
Patient denies that OD was intentional however says she took more than is prescribed

## 2018-11-25 NOTE — ED Notes (Signed)
Lab at bedside to draw blood work
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
## 2018-11-25 NOTE — ED Notes (Signed)
IVC PAPERS  RESCINDED PER  DR  CRISTOFONO MD Donavan Burnet  RN  CHARGE NURSE  AND JENNIFER  RN

## 2018-11-25 NOTE — ED Notes (Signed)
Attempted to stick multiple times without success
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
## 2018-11-25 NOTE — ED Notes (Signed)
Son called to check on pt.  Son states that there are several witnesses to the patient making statements that she wants to die.  He believes that pt should be IVC'd and have an evaluation.  Discussed with son that pt is not alert enough to discuss SI at this time and that this RN will discuss with MD.

## 2018-11-25 NOTE — ED Notes (Signed)
MD made aware of patient's BP. Patient sleeping but easily arousable. Order for second fluid bolus given.

## 2018-11-25 NOTE — ED Notes (Signed)
This RN noted a small skin opening around umbilicus, informed MD, gauze placed overtop
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
## 2018-11-25 NOTE — ED Notes (Signed)
PT  PLACED UNDER  IVC  PAPERS  PER  DR  Joni Fears  MD  INFORMED  RN Anderson [place]
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
## 2018-11-25 NOTE — ED Notes (Signed)
Lab called to come draw blood.

## 2018-11-25 NOTE — Progress Notes (Signed)
Anticoagulation monitoring(Lovenox):  48 yo female ordered Lovenox 80 mg Q12h for history of DVT   Filed Weights   11/25/18 0547 11/25/18 1702  Weight: 154 lb 15.7 oz (70.3 kg) 158 lb 11.7 oz (72 kg)   BMI 25.6   Lab Results  Component Value Date   CREATININE 1.02 (H) 11/25/2018   CREATININE 0.77 07/09/2018   CREATININE 1.00 06/24/2018   Estimated Creatinine Clearance: 70.8 mL/min (A) (by C-G formula based on SCr of 1.02 mg/dL (H)). Hemoglobin & Hematocrit     Component Value Date/Time   HGB 11.2 (L) 11/25/2018 0639   HCT 35.9 (L) 11/25/2018 [id number]     Per Protocol for Patient with estCrcl > 30 ml/min and BMI < 40 and TBW = 72 kg, will transition to Lovenox 70 mg Q12h.

## 2018-11-26 LAB — BASIC METABOLIC PANEL
Anion gap: 6 (ref 5–15)
BUN: 14 mg/dL (ref 6–20)
CO2: 24 mmol/L (ref 22–32)
Calcium: 8.5 mg/dL — ABNORMAL LOW (ref 8.9–10.3)
Chloride: 112 mmol/L — ABNORMAL HIGH (ref 98–111)
Creatinine, Ser: 0.88 mg/dL (ref 0.44–1.00)
GFR calc Af Amer: >60 mL/min (ref >60)
GFR calc non Af Amer: >60 mL/min (ref >60)
Glucose, Bld: 90 mg/dL (ref 70–99)
Potassium: 3.9 mmol/L (ref 3.5–5.1)
Sodium: 142 mmol/L (ref 135–145)

## 2018-11-26 LAB — CBC
HCT: 32.4 % — ABNORMAL LOW (ref 36.0–46.0)
Hemoglobin: 10.2 g/dL — ABNORMAL LOW (ref 12.0–15.0)
MCH: 28.2 pg (ref 26.0–34.0)
MCHC: 31.5 g/dL (ref 30.0–36.0)
MCV: 89.5 fL (ref 80.0–100.0)
Platelets: 146 10*3/uL — ABNORMAL LOW (ref 150–400)
RBC: 3.62 MIL/uL — ABNORMAL LOW (ref 3.87–5.11)
RDW: 15 % (ref 11.5–15.5)
WBC: 3.4 10*3/uL — ABNORMAL LOW (ref 4.0–10.5)
nRBC: 0 % (ref 0.0–0.2)

## 2018-11-26 LAB — URINE DRUG SCREEN, QUALITATIVE (ARMC ONLY)
Amphetamines, Ur Screen: NOT DETECTED
Barbiturates, Ur Screen: NOT DETECTED
Benzodiazepine, Ur Scrn: NOT DETECTED
Cannabinoid 50 Ng, Ur ~~LOC~~: NOT DETECTED
Cocaine Metabolite,Ur ~~LOC~~: NOT DETECTED
MDMA (Ecstasy)Ur Screen: NOT DETECTED
Methadone Scn, Ur: POSITIVE — AB
Opiate, Ur Screen: NOT DETECTED
Phencyclidine (PCP) Ur S: NOT DETECTED
Tricyclic, Ur Screen: NOT DETECTED

## 2018-11-26 LAB — URINALYSIS, COMPLETE (UACMP) WITH MICROSCOPIC
Bilirubin Urine: NEGATIVE
Glucose, UA: NEGATIVE mg/dL
Hgb urine dipstick: NEGATIVE
Ketones, ur: NEGATIVE mg/dL
Leukocytes,Ua: NEGATIVE
Nitrite: NEGATIVE
Protein, ur: NEGATIVE mg/dL
Specific Gravity, Urine: 1.004 — ABNORMAL LOW (ref 1.005–1.030)
pH: 6 (ref 5.0–8.0)

## 2018-11-26 MED ORDER — INFLUENZA VAC SPLIT QUAD 0.5 ML IM SUSY
0.5000 mL | PREFILLED_SYRINGE | INTRAMUSCULAR | Status: AC
  Administered 2018-11-26: 0.5 mL via INTRAMUSCULAR

## 2018-11-26 MED ORDER — FLUTICASONE PROPIONATE 50 MCG/ACT NA SUSP: 2.0000 | Freq: Every day | NASAL | 2 refills | Status: AC

## 2018-11-26 MED ORDER — GABAPENTIN 300 MG PO CAPS
800.0000 mg | ORAL_CAPSULE | Freq: Three times a day (TID) | ORAL | Status: DC
  Administered 2018-11-26: 800 mg via ORAL
  Filled 2018-11-26: qty 2

## 2018-11-26 MED ORDER — ASPIRIN 81 MG PO CHEW: 81.0000 mg | CHEWABLE_TABLET | Freq: Every day | ORAL | Status: AC

## 2018-11-26 MED ORDER — ESOMEPRAZOLE MAGNESIUM 40 MG PO CPDR: 40.0000 mg | DELAYED_RELEASE_CAPSULE | Freq: Two times a day (BID) | ORAL | Status: AC

## 2018-11-26 MED ORDER — METHADONE HCL 10 MG PO TABS
70.0000 mg | ORAL_TABLET | Freq: Every day | ORAL | Status: DC
  Administered 2018-11-26: 12:00:00 70 mg via ORAL
  Filled 2018-11-26: qty 7

## 2018-11-26 NOTE — Plan of Care (Signed)
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
## 2018-11-26 NOTE — Progress Notes (Signed)
Patient discharged home and transported POV by significant other. Patient refused to wear socks and patient had no shoes at bedside. Patient educated about the risks of walking outdoors barefoot. Patient verbalized understanding of education.

## 2018-11-26 NOTE — TOC Initial Note (Signed)
Transition of Care [facility]) - Initial/Assessment Note    Patient Details  Name: Leslie Marks MRN: [id number] Date of Birth: 1970/12/12  Transition of [facility]) CM/SW Contact:    Candie Chroman, LCSW Phone Number: 11/26/2018, 12:10 PM  Clinical Narrative: Readmission prevention screen complete. CSW met with patient. No supports at bedside. CSW introduced role and explained that discharge planning would be discussed. Patient's PCP is Philis Fendt, PA at [facility] in [place]. Pharmacy is [facility]. No issues affording medications. Boyfriend provides transportation. Patient did not have home health prior to admission but she is requesting a home health nurse for wound care. [facility] will review. Patient is requesting a cane at discharge. [facility] is aware. Patient's boyfriend will pick her up today.                 Expected Discharge Plan: [place] Barriers to Discharge: Barriers Resolved   Patient Goals and CMS Choice        Expected Discharge Plan and Services Expected Discharge Plan: [place] Choice: Home Health, Durable Medical Equipment Living arrangements for the past 2 months: Single Family Home Expected Discharge Date: 11/26/18               DME Arranged: Kasandra Knudsen DME Agency: [facility] Date DME Agency Contacted: 11/26/18   Representative spoke with at DME Agency: [place] Arranged: RN          Prior Living Arrangements/Services Living arrangements for the past 2 months: Single Family Home Lives with:: Adult Children, Significant Other Patient language and need for interpreter reviewed:: Yes Do you feel safe going back to the place where you live?: Yes      Need for Family Participation in Patient Care: Yes (Comment) Care giver support system in place?: Yes (comment)   Criminal Activity/Legal Involvement Pertinent to Current Situation/Hospitalization: No - Comment as  needed  Activities of Daily Living Home Assistive Devices/Equipment: None ADL Screening (condition at time of admission) Patient's cognitive ability adequate to safely complete daily activities?: Yes Is the patient deaf or have difficulty hearing?: No Does the patient have difficulty seeing, even when wearing glasses/contacts?: No Does the patient have difficulty concentrating, remembering, or making decisions?: Yes Patient able to express need for assistance with ADLs?: Yes Does the patient have difficulty dressing or bathing?: Yes Independently performs ADLs?: Yes (appropriate for developmental age) Does the patient have difficulty walking or climbing stairs?: Yes Weakness of Legs: Both Weakness of Arms/Hands: Both  Permission Sought/Granted Permission sought to share information with : Facility Art therapist granted to share information with : Yes, Verbal Permission Granted     Permission granted to share info w AGENCY: Home health, DME agencies        Emotional Assessment Appearance:: Appears stated age Attitude/Demeanor/Rapport: Engaged, Gracious Affect (typically observed): Accepting, Appropriate, Calm, Pleasant Orientation: : Oriented to Self, Oriented to Place, Oriented to  Time, Oriented to Situation Alcohol / Substance Use: Never Used Psych Involvement: Outpatient Provider(ACT team)  Admission diagnosis:  Methadone overdose of undetermined intent, initial encounter ([place]) [T40.3X4A] Drug overdose, undetermined intent, initial encounter [T50.904A] Altered mental status, unspecified altered mental status type [R41.82] Patient Active Problem List   Diagnosis Date Noted  . Drug overdose 11/25/2018  . Hyperglycemia 06/25/2018  . 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 History of pulmonary embolism 06/17/2018  . 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  History of drug overdose 03/12/2016  . History of migraine headaches 10/06/2015   . Methadone use ([place]) 04/20/2015  . History of CVA (cerebrovascular accident) 12/21/2014  . Abnormal LFTs (liver function tests) 11/20/2014  . Mild intermittent asthma 11/20/2014  . 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 Atherosclerotic heart disease of native coronary artery without angina pectoris 11/20/2013  . 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 Malabsorption syndrome 05/27/2013  . Lupus anticoagulant positive 04/29/2013  . Chronic pain syndrome 04/27/2013  . Chronic anxiety 04/27/2013  . Depression 04/26/2013  . Chronic anticoagulation 04/26/2013  . Pancytopenia ([place]) 04/26/2013  . Personal history of other venous thrombosis and embolism 03/17/2013  . Bipolar I disorder ([place]) 12/25/2011  . B12 deficiency 11/16/2010  . Iron deficiency anemia 09/25/2010  . S/P insertion of IVC (inferior vena caval) filter 09/25/2010  . Borderline personality disorder ([place]) 09/24/2010  . Low back pain 09/24/2010   PCP:  Tereasa Coop, PA-C Pharmacy:   [facility], [place] [address] [place] [address] Phone: [phone] Fax: [phone]     Social Determinants of Health (SDOH) Interventions    Readmission Risk Interventions Readmission Risk Prevention Plan 11/26/2018  Transportation Screening Complete  PCP or Specialist Appt within 3-5 Days Complete  HRI or [place] Complete  Social Work Consult for [place] Planning/Counseling Complete  Palliative Care Screening Not Applicable  Medication Review Press photographer) Complete  Some recent data might be hidden

## 2018-11-26 NOTE — TOC Transition Note (Signed)
Transition of Care [facility]) - CM/SW Discharge Note   Patient Details  Name: Leslie Marks MRN: [id number] Date of Birth: 02-Feb-1971  Transition of Care [facility]) CM/SW Contact:  Candie Chroman, LCSW Phone Number: 11/26/2018, 1:25 PM   Clinical Narrative:  Patient's cane has been delivered. Unable to find a home health agency to provide nursing care. CSW encouraged patient to ask her PCP about a wound care referral at her appt next Thursday. No further concerns. CSW signing off.   Final next level of care: Home/Self Care Barriers to Discharge: Barriers Resolved   Patient Goals and CMS Choice        Discharge Placement                Patient to be transferred to facility by: Boyfriend will pick her up.   Patient and family notified of of transfer: 11/26/18  Discharge Plan and Services     Post Acute Care Choice: Home Health, Durable Medical Equipment          DME Arranged: Kasandra Knudsen DME Agency: [employer] Date DME Agency Contacted: 11/26/18   Representative spoke with at DME Agency: [place] Arranged: NA          Social Determinants of Health ([place]) Interventions     Readmission Risk Interventions Readmission Risk Prevention Plan 11/26/2018  Transportation Screening Complete  PCP or Specialist Appt within 3-5 Days Complete  HRI or [place] Complete  Social Work Consult for [place] Planning/Counseling Complete  Palliative Care Screening Not Applicable  Medication Review [employer]) Complete  Some recent data might be hidden

## 2018-12-02 ENCOUNTER — Inpatient Hospital Stay: Payer: Medicaid Other | Admitting: Family Medicine

## 2018-12-28 ENCOUNTER — Telehealth: Payer: Self-pay | Admitting: Gastroenterology

## 2018-12-28 NOTE — Telephone Encounter (Signed)
Pt left vm to see if a nurse could triage her

## 2019-01-05 ENCOUNTER — Other Ambulatory Visit: Payer: Self-pay

## 2019-01-05 ENCOUNTER — Encounter: Payer: Self-pay | Admitting: Gastroenterology

## 2019-01-05 ENCOUNTER — Ambulatory Visit: Payer: Medicaid Other | Admitting: Gastroenterology

## 2019-01-05 VITALS — BP 121/81 | HR 97 | Temp 98.1°F | Resp 17 | Wt 155.8 lb

## 2019-01-05 DIAGNOSIS — Z9884 Bariatric surgery status: Secondary | ICD-10-CM

## 2019-01-05 DIAGNOSIS — D509 Iron deficiency anemia, unspecified: Secondary | ICD-10-CM

## 2019-01-05 DIAGNOSIS — K529 Noninfective gastroenteritis and colitis, unspecified: Secondary | ICD-10-CM

## 2019-01-05 DIAGNOSIS — R634 Abnormal weight loss: Secondary | ICD-10-CM

## 2019-01-05 DIAGNOSIS — D508 Other iron deficiency anemias: Secondary | ICD-10-CM | POA: Diagnosis not present

## 2019-01-05 DIAGNOSIS — D518 Other vitamin B12 deficiency anemias: Secondary | ICD-10-CM | POA: Diagnosis not present

## 2019-01-05 MED ORDER — METOCLOPRAMIDE HCL 5 MG PO TABS: 5.0000 mg | ORAL_TABLET | Freq: Three times a day (TID) | ORAL | 0 refills | Status: DC | PRN

## 2019-01-05 MED ORDER — DICYCLOMINE HCL 10 MG PO CAPS: 10.0000 mg | ORAL_CAPSULE | Freq: Three times a day (TID) | ORAL | 0 refills | Status: DC

## 2019-01-05 MED ORDER — RIFAXIMIN 550 MG PO TABS: 550.0000 mg | ORAL_TABLET | Freq: Two times a day (BID) | ORAL | 0 refills | Status: AC
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
## 2019-01-06 ENCOUNTER — Other Ambulatory Visit: Payer: Self-pay

## 2019-01-06 ENCOUNTER — Telehealth: Payer: Self-pay | Admitting: *Deleted

## 2019-01-06 ENCOUNTER — Encounter: Payer: Self-pay | Admitting: *Deleted

## 2019-01-06 DIAGNOSIS — D509 Iron deficiency anemia, unspecified: Secondary | ICD-10-CM

## 2019-01-06 NOTE — Telephone Encounter (Signed)
Dr. Janese Banks had asked me to call the patient to reschedule her for labs a few days before and then see the doctor and get an infusion of Feraheme.  I called the patient she was agreeable to the plan have made her an appointment for November 20 at 1130 to be a lab only and then come on 11/24 to see Dr. Janese Banks as well as get her infusion of Feraheme.  Called patient back and she is agreeable to these dates and if she has any issues she will call me back.

## 2019-01-07 ENCOUNTER — Telehealth: Payer: Self-pay

## 2019-01-07 ENCOUNTER — Other Ambulatory Visit: Payer: Self-pay | Admitting: Gastroenterology

## 2019-01-07 NOTE — Telephone Encounter (Signed)
Patient has been informed that she is not a candidate to have her colonoscopy at [facility].  Her colonoscopy has been reschedule to December 7th at [facility].  New instructions will be sent to her to reflect date change.  Pt has been advised of new COVID test date Thursday December 3rd.  Thanks [employer]

## 2019-01-07 NOTE — Telephone Encounter (Signed)
Patient called & states she does not have a PCP & Dr Marius Ditch agreed to prescribe these medications.1)Tinzidine 4 mg takes 4x's a day 2) Nexium 40 mg 2x's a day. Please call them into [facility] in [place]

## 2019-01-08 ENCOUNTER — Other Ambulatory Visit: Payer: Self-pay

## 2019-01-08 ENCOUNTER — Other Ambulatory Visit: Admission: RE | Admit: 2019-01-08 | Payer: Medicaid Other | Source: Ambulatory Visit

## 2019-01-08 ENCOUNTER — Inpatient Hospital Stay: Payer: Medicaid Other | Attending: Oncology

## 2019-01-08 ENCOUNTER — Emergency Department: Payer: Medicaid Other

## 2019-01-08 ENCOUNTER — Observation Stay
Admission: EM | Admit: 2019-01-08 | Discharge: 2019-01-09 | Disposition: A | Discharge status: Home / Self Care | Payer: Medicaid Other | Attending: Internal Medicine | Admitting: Internal Medicine

## 2019-01-08 DIAGNOSIS — F319 Bipolar disorder, unspecified: Secondary | ICD-10-CM | POA: Diagnosis not present

## 2019-01-08 DIAGNOSIS — K219 Gastro-esophageal reflux disease without esophagitis: Secondary | ICD-10-CM | POA: Insufficient documentation

## 2019-01-08 DIAGNOSIS — Z880 Allergy status to penicillin: Secondary | ICD-10-CM | POA: Insufficient documentation

## 2019-01-08 DIAGNOSIS — J45909 Unspecified asthma, uncomplicated: Secondary | ICD-10-CM | POA: Insufficient documentation

## 2019-01-08 DIAGNOSIS — T403X4A Poisoning by methadone, undetermined, initial encounter: Secondary | ICD-10-CM | POA: Diagnosis not present

## 2019-01-08 DIAGNOSIS — I2510 Atherosclerotic heart disease of native coronary artery without angina pectoris: Secondary | ICD-10-CM | POA: Insufficient documentation

## 2019-01-08 DIAGNOSIS — F1120 Opioid dependence, uncomplicated: Secondary | ICD-10-CM | POA: Diagnosis not present

## 2019-01-08 DIAGNOSIS — Z79899 Other long term (current) drug therapy: Secondary | ICD-10-CM | POA: Insufficient documentation

## 2019-01-08 DIAGNOSIS — E538 Deficiency of other specified B group vitamins: Secondary | ICD-10-CM | POA: Insufficient documentation

## 2019-01-08 DIAGNOSIS — Z8719 Personal history of other diseases of the digestive system: Secondary | ICD-10-CM | POA: Diagnosis not present

## 2019-01-08 DIAGNOSIS — I509 Heart failure, unspecified: Secondary | ICD-10-CM | POA: Diagnosis not present

## 2019-01-08 DIAGNOSIS — K760 Fatty (change of) liver, not elsewhere classified: Secondary | ICD-10-CM | POA: Insufficient documentation

## 2019-01-08 DIAGNOSIS — F1320 Sedative, hypnotic or anxiolytic dependence, uncomplicated: Secondary | ICD-10-CM | POA: Insufficient documentation

## 2019-01-08 DIAGNOSIS — F603 Borderline personality disorder: Secondary | ICD-10-CM | POA: Insufficient documentation

## 2019-01-08 DIAGNOSIS — Z9884 Bariatric surgery status: Secondary | ICD-10-CM | POA: Insufficient documentation

## 2019-01-08 DIAGNOSIS — F419 Anxiety disorder, unspecified: Secondary | ICD-10-CM | POA: Insufficient documentation

## 2019-01-08 DIAGNOSIS — G8929 Other chronic pain: Secondary | ICD-10-CM | POA: Diagnosis present

## 2019-01-08 DIAGNOSIS — Z7982 Long term (current) use of aspirin: Secondary | ICD-10-CM | POA: Diagnosis not present

## 2019-01-08 DIAGNOSIS — Z20828 Contact with and (suspected) exposure to other viral communicable diseases: Secondary | ICD-10-CM | POA: Insufficient documentation

## 2019-01-08 DIAGNOSIS — D696 Thrombocytopenia, unspecified: Secondary | ICD-10-CM | POA: Diagnosis not present

## 2019-01-08 DIAGNOSIS — T50902A Poisoning by unspecified drugs, medicaments and biological substances, intentional self-harm, initial encounter: Secondary | ICD-10-CM

## 2019-01-08 DIAGNOSIS — Z7901 Long term (current) use of anticoagulants: Secondary | ICD-10-CM | POA: Insufficient documentation

## 2019-01-08 DIAGNOSIS — G894 Chronic pain syndrome: Secondary | ICD-10-CM | POA: Insufficient documentation

## 2019-01-08 DIAGNOSIS — R0681 Apnea, not elsewhere classified: Secondary | ICD-10-CM

## 2019-01-08 DIAGNOSIS — Z87892 Personal history of anaphylaxis: Secondary | ICD-10-CM | POA: Insufficient documentation

## 2019-01-08 DIAGNOSIS — D509 Iron deficiency anemia, unspecified: Secondary | ICD-10-CM

## 2019-01-08 DIAGNOSIS — M329 Systemic lupus erythematosus, unspecified: Secondary | ICD-10-CM | POA: Insufficient documentation

## 2019-01-08 DIAGNOSIS — Z86718 Personal history of other venous thrombosis and embolism: Secondary | ICD-10-CM | POA: Insufficient documentation

## 2019-01-08 DIAGNOSIS — Z86711 Personal history of pulmonary embolism: Secondary | ICD-10-CM | POA: Diagnosis not present

## 2019-01-08 DIAGNOSIS — F1910 Other psychoactive substance abuse, uncomplicated: Secondary | ICD-10-CM | POA: Insufficient documentation

## 2019-01-08 DIAGNOSIS — Z91040 Latex allergy status: Secondary | ICD-10-CM | POA: Insufficient documentation

## 2019-01-08 DIAGNOSIS — M797 Fibromyalgia: Secondary | ICD-10-CM | POA: Insufficient documentation

## 2019-01-08 DIAGNOSIS — R109 Unspecified abdominal pain: Secondary | ICD-10-CM

## 2019-01-08 DIAGNOSIS — T403X1A Poisoning by methadone, accidental (unintentional), initial encounter: Secondary | ICD-10-CM | POA: Diagnosis not present

## 2019-01-08 DIAGNOSIS — D225 Melanocytic nevi of trunk: Secondary | ICD-10-CM | POA: Insufficient documentation

## 2019-01-08 DIAGNOSIS — R161 Splenomegaly, not elsewhere classified: Secondary | ICD-10-CM | POA: Diagnosis not present

## 2019-01-08 DIAGNOSIS — G9340 Encephalopathy, unspecified: Secondary | ICD-10-CM

## 2019-01-08 DIAGNOSIS — Z881 Allergy status to other antibiotic agents status: Secondary | ICD-10-CM | POA: Insufficient documentation

## 2019-01-08 DIAGNOSIS — Z888 Allergy status to other drugs, medicaments and biological substances status: Secondary | ICD-10-CM | POA: Insufficient documentation

## 2019-01-08 DIAGNOSIS — Z8673 Personal history of transient ischemic attack (TIA), and cerebral infarction without residual deficits: Secondary | ICD-10-CM | POA: Insufficient documentation

## 2019-01-08 DIAGNOSIS — Z915 Personal history of self-harm: Secondary | ICD-10-CM | POA: Insufficient documentation

## 2019-01-08 DIAGNOSIS — E876 Hypokalemia: Secondary | ICD-10-CM | POA: Diagnosis not present

## 2019-01-08 DIAGNOSIS — D61818 Other pancytopenia: Secondary | ICD-10-CM | POA: Insufficient documentation

## 2019-01-08 DIAGNOSIS — E119 Type 2 diabetes mellitus without complications: Secondary | ICD-10-CM | POA: Insufficient documentation

## 2019-01-08 DIAGNOSIS — I959 Hypotension, unspecified: Secondary | ICD-10-CM

## 2019-01-08 DIAGNOSIS — I110 Hypertensive heart disease with heart failure: Secondary | ICD-10-CM | POA: Insufficient documentation

## 2019-01-08 DIAGNOSIS — Z8249 Family history of ischemic heart disease and other diseases of the circulatory system: Secondary | ICD-10-CM | POA: Insufficient documentation

## 2019-01-08 LAB — TROPONIN I (HIGH SENSITIVITY): Troponin I (High Sensitivity): 3 ng/L (ref <18)

## 2019-01-08 LAB — URINALYSIS, COMPLETE (UACMP) WITH MICROSCOPIC
Bilirubin Urine: NEGATIVE
Glucose, UA: NEGATIVE mg/dL
Hgb urine dipstick: NEGATIVE
Ketones, ur: NEGATIVE mg/dL
Leukocytes,Ua: NEGATIVE
Nitrite: NEGATIVE
Protein, ur: NEGATIVE mg/dL
Specific Gravity, Urine: 1.005 (ref 1.005–1.030)
pH: 7 (ref 5.0–8.0)

## 2019-01-08 LAB — COMPREHENSIVE METABOLIC PANEL
ALT: 11 U/L (ref 0–44)
AST: 14 U/L — ABNORMAL LOW (ref 15–41)
Albumin: 2.6 g/dL — ABNORMAL LOW (ref 3.5–5.0)
Alkaline Phosphatase: 46 U/L (ref 38–126)
Anion gap: 7 (ref 5–15)
BUN: 11 mg/dL (ref 6–20)
CO2: 18 mmol/L — ABNORMAL LOW (ref 22–32)
Calcium: 6.3 mg/dL — CL (ref 8.9–10.3)
Chloride: 118 mmol/L — ABNORMAL HIGH (ref 98–111)
Creatinine, Ser: 0.54 mg/dL (ref 0.44–1.00)
GFR calc Af Amer: >60 mL/min (ref >60)
GFR calc non Af Amer: >60 mL/min (ref >60)
Glucose, Bld: 66 mg/dL — ABNORMAL LOW (ref 70–99)
Potassium: 2.7 mmol/L — CL (ref 3.5–5.1)
Sodium: 143 mmol/L (ref 135–145)
Total Bilirubin: 0.7 mg/dL (ref 0.3–1.2)
Total Protein: 4.9 g/dL — ABNORMAL LOW (ref 6.5–8.1)

## 2019-01-08 LAB — URINE DRUG SCREEN, QUALITATIVE (ARMC ONLY)
Amphetamines, Ur Screen: NOT DETECTED
Barbiturates, Ur Screen: NOT DETECTED
Benzodiazepine, Ur Scrn: NOT DETECTED
Cannabinoid 50 Ng, Ur ~~LOC~~: NOT DETECTED
Cocaine Metabolite,Ur ~~LOC~~: NOT DETECTED
MDMA (Ecstasy)Ur Screen: NOT DETECTED
Methadone Scn, Ur: NOT DETECTED
Opiate, Ur Screen: NOT DETECTED
Phencyclidine (PCP) Ur S: NOT DETECTED
Tricyclic, Ur Screen: NOT DETECTED

## 2019-01-08 LAB — CBC WITH DIFFERENTIAL/PLATELET
Abs Immature Granulocytes: 0.01 10*3/uL (ref 0.00–0.07)
Basophils Absolute: 0 10*3/uL (ref 0.0–0.1)
Basophils Relative: 1 %
Eosinophils Absolute: 0 10*3/uL (ref 0.0–0.5)
Eosinophils Relative: 1 %
HCT: 26.8 % — ABNORMAL LOW (ref 36.0–46.0)
Hemoglobin: 8.6 g/dL — ABNORMAL LOW (ref 12.0–15.0)
Immature Granulocytes: 0 %
Lymphocytes Relative: 29 %
Lymphs Abs: 0.8 10*3/uL (ref 0.7–4.0)
MCH: 28.4 pg (ref 26.0–34.0)
MCHC: 32.1 g/dL (ref 30.0–36.0)
MCV: 88.4 fL (ref 80.0–100.0)
Monocytes Absolute: 0.2 10*3/uL (ref 0.1–1.0)
Monocytes Relative: 7 %
Neutro Abs: 1.7 10*3/uL (ref 1.7–7.7)
Neutrophils Relative %: 62 %
Platelets: 76 10*3/uL — ABNORMAL LOW (ref 150–400)
RBC: 3.03 MIL/uL — ABNORMAL LOW (ref 3.87–5.11)
RDW: 13.8 % (ref 11.5–15.5)
Smear Review: NORMAL
WBC: 2.8 10*3/uL — ABNORMAL LOW (ref 4.0–10.5)
nRBC: 0 % (ref 0.0–0.2)

## 2019-01-08 LAB — MAGNESIUM: Magnesium: 1.5 mg/dL — ABNORMAL LOW (ref 1.7–2.4)

## 2019-01-08 LAB — ACETAMINOPHEN LEVEL: Acetaminophen (Tylenol), Serum: <10 ug/mL — ABNORMAL LOW (ref 10–30)

## 2019-01-08 LAB — SALICYLATE LEVEL: Salicylate Lvl: <7 mg/dL (ref 2.8–30.0)

## 2019-01-08 LAB — POCT PREGNANCY, URINE: Preg Test, Ur: NEGATIVE

## 2019-01-08 LAB — POC SARS CORONAVIRUS 2 AG: SARS Coronavirus 2 Ag: NEGATIVE

## 2019-01-08 LAB — ETHANOL: Alcohol, Ethyl (B): <10 mg/dL (ref <10)

## 2019-01-08 MED ORDER — PREGABALIN 75 MG PO CAPS
200.0000 mg | ORAL_CAPSULE | Freq: Two times a day (BID) | ORAL | Status: DC
  Administered 2019-01-08 – 2019-01-09 (×2): 200 mg via ORAL
  Filled 2019-01-08: qty 4
  Filled 2019-01-08: qty 1

## 2019-01-08 MED ORDER — ONDANSETRON HCL 4 MG/2ML IJ SOLN
4.0000 mg | Freq: Four times a day (QID) | INTRAMUSCULAR | Status: DC | PRN
  Administered 2019-01-08: 4 mg via INTRAVENOUS
  Filled 2019-01-08: qty 2

## 2019-01-08 MED ORDER — POTASSIUM CHLORIDE CRYS ER 20 MEQ PO TBCR
40.0000 meq | EXTENDED_RELEASE_TABLET | Freq: Once | ORAL | Status: AC
  Administered 2019-01-08: 40 meq via ORAL
  Filled 2019-01-08: qty 2

## 2019-01-08 MED ORDER — SODIUM CHLORIDE 0.9 % IV BOLUS
1000.0000 mL | Freq: Once | INTRAVENOUS | Status: AC
  Administered 2019-01-08: 10:00:00 1000 mL via INTRAVENOUS

## 2019-01-08 MED ORDER — SODIUM CHLORIDE 0.9 % IV BOLUS
1000.0000 mL | Freq: Once | INTRAVENOUS | Status: AC
  Administered 2019-01-08: 09:00:00 1000 mL via INTRAVENOUS

## 2019-01-08 MED ORDER — PROPRANOLOL HCL ER 120 MG PO CP24
120.0000 mg | ORAL_CAPSULE | Freq: Every day | ORAL | Status: DC
  Filled 2019-01-08: qty 1

## 2019-01-08 MED ORDER — ENOXAPARIN SODIUM 80 MG/0.8ML ~~LOC~~ SOLN: 40.0000 mg | SUBCUTANEOUS | Status: DC

## 2019-01-08 MED ORDER — ENOXAPARIN SODIUM 80 MG/0.8ML ~~LOC~~ SOLN
80.0000 mg | Freq: Two times a day (BID) | SUBCUTANEOUS | Status: DC
  Administered 2019-01-08: 80 mg via SUBCUTANEOUS
  Filled 2019-01-08 (×2): qty 0.8

## 2019-01-08 MED ORDER — FLUTICASONE PROPIONATE 50 MCG/ACT NA SUSP
2.0000 | Freq: Every day | NASAL | Status: DC
  Administered 2019-01-09: 2 via NASAL
  Filled 2019-01-08 (×2): qty 16

## 2019-01-08 MED ORDER — ACETAMINOPHEN 325 MG PO TABS: 650.0000 mg | ORAL_TABLET | ORAL | Status: DC | PRN

## 2019-01-08 MED ORDER — ACETAMINOPHEN 325 MG PO TABS: 650.0000 mg | ORAL_TABLET | Freq: Four times a day (QID) | ORAL | Status: DC | PRN

## 2019-01-08 MED ORDER — TRAZODONE HCL 50 MG PO TABS
300.0000 mg | ORAL_TABLET | Freq: Every day | ORAL | Status: DC
  Filled 2019-01-08: qty 3

## 2019-01-08 MED ORDER — NALOXONE HCL 2 MG/2ML IJ SOSY
0.4000 mg | PREFILLED_SYRINGE | Freq: Once | INTRAMUSCULAR | Status: AC
  Administered 2019-01-08: 0.4 mg via INTRAVENOUS
  Filled 2019-01-08: qty 2

## 2019-01-08 MED ORDER — METHOCARBAMOL 750 MG PO TABS
750.0000 mg | ORAL_TABLET | Freq: Three times a day (TID) | ORAL | Status: DC | PRN
  Filled 2019-01-08 (×2): qty 1

## 2019-01-08 MED ORDER — ONDANSETRON HCL 4 MG PO TABS
8.0000 mg | ORAL_TABLET | Freq: Three times a day (TID) | ORAL | Status: DC
  Administered 2019-01-08 – 2019-01-09 (×3): 8 mg via ORAL
  Filled 2019-01-08 (×4): qty 2

## 2019-01-08 MED ORDER — ENOXAPARIN SODIUM 80 MG/0.8ML ~~LOC~~ SOLN: 80.0000 mg | Freq: Two times a day (BID) | SUBCUTANEOUS | Status: DC

## 2019-01-08 MED ORDER — RIFAXIMIN 550 MG PO TABS
550.0000 mg | ORAL_TABLET | Freq: Two times a day (BID) | ORAL | Status: DC
  Administered 2019-01-08 – 2019-01-09 (×2): 550 mg via ORAL
  Filled 2019-01-08 (×5): qty 1

## 2019-01-08 MED ORDER — GABAPENTIN 300 MG PO CAPS
800.0000 mg | ORAL_CAPSULE | Freq: Three times a day (TID) | ORAL | Status: DC
  Administered 2019-01-09 (×2): 800 mg via ORAL
  Filled 2019-01-08 (×4): qty 2

## 2019-01-08 MED ORDER — MAGNESIUM SULFATE 4 GM/100ML IV SOLN
4.0000 g | Freq: Once | INTRAVENOUS | Status: AC
  Administered 2019-01-08: 13:00:00 4 g via INTRAVENOUS
  Filled 2019-01-08: qty 100

## 2019-01-08 MED ORDER — DICYCLOMINE HCL 10 MG PO CAPS
10.0000 mg | ORAL_CAPSULE | Freq: Three times a day (TID) | ORAL | Status: DC
  Administered 2019-01-08 – 2019-01-09 (×3): 10 mg via ORAL
  Filled 2019-01-08 (×6): qty 1

## 2019-01-08 MED ORDER — TIZANIDINE HCL 4 MG PO TABS
4.0000 mg | ORAL_TABLET | Freq: Four times a day (QID) | ORAL | Status: DC | PRN
  Filled 2019-01-08 (×2): qty 1

## 2019-01-08 MED ORDER — OXYCODONE HCL 5 MG PO TABS
10.0000 mg | ORAL_TABLET | ORAL | Status: DC | PRN
  Administered 2019-01-08 – 2019-01-09 (×3): 10 mg via ORAL
  Filled 2019-01-08 (×7): qty 2

## 2019-01-08 MED ORDER — BUPROPION HCL 100 MG PO TABS
100.0000 mg | ORAL_TABLET | Freq: Every day | ORAL | Status: DC
  Administered 2019-01-08 – 2019-01-09 (×2): 100 mg via ORAL
  Filled 2019-01-08 (×5): qty 1

## 2019-01-08 MED ORDER — METHADONE HCL 10 MG PO TABS
70.0000 mg | ORAL_TABLET | Freq: Every day | ORAL | Status: DC
  Administered 2019-01-09: 70 mg via ORAL
  Filled 2019-01-08: qty 7

## 2019-01-08 MED ORDER — NALOXONE HCL 2 MG/2ML IJ SOSY
0.5000 mg/h | PREFILLED_SYRINGE | INTRAVENOUS | Status: DC
  Administered 2019-01-08: 0.5 mg/h via INTRAVENOUS
  Filled 2019-01-08: qty 4

## 2019-01-08 MED ORDER — ENOXAPARIN SODIUM 40 MG/0.4ML ~~LOC~~ SOLN: 40.0000 mg | SUBCUTANEOUS | Status: DC

## 2019-01-08 MED ORDER — LURASIDONE HCL 40 MG PO TABS
40.0000 mg | ORAL_TABLET | Freq: Every day | ORAL | Status: DC
  Administered 2019-01-09: 40 mg via ORAL
  Filled 2019-01-08 (×2): qty 1

## 2019-01-08 MED ORDER — PROMETHAZINE HCL 25 MG PO TABS
12.5000 mg | ORAL_TABLET | Freq: Three times a day (TID) | ORAL | Status: DC | PRN
  Filled 2019-01-08 (×2): qty 1

## 2019-01-08 MED ORDER — ZOLPIDEM TARTRATE 5 MG PO TABS
5.0000 mg | ORAL_TABLET | Freq: Every day | ORAL | Status: DC
  Filled 2019-01-08: qty 1

## 2019-01-08 MED ORDER — HYDROXYZINE HCL 50 MG PO TABS
50.0000 mg | ORAL_TABLET | Freq: Three times a day (TID) | ORAL | Status: DC | PRN
  Filled 2019-01-08: qty 1

## 2019-01-08 MED ORDER — DICLOFENAC SODIUM 1 % EX GEL
2.0000 g | Freq: Four times a day (QID) | CUTANEOUS | Status: DC
  Filled 2019-01-08 (×3): qty 100

## 2019-01-08 MED ORDER — SODIUM CHLORIDE 0.9 % IV BOLUS
1000.0000 mL | Freq: Once | INTRAVENOUS | Status: AC
  Administered 2019-01-08: 06:00:00 1000 mL via INTRAVENOUS

## 2019-01-08 MED ORDER — ASPIRIN 81 MG PO CHEW
81.0000 mg | CHEWABLE_TABLET | Freq: Every day | ORAL | Status: DC
  Administered 2019-01-09: 10:00:00 81 mg via ORAL
  Filled 2019-01-08: qty 1

## 2019-01-08 MED ORDER — PANTOPRAZOLE SODIUM 40 MG PO TBEC
40.0000 mg | DELAYED_RELEASE_TABLET | Freq: Every day | ORAL | Status: DC
  Administered 2019-01-09: 40 mg via ORAL
  Filled 2019-01-08: qty 1

## 2019-01-08 MED ORDER — CYANOCOBALAMIN 1000 MCG/ML IJ SOLN
1000.0000 ug | Freq: Once | INTRAMUSCULAR | Status: AC
  Administered 2019-01-09: 1000 ug via INTRAMUSCULAR
  Filled 2019-01-08 (×3): qty 1

## 2019-01-08 MED ORDER — POTASSIUM CHLORIDE 10 MEQ/100ML IV SOLN
10.0000 meq | INTRAVENOUS | Status: DC
  Administered 2019-01-08 (×4): 10 meq via INTRAVENOUS
  Filled 2019-01-08 (×6): qty 100

## 2019-01-08 MED ORDER — ACETAMINOPHEN 650 MG RE SUPP: 650.0000 mg | Freq: Four times a day (QID) | RECTAL | Status: DC | PRN

## 2019-01-08 MED ORDER — POTASSIUM CHLORIDE 10 MEQ/100ML IV SOLN
10.0000 meq | INTRAVENOUS | Status: AC
  Administered 2019-01-08 (×2): 10 meq via INTRAVENOUS
  Filled 2019-01-08 (×4): qty 100

## 2019-01-08 MED ORDER — ZOLPIDEM TARTRATE 10 MG PO TABS: 10.0000 mg | ORAL_TABLET | Freq: Every day | ORAL | Status: DC

## 2019-01-08 MED ORDER — TOPIRAMATE 25 MG PO TABS
50.0000 mg | ORAL_TABLET | Freq: Three times a day (TID) | ORAL | Status: DC
  Administered 2019-01-08 – 2019-01-09 (×2): 50 mg via ORAL
  Filled 2019-01-08 (×5): qty 2

## 2019-01-08 MED ORDER — VORTIOXETINE HBR 5 MG PO TABS
20.0000 mg | ORAL_TABLET | Freq: Every day | ORAL | Status: DC
  Administered 2019-01-09: 20 mg via ORAL
  Filled 2019-01-08 (×3): qty 4

## 2019-01-08 NOTE — Progress Notes (Signed)
PATIENT IS TO FOLLOW UP WITH PCP IN 1 Week

## 2019-01-08 NOTE — ED Notes (Signed)
Pt with iv in left ac not flushing or pulling back. Pt insists on iv to be removed, removed with int intact. Pt declines to have potassium infusion in left hand. Potassium stopped at this time.

## 2019-01-08 NOTE — Discharge Instructions (Signed)
Accidental Drug Poisoning, Adult Accidental drug poisoning happens when a person accidentally takes too much of a substance, such as a prescription medicine, an over-the-counter medicine, a vitamin, a supplement, or an illegal drug. The effects of drug poisoning can be mild, dangerous, or even deadly. What are the causes? This condition is caused by taking too much of a medicine, illegal drug, or other substance. It often results from:  Lack of knowledge about a substance.  Using more than one substance at the same time.  An error made by the health care provider who prescribed the substance.  An error made by the pharmacist who filled the prescription.  A lapse in memory, such as forgetting that you have already taken a dose of the medicine.  Suddenly using a substance after a long period of not using it. The following substances and medicines are more likely to cause an accidental drug poisoning:  Medicines that treat mental problems (psychotropic medicines).  Pain medicines.  Cocaine.  Heroin.  Multivitamins that contain iron.  Over-the-counter cold and cough medicines. What increases the risk? This condition is more likely to occur in:  Elderly adults. Elderly adults are at risk because they may: ? Be taking many different medicines. ? Have difficulty reading labels. ? Forget when they last took their medicine.  People who use illegal drugs.  People who drink alcohol while using illegal drugs or certain medicines.  People with certain mental health conditions. What are the signs or symptoms? Symptoms of this condition depend on the substance and the amount that was taken. Common symptoms include:  Behavior changes, such as confusion.  Sleepiness.  Weakness.  Slowed breathing.  Nausea and vomiting.  Seizures.  Very large or small eye pupil size. A drug poisoning can cause a very serious condition in which your blood pressure drops to a low level (shock).  Symptoms of shock include:  Cold and clammy skin.  Pale skin.  Blue lips.  Very slow breathing.  Extreme sleepiness.  Severe confusion.  Dizziness or fainting. How is this diagnosed? This condition is diagnosed based on:  Your symptoms. You will be asked about the substances you took and when you took them.  A physical exam. You may also have other tests, including:  Urine tests.  Blood tests.  An electrocardiogram (ECG). How is this treated? This condition may need to be treated right away at the hospital. Treatment may involve:  Getting fluids and electrolytes through an IV.  Having a breathing tube inserted in your airway (endotracheal tube) to help you breathe.  Taking medicines. These may include medicines that: ? Absorb any substance that is in your digestive system. ? Block or reverse the effect of the substance that caused the drug poisoning.  Having your blood filtered through an artificial kidney machine (hemodialysis).  Ongoing counseling and mental health support. This may be provided if you used an illegal drug. Follow these instructions at home: Medicines   Take over-the-counter and prescription medicines only as told by your health care provider.  Before taking a new medicine, ask your health care provider whether the medicine: ? May cause side effects. ? Might react with other medicines.  Keep a list of all the medicines that you take, including over-the-counter medicines, vitamins, supplements, and herbs. Bring this list with you to all of your medical visits. General instructions   Drink enough fluid to keep your urine pale yellow.  If you are working with a Social worker or Education officer, community, make  sure to follow his or her instructions.  Do not drink alcohol if: ? Your health care provider tells you not to drink. ? You are pregnant, may be pregnant, or are planning to become pregnant.  If you drink alcohol, limit how much you  have: ? 0-1 drink a day for women. ? 0-2 drinks a day for men.  Be aware of how much alcohol is in your drink. In the [place] one drink equals one typical bottle of beer (12 oz), one-half glass of wine (5 oz), or one shot of hard liquor (1 oz).  Keep all follow-up visits as told by your health care provider. This is important. How is this prevented?   Get help if you are struggling with: ? Alcohol or drug use. ? Depression or another mental health problem.  Keep the phone number of your local poison control center near your phone or on your cell phone. The hotline of the [employer] is (800(216)260-2784.  Store all medicines in safety containers that are out of the reach of children.  Read the drug inserts that come with your medicines.  Create a system for taking your medicine, such as a pillbox, that will help you avoid taking too much of the medicine.  Do not drink alcohol while taking medicines unless your health care provider approves.  Do not use illegal drugs.  Do not take medicines that are not prescribed for you. Contact a health care provider if:  Your symptoms return.  You develop new symptoms or side effects after taking a medicine.  You have questions about possible drug poisoning. Call your local poison control center at (800) 321-206-4030. Get help right away if:  You think that you or someone else may have taken too much of a substance.  You or someone else is having symptoms of drug poisoning. Summary  Accidental drug poisoning happens when a person accidentally takes too much of a substance, such as a prescription medicine, an over-the-counter medicine, a vitamin, a supplement, or an illegal drug.  The effects of drug poisoning can be mild, dangerous, or even deadly.  This condition is diagnosed based on your symptoms and a physical exam. You will be asked to tell your health care provider which substances you took and when you  took them.  This condition may need to be treated right away at the hospital. This information is not intended to replace advice given to you by your health care provider. Make sure you discuss any questions you have with your health care provider. Document Released: 04/20/2004 Document Revised: 01/17/2017 Document Reviewed: 01/06/2017 Elsevier Patient Education  [place] PCP in 1 week

## 2019-01-08 NOTE — ED Notes (Signed)
IV team at bedside 

## 2019-01-08 NOTE — ED Notes (Signed)
Ambulated to BR with minimal assist.  Patient oriented x 3.  Gait steady.  MAE.  Tolerated well.

## 2019-01-08 NOTE — ED Notes (Signed)
ICU refusing to take patient at this time.

## 2019-01-08 NOTE — ED Notes (Signed)
Dr. Mortimer Fries notified of hypotension.  1L NS IVB ordered.  Will continue to monitor.

## 2019-01-08 NOTE — ED Notes (Addendum)
Psych assessing patient
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
## 2019-01-08 NOTE — Consult Note (Signed)
PHARMACY CONSULT NOTE - FOLLOW UP  Pharmacy Consult for Electrolyte Monitoring and Replacement   Recent Labs: Potassium (mmol/L)  Date Value  01/08/2019 2.7 (LL)   Magnesium (mg/dL)  Date Value  01/08/2019 1.5 (L)   Calcium (mg/dL)  Date Value  01/08/2019 6.3 (LL)   Albumin (g/dL)  Date Value  01/08/2019 2.6 (L)  01/08/2018 4.3   Phosphorus (mg/dL)  Date Value  03/12/2016 4.3   Sodium (mmol/L)  Date Value  01/08/2019 143   Corrected calcium: 7.4 - phosphate WNL - no PTH or vit D level Copper, zinc, folate, iron studies, b12 pending  Assessment: Patient is a 48 y/o F with medical history including chronic abdominal pain, malabsorption syndrome, Roux-en-Y gastric bypass, chronic pain syndrome, history of overdose on methadone and benzodiazepines who presented c/o suspected methadone overdose. Urine drug screen negative. ED EKG significant for prolonged QT. Labs significant for hypokalemia, hypocalcemia, hypomagnesemia, pancytopenia. Labs indicate possible NAGMA- topiramate could contribute. Patient is also on esomeprazole BID which may contribute to malabsorption. Pharmacy has been consulted to replace potassium orally.   Patient has received magnesium 4 g IV x1 and potassium 10 mEq IV x 4 so far this admission. She is also further ordered potassium 10 mEq IV x 4. CrCl 70-80 mL/min. Un-clear how much she will absorb orally given her history.  Goal of Therapy:  K ~4, Mg ~2  Plan:  -Potassium 40 mEq x 1  -Re-check K, Mg in morning  [place] Resident 01/08/2019 5:09 PM

## 2019-01-08 NOTE — ED Notes (Signed)
Pt refused meal tray. Pt did take an apple juice.

## 2019-01-08 NOTE — ED Triage Notes (Signed)
Pt to ED via EMS from home. Per ems empty bottles of methadone laying around, pt arrives responsive to pain, breathing and sats WDL. Ems gave 4mg  narcan with improvement. VSS.
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
## 2019-01-08 NOTE — Discharge Summary (Addendum)
Physician Discharge Summary  Patient ID: ALICEN DONALSON MRN: [id number] DOB/AGE: 18-Jan-1971 44 y.o.  Admit date: 01/08/2019 Discharge date: 01/08/2019  Admission Diagnoses:OVERDOSE  Discharge Diagnoses:  Active Problems:   Methadone overdose ([place])   Discharged Condition: STABLE PSYCHIATRIC EVALUATION HAS RESCINDED INVOLUNTARY COMMITMENT PATIENT DOES NOT REQUIRE MEDICAL ADMISSION  ED COURSE NARCAN INFUSION SALINE BOLUSES AND IVF'S POTASSIUM REPLACEMENT  PATIENT ALERT AND AWAKE VS STABLE  OK TO D/C HOME   Consults: PSYCH   Disposition: HOME   Allergies as of 01/08/2019      Reactions   Amoxicillin Anaphylaxis   Augmentin [amoxicillin-pot Clavulanate] Anaphylaxis   Betadine [povidone Iodine] Anaphylaxis   Ciprofloxacin Anaphylaxis   Erythromycin Anaphylaxis   Latex Anaphylaxis   Penicillins Anaphylaxis   Has patient had a PCN reaction causing immediate rash, facial/tongue/throat swelling, SOB or lightheadedness with hypotension: yes Has patient had a PCN reaction causing severe rash involving mucus membranes or skin necrosis: no Has patient had a PCN reaction that required hospitalization no Has patient had a PCN reaction occurring within the last 10 years: no If all of the above answers are "NO", then may proceed with Cephalosporin use.   Adhesive [tape] Other (See Comments)   Skin "bubbles" and blisters   Lisinopril Cough      Medication List    TAKE these medications   aspirin 81 MG chewable tablet Chew 1 tablet (81 mg total) by mouth daily.   B-D 3CC LUER-LOK SYR 25GX1" 25G X 1" 3 ML Misc Generic drug: SYRINGE-NEEDLE (DISP) 3 ML AS DIRECTED FOR 90 DAYS   blood glucose meter kit and supplies Kit Dispense based on patient and insurance preference. Use once daily fasting and as needed. (FOR ICD-9 250.00, 250.01).   buPROPion 100 MG tablet Commonly known as: WELLBUTRIN Take 100 mg by mouth daily.   cloNIDine 0.1 MG tablet Commonly known as:  CATAPRES Take 0.1 mg by mouth 2 (two) times daily.   cyanocobalamin 1000 MCG/ML injection Commonly known as: (VITAMIN B-12) INJECT 1 ML (1,000 MCG TOTAL) INTO THE SKIN ONCE A WEEK FOR 10 DOSES. WEEKLY FOR 4 WEEKS THEN MONTH* What changed: See the new instructions.   diclofenac sodium 1 % Gel Commonly known as: VOLTAREN Apply 2 g topically 4 (four) times daily. (apply to back and legs)   dicyclomine 10 MG capsule Commonly known as: BENTYL Take 1 capsule (10 mg total) by mouth 4 (four) times daily -  before meals and at bedtime.   enoxaparin 80 MG/0.8ML injection Commonly known as: LOVENOX Inject 80 mg into the skin every 12 (twelve) hours.   esomeprazole 40 MG capsule Commonly known as: NEXIUM Take 1 capsule (40 mg total) by mouth 2 (two) times daily before a meal.   fluticasone 50 MCG/ACT nasal spray Commonly known as: FLONASE Place 2 sprays into both nostrils daily.   gabapentin 400 MG capsule Commonly known as: NEURONTIN Take 800 mg by mouth 3 (three) times daily.   hydrOXYzine 50 MG tablet Commonly known as: ATARAX/VISTARIL Take 50 mg by mouth 3 (three) times daily as needed for anxiety.   lurasidone 40 MG Tabs tablet Commonly known as: LATUDA Take 40 mg by mouth daily.   methadone 10 MG tablet Commonly known as: DOLOPHINE Take 70 mg by mouth daily.   methocarbamol 750 MG tablet Commonly known as: ROBAXIN Take 750 mg by mouth 3 (three) times daily. **pt states she takes drug as needed**   naloxone 0.4 MG/ML injection Commonly known as: NARCAN To be  used as needed for overdose   ondansetron 8 MG tablet Commonly known as: ZOFRAN Take 8 mg by mouth 3 (three) times daily.   Oxycodone HCl 10 MG Tabs Take 10 mg by mouth every 4 (four) hours as needed (pain).   pregabalin 100 MG capsule Commonly known as: LYRICA Take 200 mg by mouth 2 (two) times daily.   promethazine 12.5 MG tablet Commonly known as: PHENERGAN Take 12.5 mg by mouth every 8 (eight) hours  as needed for nausea or vomiting.   propranolol ER 120 MG 24 hr capsule Commonly known as: INDERAL LA Take 120 mg by mouth daily.   rifaximin 550 MG Tabs tablet Commonly known as: XIFAXAN Take 1 tablet (550 mg total) by mouth 2 (two) times daily for 14 days.   tiZANidine 4 MG tablet Commonly known as: ZANAFLEX Take 4 mg by mouth 4 (four) times daily as needed for muscle spasms.   topiramate 50 MG tablet Commonly known as: TOPAMAX Take 50 mg by mouth 3 (three) times daily.   trazodone 300 MG tablet Commonly known as: DESYREL Take 300 mg by mouth at bedtime.   vortioxetine HBr 20 MG Tabs tablet Commonly known as: TRINTELLIX Take 20 mg by mouth daily.   zolpidem 10 MG tablet Commonly known as: AMBIEN Take 10 mg by mouth at bedtime.       Signed: Flora Lipps 01/08/2019, 3:13 PM
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
## 2019-01-08 NOTE — ED Notes (Addendum)
Patient is AAOx3.  Calm and cooperative.  Does not recall what happened last night.  Denies SI/ HI.  Denies OD.  States "I don't have any medications (pain) at home to even take".  States she has been sick lately, describing numerous stomach issues.  States her GI physician is aware.  Discussed immediate plan of care.  Understanding voiced.  Dr. Mortimer Fries notified.

## 2019-01-08 NOTE — ED Notes (Signed)
Pt awakens to voice and has slightly improved since coming in to the ED. EDP would like to monitor pt and rehydrate her in order to get blood work done. Will continue to monitor.

## 2019-01-09 DIAGNOSIS — T50902A Poisoning by unspecified drugs, medicaments and biological substances, intentional self-harm, initial encounter: Secondary | ICD-10-CM | POA: Diagnosis not present

## 2019-01-09 DIAGNOSIS — E876 Hypokalemia: Secondary | ICD-10-CM | POA: Diagnosis not present

## 2019-01-09 DIAGNOSIS — I959 Hypotension, unspecified: Secondary | ICD-10-CM | POA: Diagnosis not present

## 2019-01-09 DIAGNOSIS — R109 Unspecified abdominal pain: Secondary | ICD-10-CM | POA: Diagnosis not present

## 2019-01-09 LAB — POTASSIUM: Potassium: 4.2 mmol/L (ref 3.5–5.1)

## 2019-01-09 LAB — MAGNESIUM: Magnesium: 2.4 mg/dL (ref 1.7–2.4)

## 2019-01-09 LAB — SARS CORONAVIRUS 2 (TAT 6-24 HRS): SARS Coronavirus 2: NEGATIVE

## 2019-01-09 MED ORDER — ZOLPIDEM TARTRATE 5 MG PO TABS: 5.0000 mg | ORAL_TABLET | Freq: Every evening | ORAL | Status: DC | PRN

## 2019-01-09 MED ORDER — ENOXAPARIN SODIUM 80 MG/0.8ML ~~LOC~~ SOLN
1.0000 mg/kg | Freq: Two times a day (BID) | SUBCUTANEOUS | Status: DC
  Administered 2019-01-09: 10:00:00 75 mg via SUBCUTANEOUS
  Filled 2019-01-09 (×2): qty 0.8

## 2019-01-09 NOTE — Progress Notes (Signed)
Pt is being discharged home.  Discharge papers given and explained to pt. Pt verbalized understanding. Meds and f/u appointments reviewed. No Rx at this time. Awaiting transportation. 
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
## 2019-01-09 NOTE — TOC Transition Note (Signed)
Transition of Care [facility]) - CM/SW Discharge Note   Patient Details  Name: Leslie Marks MRN: [id number] Date of Birth: March 10, 1970  [facility] of [facility] CM/SW Contact:  Marshell Garfinkel, RN Phone Number: 01/09/2019, 8:43 AM   Clinical Narrative:     IVC order still in orders; discharge order also in play. RN updated.         Patient Goals and CMS Choice        Discharge Placement                       Discharge Plan and Services                                     Social Determinants of Health (SDOH) Interventions     Readmission Risk Interventions Readmission Risk Prevention Plan 11/26/2018  Transportation Screening Complete  PCP or Specialist Appt within 3-5 Days Complete  HRI or [place] Complete  Social Work Consult for [place] Planning/Counseling Complete  Palliative Care Screening Not Applicable  Medication Review [employer]) Complete  Some recent data might be hidden

## 2019-01-09 NOTE — Consult Note (Signed)
PHARMACY CONSULT NOTE - FOLLOW UP  Pharmacy Consult for Electrolyte Monitoring and Replacement   Recent Labs: Potassium (mmol/L)  Date Value  01/09/2019 4.2   Magnesium (mg/dL)  Date Value  01/09/2019 2.4   Calcium (mg/dL)  Date Value  01/08/2019 6.3 (LL)   Albumin (g/dL)  Date Value  01/08/2019 2.6 (L)  01/08/2018 4.3   Phosphorus (mg/dL)  Date Value  03/12/2016 4.3   Sodium (mmol/L)  Date Value  01/08/2019 143   Assessment: Patient is a 48 y/o F with medical history including chronic abdominal pain, malabsorption syndrome, Roux-en-Y gastric bypass, chronic pain syndrome, history of overdose on methadone and benzodiazepines who presented c/o suspected methadone overdose. Urine drug screen negative. ED EKG significant for prolonged QT. Labs significant for hypokalemia, hypocalcemia, hypomagnesemia, pancytopenia. Labs indicate possible NAGMA- topiramate could contribute. Patient is also on esomeprazole BID which may contribute to malabsorption. Pharmacy has been consulted to replace potassium orally.   Goal of Therapy:  K ~4, Mg ~2  Plan:  No supplementation is warranted at this time.  Recheck K, Mg with am labs.   Olivia Canter [facility] Clinical Pharmacist 01/09/2019 8:45 AM

## 2019-01-09 NOTE — Progress Notes (Signed)
Reassessed bp, inaccurate reading

## 2019-01-09 NOTE — Progress Notes (Signed)
Report given to [place], Therapist, sports. Patient transferred to floor via wheel chair.

## 2019-01-09 NOTE — Plan of Care (Signed)

## 2019-01-10 LAB — URINE CULTURE: Culture: 40000 — AB

## 2019-01-11 ENCOUNTER — Other Ambulatory Visit: Payer: Self-pay

## 2019-01-11 NOTE — Progress Notes (Signed)
Patient pre screened for office appointment, no questions or concerns today. Patient reminded of upcoming appointment time and date. 

## 2019-01-12 ENCOUNTER — Inpatient Hospital Stay: Payer: Medicaid Other

## 2019-01-12 ENCOUNTER — Inpatient Hospital Stay (HOSPITAL_BASED_OUTPATIENT_CLINIC_OR_DEPARTMENT_OTHER): Payer: Medicaid Other | Admitting: Oncology

## 2019-01-12 ENCOUNTER — Other Ambulatory Visit: Payer: Self-pay

## 2019-01-12 VITALS — BP 124/84 | HR 89

## 2019-01-12 VITALS — BP 129/84 | HR 87 | Temp 98.0°F | Resp 16 | Wt 154.6 lb

## 2019-01-12 DIAGNOSIS — K219 Gastro-esophageal reflux disease without esophagitis: Secondary | ICD-10-CM | POA: Diagnosis not present

## 2019-01-12 DIAGNOSIS — Z7982 Long term (current) use of aspirin: Secondary | ICD-10-CM | POA: Diagnosis not present

## 2019-01-12 DIAGNOSIS — E119 Type 2 diabetes mellitus without complications: Secondary | ICD-10-CM | POA: Diagnosis not present

## 2019-01-12 DIAGNOSIS — D509 Iron deficiency anemia, unspecified: Secondary | ICD-10-CM

## 2019-01-12 DIAGNOSIS — Z86711 Personal history of pulmonary embolism: Secondary | ICD-10-CM | POA: Diagnosis not present

## 2019-01-12 DIAGNOSIS — Z86718 Personal history of other venous thrombosis and embolism: Secondary | ICD-10-CM | POA: Diagnosis not present

## 2019-01-12 DIAGNOSIS — D229 Melanocytic nevi, unspecified: Secondary | ICD-10-CM | POA: Diagnosis not present

## 2019-01-12 DIAGNOSIS — M797 Fibromyalgia: Secondary | ICD-10-CM | POA: Diagnosis not present

## 2019-01-12 DIAGNOSIS — D225 Melanocytic nevi of trunk: Secondary | ICD-10-CM | POA: Diagnosis not present

## 2019-01-12 DIAGNOSIS — D61818 Other pancytopenia: Secondary | ICD-10-CM

## 2019-01-12 DIAGNOSIS — E538 Deficiency of other specified B group vitamins: Secondary | ICD-10-CM | POA: Diagnosis not present

## 2019-01-12 DIAGNOSIS — Z8673 Personal history of transient ischemic attack (TIA), and cerebral infarction without residual deficits: Secondary | ICD-10-CM | POA: Diagnosis not present

## 2019-01-12 DIAGNOSIS — Z79899 Other long term (current) drug therapy: Secondary | ICD-10-CM | POA: Diagnosis not present

## 2019-01-12 DIAGNOSIS — F319 Bipolar disorder, unspecified: Secondary | ICD-10-CM | POA: Diagnosis not present

## 2019-01-12 DIAGNOSIS — Z7901 Long term (current) use of anticoagulants: Secondary | ICD-10-CM | POA: Diagnosis not present

## 2019-01-12 DIAGNOSIS — Z9884 Bariatric surgery status: Secondary | ICD-10-CM | POA: Diagnosis not present

## 2019-01-12 DIAGNOSIS — I509 Heart failure, unspecified: Secondary | ICD-10-CM | POA: Diagnosis not present

## 2019-01-12 DIAGNOSIS — I2510 Atherosclerotic heart disease of native coronary artery without angina pectoris: Secondary | ICD-10-CM | POA: Diagnosis not present

## 2019-01-12 DIAGNOSIS — M329 Systemic lupus erythematosus, unspecified: Secondary | ICD-10-CM | POA: Diagnosis not present

## 2019-01-12 DIAGNOSIS — I110 Hypertensive heart disease with heart failure: Secondary | ICD-10-CM | POA: Diagnosis not present

## 2019-01-12 DIAGNOSIS — F419 Anxiety disorder, unspecified: Secondary | ICD-10-CM | POA: Diagnosis not present

## 2019-01-12 DIAGNOSIS — J45909 Unspecified asthma, uncomplicated: Secondary | ICD-10-CM | POA: Diagnosis not present

## 2019-01-12 LAB — IRON AND TIBC
Iron: 33 ug/dL (ref 28–170)
Saturation Ratios: 7 % — ABNORMAL LOW (ref 10.4–31.8)
TIBC: 451 ug/dL — ABNORMAL HIGH (ref 250–450)
UIBC: 418 ug/dL

## 2019-01-12 LAB — CBC
HCT: 34.5 % — ABNORMAL LOW (ref 36.0–46.0)
Hemoglobin: 10.7 g/dL — ABNORMAL LOW (ref 12.0–15.0)
MCH: 28 pg (ref 26.0–34.0)
MCHC: 31 g/dL (ref 30.0–36.0)
MCV: 90.3 fL (ref 80.0–100.0)
Platelets: 145 10*3/uL — ABNORMAL LOW (ref 150–400)
RBC: 3.82 MIL/uL — ABNORMAL LOW (ref 3.87–5.11)
RDW: 13.5 % (ref 11.5–15.5)
WBC: 2.7 10*3/uL — ABNORMAL LOW (ref 4.0–10.5)
nRBC: 0 % (ref 0.0–0.2)

## 2019-01-12 LAB — FERRITIN: Ferritin: 23 ng/mL (ref 11–307)

## 2019-01-12 MED ORDER — SODIUM CHLORIDE 0.9 % IV SOLN
Freq: Once | INTRAVENOUS | Status: AC
  Administered 2019-01-12: 15:00:00 via INTRAVENOUS
  Filled 2019-01-12: qty 250

## 2019-01-12 MED ORDER — ACETAMINOPHEN 325 MG PO TABS
ORAL_TABLET | ORAL | Status: AC
  Filled 2019-01-12: qty 2

## 2019-01-12 MED ORDER — IRON SUCROSE 20 MG/ML IV SOLN
200.0000 mg | Freq: Once | INTRAVENOUS | Status: AC
  Administered 2019-01-12: 200 mg via INTRAVENOUS
  Filled 2019-01-12: qty 10

## 2019-01-12 MED ORDER — METHYLPREDNISOLONE SODIUM SUCC 125 MG IJ SOLR
60.0000 mg | Freq: Once | INTRAMUSCULAR | Status: AC
  Administered 2019-01-12: 60 mg via INTRAVENOUS

## 2019-01-12 MED ORDER — SODIUM CHLORIDE 0.9 % IV SOLN: 200.0000 mg | Freq: Once | INTRAVENOUS | Status: DC

## 2019-01-12 MED ORDER — ACETAMINOPHEN 325 MG PO TABS
650.0000 mg | ORAL_TABLET | Freq: Once | ORAL | Status: AC
  Administered 2019-01-12: 650 mg via ORAL

## 2019-01-12 MED ORDER — METHYLPREDNISOLONE SODIUM SUCC 125 MG IJ SOLR
INTRAMUSCULAR | Status: AC
  Filled 2019-01-12: qty 2

## 2019-01-13 ENCOUNTER — Encounter: Payer: Self-pay | Admitting: Anesthesiology

## 2019-01-13 ENCOUNTER — Ambulatory Visit
Admission: RE | Admit: 2019-01-13 | Discharge: 2019-01-13 | Disposition: A | Discharge status: Home / Self Care | Payer: Medicaid Other | Attending: Gastroenterology | Admitting: Gastroenterology

## 2019-01-13 ENCOUNTER — Other Ambulatory Visit: Payer: Self-pay

## 2019-01-13 ENCOUNTER — Encounter
Admission: RE | Disposition: A | Discharge status: Home / Self Care | Payer: Self-pay | Source: Home / Self Care | Attending: Gastroenterology

## 2019-01-13 ENCOUNTER — Telehealth: Payer: Self-pay | Admitting: *Deleted

## 2019-01-13 DIAGNOSIS — F603 Borderline personality disorder: Secondary | ICD-10-CM | POA: Diagnosis not present

## 2019-01-13 DIAGNOSIS — Z9049 Acquired absence of other specified parts of digestive tract: Secondary | ICD-10-CM | POA: Insufficient documentation

## 2019-01-13 DIAGNOSIS — Z86718 Personal history of other venous thrombosis and embolism: Secondary | ICD-10-CM | POA: Diagnosis not present

## 2019-01-13 DIAGNOSIS — Z8249 Family history of ischemic heart disease and other diseases of the circulatory system: Secondary | ICD-10-CM | POA: Insufficient documentation

## 2019-01-13 DIAGNOSIS — K648 Other hemorrhoids: Secondary | ICD-10-CM | POA: Insufficient documentation

## 2019-01-13 DIAGNOSIS — I2510 Atherosclerotic heart disease of native coronary artery without angina pectoris: Secondary | ICD-10-CM | POA: Diagnosis not present

## 2019-01-13 DIAGNOSIS — F419 Anxiety disorder, unspecified: Secondary | ICD-10-CM | POA: Diagnosis not present

## 2019-01-13 DIAGNOSIS — Z9884 Bariatric surgery status: Secondary | ICD-10-CM | POA: Insufficient documentation

## 2019-01-13 DIAGNOSIS — I110 Hypertensive heart disease with heart failure: Secondary | ICD-10-CM | POA: Insufficient documentation

## 2019-01-13 DIAGNOSIS — Z7901 Long term (current) use of anticoagulants: Secondary | ICD-10-CM | POA: Insufficient documentation

## 2019-01-13 DIAGNOSIS — K644 Residual hemorrhoidal skin tags: Secondary | ICD-10-CM | POA: Insufficient documentation

## 2019-01-13 DIAGNOSIS — K219 Gastro-esophageal reflux disease without esophagitis: Secondary | ICD-10-CM | POA: Insufficient documentation

## 2019-01-13 DIAGNOSIS — J45909 Unspecified asthma, uncomplicated: Secondary | ICD-10-CM | POA: Diagnosis not present

## 2019-01-13 DIAGNOSIS — G894 Chronic pain syndrome: Secondary | ICD-10-CM | POA: Diagnosis not present

## 2019-01-13 DIAGNOSIS — Z980 Intestinal bypass and anastomosis status: Secondary | ICD-10-CM | POA: Diagnosis not present

## 2019-01-13 DIAGNOSIS — Q453 Other congenital malformations of pancreas and pancreatic duct: Secondary | ICD-10-CM

## 2019-01-13 DIAGNOSIS — Z91048 Other nonmedicinal substance allergy status: Secondary | ICD-10-CM | POA: Insufficient documentation

## 2019-01-13 DIAGNOSIS — Z86711 Personal history of pulmonary embolism: Secondary | ICD-10-CM | POA: Diagnosis not present

## 2019-01-13 DIAGNOSIS — K635 Polyp of colon: Secondary | ICD-10-CM

## 2019-01-13 DIAGNOSIS — F319 Bipolar disorder, unspecified: Secondary | ICD-10-CM | POA: Insufficient documentation

## 2019-01-13 DIAGNOSIS — Z791 Long term (current) use of non-steroidal anti-inflammatories (NSAID): Secondary | ICD-10-CM | POA: Insufficient documentation

## 2019-01-13 DIAGNOSIS — Z8673 Personal history of transient ischemic attack (TIA), and cerebral infarction without residual deficits: Secondary | ICD-10-CM | POA: Insufficient documentation

## 2019-01-13 DIAGNOSIS — R1010 Upper abdominal pain, unspecified: Secondary | ICD-10-CM

## 2019-01-13 DIAGNOSIS — K529 Noninfective gastroenteritis and colitis, unspecified: Secondary | ICD-10-CM

## 2019-01-13 DIAGNOSIS — E559 Vitamin D deficiency, unspecified: Secondary | ICD-10-CM | POA: Insufficient documentation

## 2019-01-13 DIAGNOSIS — I509 Heart failure, unspecified: Secondary | ICD-10-CM | POA: Diagnosis not present

## 2019-01-13 DIAGNOSIS — D124 Benign neoplasm of descending colon: Secondary | ICD-10-CM | POA: Diagnosis not present

## 2019-01-13 DIAGNOSIS — Z880 Allergy status to penicillin: Secondary | ICD-10-CM | POA: Insufficient documentation

## 2019-01-13 DIAGNOSIS — Z7982 Long term (current) use of aspirin: Secondary | ICD-10-CM | POA: Diagnosis not present

## 2019-01-13 DIAGNOSIS — D689 Coagulation defect, unspecified: Secondary | ICD-10-CM | POA: Diagnosis not present

## 2019-01-13 DIAGNOSIS — Z79899 Other long term (current) drug therapy: Secondary | ICD-10-CM | POA: Insufficient documentation

## 2019-01-13 DIAGNOSIS — Z832 Family history of diseases of the blood and blood-forming organs and certain disorders involving the immune mechanism: Secondary | ICD-10-CM | POA: Insufficient documentation

## 2019-01-13 DIAGNOSIS — M797 Fibromyalgia: Secondary | ICD-10-CM | POA: Diagnosis not present

## 2019-01-13 DIAGNOSIS — Z90710 Acquired absence of both cervix and uterus: Secondary | ICD-10-CM | POA: Insufficient documentation

## 2019-01-13 DIAGNOSIS — R634 Abnormal weight loss: Secondary | ICD-10-CM

## 2019-01-13 DIAGNOSIS — Z818 Family history of other mental and behavioral disorders: Secondary | ICD-10-CM | POA: Insufficient documentation

## 2019-01-13 DIAGNOSIS — D509 Iron deficiency anemia, unspecified: Secondary | ICD-10-CM | POA: Diagnosis present

## 2019-01-13 DIAGNOSIS — Z91040 Latex allergy status: Secondary | ICD-10-CM | POA: Insufficient documentation

## 2019-01-13 DIAGNOSIS — Z888 Allergy status to other drugs, medicaments and biological substances status: Secondary | ICD-10-CM | POA: Insufficient documentation

## 2019-01-13 HISTORY — PX: ESOPHAGOGASTRODUODENOSCOPY (EGD) WITH PROPOFOL: SHX5813

## 2019-01-13 HISTORY — PX: COLONOSCOPY WITH PROPOFOL: SHX5780

## 2019-01-13 SURGERY — COLONOSCOPY WITH PROPOFOL
Anesthesia: General

## 2019-01-13 MED ORDER — MIDAZOLAM HCL 2 MG/2ML IJ SOLN
INTRAMUSCULAR | Status: AC
  Filled 2019-01-13: qty 2

## 2019-01-13 MED ORDER — PROPOFOL 500 MG/50ML IV EMUL
INTRAVENOUS | Status: DC | PRN
  Administered 2019-01-13: 200 ug/kg/min via INTRAVENOUS

## 2019-01-13 MED ORDER — PROPOFOL 500 MG/50ML IV EMUL
INTRAVENOUS | Status: AC
  Filled 2019-01-13: qty 50

## 2019-01-13 MED ORDER — SODIUM CHLORIDE 0.9 % IV SOLN
INTRAVENOUS | Status: DC
  Administered 2019-01-13: 12:00:00 via INTRAVENOUS

## 2019-01-13 MED ORDER — GLYCOPYRROLATE 0.2 MG/ML IJ SOLN
INTRAMUSCULAR | Status: AC
  Filled 2019-01-13: qty 1

## 2019-01-13 MED ORDER — PROPOFOL 10 MG/ML IV BOLUS
INTRAVENOUS | Status: DC | PRN
  Administered 2019-01-13: 20 mg via INTRAVENOUS
  Administered 2019-01-13: 10 mg via INTRAVENOUS
  Administered 2019-01-13: 100 mg via INTRAVENOUS

## 2019-01-13 MED ORDER — MIDAZOLAM HCL 5 MG/5ML IJ SOLN
INTRAMUSCULAR | Status: DC | PRN
  Administered 2019-01-13: 2 mg via INTRAVENOUS

## 2019-01-13 MED ORDER — EPHEDRINE SULFATE 50 MG/ML IJ SOLN
INTRAMUSCULAR | Status: DC | PRN
  Administered 2019-01-13 (×3): 10 mg via INTRAVENOUS

## 2019-01-13 MED ORDER — LIDOCAINE HCL (PF) 2 % IJ SOLN
INTRAMUSCULAR | Status: AC
  Filled 2019-01-13: qty 10

## 2019-01-13 NOTE — Telephone Encounter (Signed)
Pt called back and said that she got my message and I went over dates starting on 12/8 1:30and she will have it once weekly for each tues and always at 1:30. 4 more doses. She is acceptable to those visits

## 2019-01-13 NOTE — Anesthesia Preprocedure Evaluation (Addendum)
Anesthesia Evaluation  Patient identified by MRN, date of birth, ID band Patient awake    Reviewed: Allergy & Precautions, NPO status , Patient's Chart, lab work & pertinent test results  Airway Mallampati: III       Dental   Pulmonary asthma ,    Pulmonary exam normal        Cardiovascular + CAD and +CHF  Normal cardiovascular exam     Neuro/Psych  Headaches, PSYCHIATRIC DISORDERS Anxiety Depression Bipolar Disorder  Neuromuscular disease CVA    GI/Hepatic Neg liver ROS, GERD  ,  Endo/Other  negative endocrine ROS  Renal/GU negative Renal ROS  negative genitourinary   Musculoskeletal  (+) Fibromyalgia -  Abdominal Normal abdominal exam  (+)   Peds negative pediatric ROS (+)  Hematology  (+) anemia ,   Anesthesia Other Findings   Reproductive/Obstetrics                             Anesthesia Physical Anesthesia Plan  ASA: III  Anesthesia Plan: General   Post-op Pain Management:    Induction: Intravenous  PONV Risk Score and Plan:   Airway Management Planned: Nasal Cannula  Additional Equipment:   Intra-op Plan:   Post-operative Plan:   Informed Consent: I have reviewed the patients History and Physical, chart, labs and discussed the procedure including the risks, benefits and alternatives for the proposed anesthesia with the patient or authorized representative who has indicated his/her understanding and acceptance.     Dental advisory given  Plan Discussed with: CRNA and Surgeon  Anesthesia Plan Comments:         Anesthesia Quick Evaluation

## 2019-01-13 NOTE — Anesthesia Procedure Notes (Signed)
Date/Time: 01/13/2019 12:09 PM Performed by: Nelda Marseille, CRNA Pre-anesthesia Checklist: Patient identified, Emergency Drugs available, Suction available, Patient being monitored and Timeout performed Oxygen Delivery Method: Nasal cannula

## 2019-01-13 NOTE — Anesthesia Post-op Follow-up Note (Signed)
Anesthesia QCDR form completed.        

## 2019-01-13 NOTE — Telephone Encounter (Signed)
-----   Message from Sindy Guadel[place], MD sent at 01/13/2019 11:08 AM EST ----- She needs 4 more doses of venofer
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
## 2019-01-13 NOTE — Op Note (Signed)
[facility]astroenterology Patient Name: Leslie Marks Procedure Date: 01/13/2019 11:31 AM MRN: [id number] Account #: [id number] Date of Birth: 02/14/1971 Admit Type: Outpatient Age: 48 Room: [facility] ENDO ROOM 1 Gender: Female Note Status: Finalized Procedure:             Upper GI endoscopy Indications:           Upper abdominal pain, Unexplained iron deficiency                         anemia, Weight loss Providers:             Lin Landsman MD, MD Medicines:             Monitored Anesthesia Care Complications:         No immediate complications. Estimated blood loss: None. Procedure:             Pre-Anesthesia Assessment:                        - Prior to the procedure, a History and Physical was                         performed, and patient medications and allergies were                         reviewed. The patient is competent. The risks and                         benefits of the procedure and the sedation options and                         risks were discussed with the patient. All questions                         were answered and informed consent was obtained.                         Patient identification and proposed procedure were                         verified by the physician, the nurse, the                         anesthesiologist, the anesthetist and the technician                         in the pre-procedure area in the procedure room in the                         endoscopy suite. Mental Status Examination: alert and                         oriented. Airway Examination: normal oropharyngeal                         airway and neck mobility. Respiratory Examination:                         clear to auscultation. CV Examination: normal.  Prophylactic Antibiotics: The patient does not require                         prophylactic antibiotics. Prior Anticoagulants: The                         patient has taken Lovenox  (enoxaparin), last dose was                         5 days prior to procedure. ASA Grade Assessment: III -                         A patient with severe systemic disease. After                         reviewing the risks and benefits, the patient was                         deemed in satisfactory condition to undergo the                         procedure. The anesthesia plan was to use monitored                         anesthesia care (MAC). Immediately prior to                         administration of medications, the patient was                         re-assessed for adequacy to receive sedatives. The                         heart rate, respiratory rate, oxygen saturations,                         blood pressure, adequacy of pulmonary ventilation, and                         response to care were monitored throughout the                         procedure. The physical status of the patient was                         re-assessed after the procedure.                        After obtaining informed consent, the endoscope was                         passed under direct vision. Throughout the procedure,                         the patient's blood pressure, pulse, and oxygen                         saturations were monitored continuously. The Endoscope  was introduced through the mouth, and advanced to the                         afferent jejunal loop. The upper GI endoscopy was                         accomplished without difficulty. The patient tolerated                         the procedure well. Findings:      Evidence of a Roux-en-Y gastrojejunostomy was found. The gastrojejunal       anastomosis was characterized by healthy appearing mucosa. This was       traversed. The pouch-to-jejunum limb was characterized by healthy       appearing mucosa. The jejunojejunal anastomosis was characterized by       healthy appearing mucosa. The duodenum-to-jejunum limb was not  examined       as it could not be reached. Biopsies from gastric pouch were taken with       a cold forceps for histology.      The examined jejunum was normal. This was biopsied with a cold forceps       for histology.      The gastroesophageal junction and examined esophagus were normal.      Two smooth, umbilicated lesions measuring 5 mm in diameter were found in       the stomach consistent with pancraetic rests. Impression:            - Roux-en-Y gastrojejunostomy with gastrojejunal                         anastomosis characterized by healthy appearing mucosa.                         Biopsied.                        - Normal examined jejunum. Biopsied.                        - Normal gastroesophageal junction and esophagus.                        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 Recommendation:        - Await pathology results.                        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 All rights reserved. The codes documented in this report are preliminary and upon coder review may  be revised to meet current compliance requirements. Dr. Ulyess Mort Lin Landsman MD, MD 01/13/2019 12:25:04 PM This report has been signed  electronically. Number of Addenda: 0 Note Initiated On: 01/13/2019 11:31 AM Estimated Blood Loss:  Estimated blood loss: none.      [facility]

## 2019-01-13 NOTE — Op Note (Signed)
[facility] Gastroenterology Patient Name: Leslie Marks Procedure Date: 01/13/2019 11:30 AM MRN: [id number] Account #: [id number] Date of Birth: February 09, 1971 Admit Type: Outpatient Age: 48 Room: [facility] ENDO ROOM 1 Gender: Female Note Status: Finalized Procedure:             Colonoscopy Indications:           Chronic diarrhea, Unexplained iron deficiency anemia,                         Weight loss Providers:             Lin Landsman MD, MD Medicines:             Monitored Anesthesia Care Complications:         No immediate complications. Estimated blood loss: None. Procedure:             Pre-Anesthesia Assessment:                        - Prior to the procedure, a History and Physical was                         performed, and patient medications and allergies were                         reviewed. The patient is competent. The risks and                         benefits of the procedure and the sedation options and                         risks were discussed with the patient. All questions                         were answered and informed consent was obtained.                         Patient identification and proposed procedure were                         verified by the physician, the nurse, the                         anesthesiologist, the anesthetist and the technician                         in the pre-procedure area in the procedure room in the                         endoscopy suite. Mental Status Examination: alert and                         oriented. Airway Examination: normal oropharyngeal                         airway and neck mobility. Respiratory Examination:                         clear to auscultation. CV Examination: normal.  Prophylactic Antibiotics: The patient does not require                         prophylactic antibiotics. Prior Anticoagulants: The                         patient has taken Lovenox (enoxaparin),  last dose was                         5 days prior to procedure. ASA Grade Assessment: III -                         A patient with severe systemic disease. After                         reviewing the risks and benefits, the patient was                         deemed in satisfactory condition to undergo the                         procedure. The anesthesia plan was to use monitored                         anesthesia care (MAC). Immediately prior to                         administration of medications, the patient was                         re-assessed for adequacy to receive sedatives. The                         heart rate, respiratory rate, oxygen saturations,                         blood pressure, adequacy of pulmonary ventilation, and                         response to care were monitored throughout the                         procedure. The physical status of the patient was                         re-assessed after the procedure.                        After obtaining informed consent, the colonoscope was                         passed under direct vision. Throughout the procedure,                         the patient's blood pressure, pulse, and oxygen                         saturations were monitored continuously. The  Colonoscope was introduced through the anus and                         advanced to the the terminal ileum, with                         identification of the appendiceal orifice and IC                         valve. The colonoscopy was performed with moderate                         difficulty due to significant looping and a tortuous                         colon. Successful completion of the procedure was                         aided by applying abdominal pressure. The patient                         tolerated the procedure well. The quality of the bowel                         preparation was fair. Findings:      The perianal and  digital rectal examinations were normal. Pertinent       negatives include normal sphincter tone and no palpable rectal lesions.      The terminal ileum appeared normal.      A 5 mm polyp was found in the descending colon. The polyp was sessile.       The polyp was removed with a cold snare. Resection and retrieval were       complete.      Normal mucosa was found in the entire colon. Biopsies for histology were       taken with a cold forceps from the entire colon for evaluation of       microscopic colitis.      Non-bleeding external and internal hemorrhoids were found during       retroflexion. The hemorrhoids were small. Impression:            - Preparation of the colon was fair.                        - The examined portion of the ileum was normal.                        - One 5 mm polyp in the descending colon, removed with                         a cold snare. Resected and retrieved.                        - Normal mucosa in the entire examined colon. Biopsied.                        - Non-bleeding external and internal hemorrhoids. Recommendation:        - Discharge patient to home (with escort).                        -  Resume previous diet today.                        - Continue present medications.                        - Resume Lovenox (enoxaparin) at prior dose today.                         Refer to managing physician for further adjustment of                         therapy.                        - Await pathology results.                        - Repeat colonoscopy in 3 years for surveillance due                         to fair prep.                        - Return to my office as previously scheduled. Procedure Code(s):     --- Professional ---                        [id number], Colonoscopy, flexible; with removal of                         tumor(s), polyp(s), or other lesion(s) by snare                         technique                        45380, 41, Colonoscopy,  flexible; with biopsy, single                         or multiple Diagnosis Code(s):     --- Professional ---                        K64.8, Other hemorrhoids                        K63.5, Polyp of colon                        K52.9, Noninfective gastroenteritis and colitis,                         unspecified                        D50.9, Iron deficiency anemia, unspecified                        R63.4, Abnormal weight loss CPT copyright 2019 American Medical Association. All rights reserved. The codes documented in this report are preliminary and upon coder review may  be revised to meet current compliance requirements. Dr. Ulyess Mort Lin Landsman MD, MD 01/13/2019 12:53:55 PM This report  has been signed electronically. Number of Addenda: 0 Note Initiated On: 01/13/2019 11:30 AM Scope Withdrawal Time: 0 hours 12 minutes 30 seconds  Total Procedure Duration: 0 hours 22 minutes 26 seconds  Estimated Blood Loss:  Estimated blood loss: none.      [facility]

## 2019-01-13 NOTE — Telephone Encounter (Signed)
Called pt and left message on voicemail that ferritin level was 23 and she will need the other 4 doses of venofer. I have sent a message to the scheduler so that appts would be made for pt. I will let her know when they have been scheduled. With the holiday here we are backed up for infusions for next week so it will probably be the week of 12/7 that she gets her next appt. But I will let her know.

## 2019-01-13 NOTE — Transfer of Care (Signed)
Immediate Anesthesia Transfer of Care Note  Patient: RHAYA BUENING  Procedure(s) Performed: COLONOSCOPY WITH PROPOFOL (N/A ) ESOPHAGOGASTRODUODENOSCOPY (EGD) WITH PROPOFOL (N/A )  Patient Location: PACU  Anesthesia Type:General  Level of Consciousness: awake, alert  and oriented  Airway & Oxygen Therapy: Patient Spontanous Breathing and Patient connected to nasal cannula oxygen  Post-op Assessment: Report given to RN and Post -op Vital signs reviewed and stable  Post vital signs: Reviewed and stable  Last Vitals:  Vitals Value Taken Time  BP 115/70 01/13/19 1254  Temp 36 C 01/13/19 1253  Pulse 95 01/13/19 1259  Resp 17 01/13/19 1259  SpO2 100 % 01/13/19 1259  Vitals shown include unvalidated device data.  Last Pain:  Vitals:   01/13/19 1253  TempSrc: Temporal  PainSc:          Complications: No apparent anesthesia complications

## 2019-01-15 LAB — SURGICAL PATHOLOGY

## 2019-01-18 ENCOUNTER — Telehealth: Payer: Self-pay | Admitting: Gastroenterology

## 2019-01-18 ENCOUNTER — Encounter: Payer: Self-pay | Admitting: Gastroenterology
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
## 2019-01-18 NOTE — Anesthesia Postprocedure Evaluation (Signed)
Anesthesia Post Note  Patient: IRLENE SODERQUIST  Procedure(s) Performed: COLONOSCOPY WITH PROPOFOL (N/A ) ESOPHAGOGASTRODUODENOSCOPY (EGD) WITH PROPOFOL (N/A )  Anesthesia Type: General Level of consciousness: awake and alert and oriented Pain management: pain level controlled Vital Signs Assessment: post-procedure vital signs reviewed and stable Respiratory status: spontaneous breathing Cardiovascular status: blood pressure returned to baseline Anesthetic complications: no     Last Vitals:  Vitals:   01/13/19 1313 01/13/19 1320  BP: 119/74 122/80  Pulse: 88 86  Resp: 18 12  Temp:    SpO2: 100% 100%    Last Pain:  Vitals:   01/15/19 0941  TempSrc:   PainSc: 0-No pain                 Omeed Osuna

## 2019-01-18 NOTE — Telephone Encounter (Signed)
Patient called & needs the referral notes to DR Belenda Cruise Crisp([facility]) refaxed to this fax # [phone]. She states this is the correct fax # we had sent to then incorrect one.

## 2019-01-19 ENCOUNTER — Encounter: Payer: Self-pay | Admitting: Gastroenterology

## 2019-01-19 NOTE — Telephone Encounter (Signed)
Yes, please refer her to Dr. Mohammed Kindle to the fax number that she mentioned. Dx: Chronic pain management for chronic abdominal pain, on long-term opioid use  Thanks RV

## 2019-01-19 NOTE — Telephone Encounter (Signed)
I do not see that we referred patient to the pain clinic. Were we supposed to refer patient

## 2019-01-19 NOTE — Telephone Encounter (Signed)
Faxed referral

## 2019-01-25 ENCOUNTER — Telehealth: Payer: Self-pay | Admitting: Gastroenterology

## 2019-01-25 NOTE — Telephone Encounter (Signed)
Pt  Left vm to obtain her results from her procedure she states they took a sample of her pancrease and wants those results

## 2019-01-25 NOTE — Telephone Encounter (Signed)
Patient made appointment on 01/26/2019

## 2019-01-25 NOTE — Telephone Encounter (Signed)
Leslie Marks  I do not know for sure if she has pancreatic rests. I have asked her to try pancreatic enzymes given her history of gastric bypass and GI symptoms.  Please set up a virtual visit or office visit to see me tomorrow or Wednesday to discuss further steps.  Okay to [employer]

## 2019-01-26 ENCOUNTER — Encounter: Payer: Self-pay | Admitting: Gastroenterology

## 2019-01-26 ENCOUNTER — Other Ambulatory Visit: Payer: Self-pay

## 2019-01-26 ENCOUNTER — Inpatient Hospital Stay: Payer: Medicaid Other | Attending: Oncology

## 2019-01-26 ENCOUNTER — Ambulatory Visit: Payer: Medicaid Other | Admitting: Gastroenterology

## 2019-01-26 ENCOUNTER — Ambulatory Visit (INDEPENDENT_AMBULATORY_CARE_PROVIDER_SITE_OTHER): Payer: Medicaid Other | Admitting: Gastroenterology

## 2019-01-26 VITALS — BP 109/65 | HR 99 | Temp 97.7°F | Ht 65.5 in | Wt 163.8 lb

## 2019-01-26 DIAGNOSIS — K529 Noninfective gastroenteritis and colitis, unspecified: Secondary | ICD-10-CM

## 2019-01-26 DIAGNOSIS — Z9884 Bariatric surgery status: Secondary | ICD-10-CM

## 2019-01-26 DIAGNOSIS — D508 Other iron deficiency anemias: Secondary | ICD-10-CM

## 2019-01-26 MED ORDER — PANCRELIPASE (LIP-PROT-AMYL) 36000-114000 UNITS PO CPEP: ORAL_CAPSULE | ORAL | 3 refills | Status: DC

## 2019-01-26 MED ORDER — DICYCLOMINE HCL 20 MG PO TABS: 20.0000 mg | ORAL_TABLET | Freq: Three times a day (TID) | ORAL | 3 refills | Status: DC
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
## 2019-01-26 NOTE — Patient Instructions (Signed)
Creon medication:   Take 2 with each meal and 1 with a snack daily  We have also sent you a prescription for Creon to your pharmacy.  Please increase your dose of Dicyclomine (Bentyl) to 20 mg daily.

## 2019-01-31 ENCOUNTER — Emergency Department: Payer: Medicaid Other

## 2019-01-31 ENCOUNTER — Other Ambulatory Visit: Payer: Self-pay

## 2019-01-31 ENCOUNTER — Inpatient Hospital Stay: Payer: Medicaid Other

## 2019-01-31 ENCOUNTER — Inpatient Hospital Stay
Admission: EM | Admit: 2019-01-31 | Discharge: 2019-02-01 | DRG: 563 | Discharge status: Against Medical Advice | Payer: Medicaid Other | Attending: Internal Medicine | Admitting: Internal Medicine

## 2019-01-31 ENCOUNTER — Encounter: Payer: Self-pay | Admitting: Radiology

## 2019-01-31 DIAGNOSIS — Z833 Family history of diabetes mellitus: Secondary | ICD-10-CM | POA: Diagnosis not present

## 2019-01-31 DIAGNOSIS — Z86718 Personal history of other venous thrombosis and embolism: Secondary | ICD-10-CM

## 2019-01-31 DIAGNOSIS — K8689 Other specified diseases of pancreas: Secondary | ICD-10-CM | POA: Diagnosis present

## 2019-01-31 DIAGNOSIS — Y9301 Activity, walking, marching and hiking: Secondary | ICD-10-CM | POA: Diagnosis present

## 2019-01-31 DIAGNOSIS — F1120 Opioid dependence, uncomplicated: Secondary | ICD-10-CM | POA: Diagnosis present

## 2019-01-31 DIAGNOSIS — Z7982 Long term (current) use of aspirin: Secondary | ICD-10-CM

## 2019-01-31 DIAGNOSIS — Z888 Allergy status to other drugs, medicaments and biological substances status: Secondary | ICD-10-CM | POA: Diagnosis not present

## 2019-01-31 DIAGNOSIS — Z20828 Contact with and (suspected) exposure to other viral communicable diseases: Secondary | ICD-10-CM | POA: Diagnosis present

## 2019-01-31 DIAGNOSIS — Z7901 Long term (current) use of anticoagulants: Secondary | ICD-10-CM

## 2019-01-31 DIAGNOSIS — W01198A Fall on same level from slipping, tripping and stumbling with subsequent striking against other object, initial encounter: Secondary | ICD-10-CM | POA: Diagnosis present

## 2019-01-31 DIAGNOSIS — E162 Hypoglycemia, unspecified: Secondary | ICD-10-CM | POA: Diagnosis present

## 2019-01-31 DIAGNOSIS — E538 Deficiency of other specified B group vitamins: Secondary | ICD-10-CM | POA: Diagnosis present

## 2019-01-31 DIAGNOSIS — Y92002 Bathroom of unspecified non-institutional (private) residence single-family (private) house as the place of occurrence of the external cause: Secondary | ICD-10-CM | POA: Diagnosis not present

## 2019-01-31 DIAGNOSIS — Z91040 Latex allergy status: Secondary | ICD-10-CM

## 2019-01-31 DIAGNOSIS — S82851A Displaced trimalleolar fracture of right lower leg, initial encounter for closed fracture: Secondary | ICD-10-CM | POA: Diagnosis present

## 2019-01-31 DIAGNOSIS — J45909 Unspecified asthma, uncomplicated: Secondary | ICD-10-CM | POA: Diagnosis present

## 2019-01-31 DIAGNOSIS — Z881 Allergy status to other antibiotic agents status: Secondary | ICD-10-CM

## 2019-01-31 DIAGNOSIS — Z95828 Presence of other vascular implants and grafts: Secondary | ICD-10-CM

## 2019-01-31 DIAGNOSIS — K219 Gastro-esophageal reflux disease without esophagitis: Secondary | ICD-10-CM | POA: Diagnosis present

## 2019-01-31 DIAGNOSIS — Z832 Family history of diseases of the blood and blood-forming organs and certain disorders involving the immune mechanism: Secondary | ICD-10-CM

## 2019-01-31 DIAGNOSIS — I252 Old myocardial infarction: Secondary | ICD-10-CM | POA: Diagnosis not present

## 2019-01-31 DIAGNOSIS — K9589 Other complications of other bariatric procedure: Secondary | ICD-10-CM | POA: Diagnosis present

## 2019-01-31 DIAGNOSIS — Y832 Surgical operation with anastomosis, bypass or graft as the cause of abnormal reaction of the patient, or of later complication, without mention of misadventure at the time of the procedure: Secondary | ICD-10-CM | POA: Diagnosis present

## 2019-01-31 DIAGNOSIS — I69351 Hemiplegia and hemiparesis following cerebral infarction affecting right dominant side: Secondary | ICD-10-CM

## 2019-01-31 DIAGNOSIS — Z8249 Family history of ischemic heart disease and other diseases of the circulatory system: Secondary | ICD-10-CM | POA: Diagnosis not present

## 2019-01-31 DIAGNOSIS — G8929 Other chronic pain: Secondary | ICD-10-CM | POA: Diagnosis present

## 2019-01-31 DIAGNOSIS — Z01818 Encounter for other preprocedural examination: Secondary | ICD-10-CM

## 2019-01-31 DIAGNOSIS — T1490XA Injury, unspecified, initial encounter: Secondary | ICD-10-CM

## 2019-01-31 DIAGNOSIS — Z880 Allergy status to penicillin: Secondary | ICD-10-CM

## 2019-01-31 DIAGNOSIS — T8141XA Infection following a procedure, superficial incisional surgical site, initial encounter: Secondary | ICD-10-CM | POA: Diagnosis present

## 2019-01-31 DIAGNOSIS — Z79891 Long term (current) use of opiate analgesic: Secondary | ICD-10-CM | POA: Diagnosis not present

## 2019-01-31 DIAGNOSIS — Z86711 Personal history of pulmonary embolism: Secondary | ICD-10-CM

## 2019-01-31 DIAGNOSIS — Z5329 Procedure and treatment not carried out because of patient's decision for other reasons: Secondary | ICD-10-CM | POA: Diagnosis not present

## 2019-01-31 DIAGNOSIS — F329 Major depressive disorder, single episode, unspecified: Secondary | ICD-10-CM | POA: Diagnosis present

## 2019-01-31 DIAGNOSIS — S31109A Unspecified open wound of abdominal wall, unspecified quadrant without penetration into peritoneal cavity, initial encounter: Secondary | ICD-10-CM

## 2019-01-31 DIAGNOSIS — Z8673 Personal history of transient ischemic attack (TIA), and cerebral infarction without residual deficits: Secondary | ICD-10-CM

## 2019-01-31 DIAGNOSIS — I2510 Atherosclerotic heart disease of native coronary artery without angina pectoris: Secondary | ICD-10-CM | POA: Diagnosis present

## 2019-01-31 DIAGNOSIS — K9189 Other postprocedural complications and disorders of digestive system: Secondary | ICD-10-CM

## 2019-01-31 DIAGNOSIS — L089 Local infection of the skin and subcutaneous tissue, unspecified: Secondary | ICD-10-CM

## 2019-01-31 DIAGNOSIS — Z825 Family history of asthma and other chronic lower respiratory diseases: Secondary | ICD-10-CM

## 2019-01-31 DIAGNOSIS — R55 Syncope and collapse: Secondary | ICD-10-CM

## 2019-01-31 DIAGNOSIS — Z79899 Other long term (current) drug therapy: Secondary | ICD-10-CM

## 2019-01-31 DIAGNOSIS — K529 Noninfective gastroenteritis and colitis, unspecified: Secondary | ICD-10-CM | POA: Diagnosis present

## 2019-01-31 DIAGNOSIS — W19XXXA Unspecified fall, initial encounter: Principal | ICD-10-CM

## 2019-01-31 LAB — URINALYSIS, COMPLETE (UACMP) WITH MICROSCOPIC
Bacteria, UA: NONE SEEN
Bilirubin Urine: NEGATIVE
Glucose, UA: NEGATIVE mg/dL
Hgb urine dipstick: NEGATIVE
Ketones, ur: NEGATIVE mg/dL
Leukocytes,Ua: NEGATIVE
Nitrite: NEGATIVE
Protein, ur: NEGATIVE mg/dL
Specific Gravity, Urine: 1.01 (ref 1.005–1.030)
pH: 6 (ref 5.0–8.0)

## 2019-01-31 LAB — CBC WITH DIFFERENTIAL/PLATELET
Abs Immature Granulocytes: 0.01 10*3/uL (ref 0.00–0.07)
Basophils Absolute: 0 10*3/uL (ref 0.0–0.1)
Basophils Relative: 1 %
Eosinophils Absolute: 0.1 10*3/uL (ref 0.0–0.5)
Eosinophils Relative: 2 %
HCT: 37.1 % (ref 36.0–46.0)
Hemoglobin: 11.5 g/dL — ABNORMAL LOW (ref 12.0–15.0)
Immature Granulocytes: 0 %
Lymphocytes Relative: 33 %
Lymphs Abs: 1 10*3/uL (ref 0.7–4.0)
MCH: 28.8 pg (ref 26.0–34.0)
MCHC: 31 g/dL (ref 30.0–36.0)
MCV: 92.8 fL (ref 80.0–100.0)
Monocytes Absolute: 0.2 10*3/uL (ref 0.1–1.0)
Monocytes Relative: 8 %
Neutro Abs: 1.6 10*3/uL — ABNORMAL LOW (ref 1.7–7.7)
Neutrophils Relative %: 56 %
Platelets: 125 10*3/uL — ABNORMAL LOW (ref 150–400)
RBC: 4 MIL/uL (ref 3.87–5.11)
RDW: 15.1 % (ref 11.5–15.5)
WBC: 2.9 10*3/uL — ABNORMAL LOW (ref 4.0–10.5)
nRBC: 0 % (ref 0.0–0.2)

## 2019-01-31 LAB — COMPREHENSIVE METABOLIC PANEL
ALT: 57 U/L — ABNORMAL HIGH (ref 0–44)
AST: 194 U/L — ABNORMAL HIGH (ref 15–41)
Albumin: 4 g/dL (ref 3.5–5.0)
Alkaline Phosphatase: 213 U/L — ABNORMAL HIGH (ref 38–126)
Anion gap: 10 (ref 5–15)
BUN: 15 mg/dL (ref 6–20)
CO2: 24 mmol/L (ref 22–32)
Calcium: 9.6 mg/dL (ref 8.9–10.3)
Chloride: 103 mmol/L (ref 98–111)
Creatinine, Ser: 0.73 mg/dL (ref 0.44–1.00)
GFR calc Af Amer: >60 mL/min (ref >60)
GFR calc non Af Amer: >60 mL/min (ref >60)
Glucose, Bld: 90 mg/dL (ref 70–99)
Potassium: 5 mmol/L (ref 3.5–5.1)
Sodium: 137 mmol/L (ref 135–145)
Total Bilirubin: 1.2 mg/dL (ref 0.3–1.2)
Total Protein: 7 g/dL (ref 6.5–8.1)

## 2019-01-31 MED ORDER — ACETAMINOPHEN 650 MG RE SUPP: 650.0000 mg | Freq: Four times a day (QID) | RECTAL | Status: DC | PRN

## 2019-01-31 MED ORDER — OXYCODONE HCL 5 MG PO TABS
5.0000 mg | ORAL_TABLET | ORAL | Status: DC | PRN
  Administered 2019-01-31: 20:00:00 5 mg via ORAL
  Filled 2019-01-31: qty 1

## 2019-01-31 MED ORDER — IOHEXOL 300 MG/ML  SOLN
100.0000 mL | Freq: Once | INTRAMUSCULAR | Status: AC | PRN
  Administered 2019-01-31: 18:00:00 100 mL via INTRAVENOUS

## 2019-01-31 MED ORDER — KETOROLAC TROMETHAMINE 30 MG/ML IJ SOLN
INTRAMUSCULAR | Status: AC
  Filled 2019-01-31: qty 1

## 2019-01-31 MED ORDER — ONDANSETRON HCL 4 MG/2ML IJ SOLN
4.0000 mg | Freq: Once | INTRAMUSCULAR | Status: AC
  Administered 2019-01-31: 4 mg via INTRAVENOUS

## 2019-01-31 MED ORDER — HYDROMORPHONE HCL 1 MG/ML IJ SOLN
INTRAMUSCULAR | Status: AC
  Administered 2019-01-31: 15:00:00 0.5 mg via INTRAVENOUS
  Filled 2019-01-31: qty 1

## 2019-01-31 MED ORDER — ACETAMINOPHEN 325 MG PO TABS: 650.0000 mg | ORAL_TABLET | Freq: Four times a day (QID) | ORAL | Status: DC | PRN

## 2019-01-31 MED ORDER — HYDROMORPHONE HCL 1 MG/ML IJ SOLN: 0.5000 mg | Freq: Once | INTRAMUSCULAR | Status: AC

## 2019-01-31 MED ORDER — HYDROMORPHONE HCL 1 MG/ML IJ SOLN
0.5000 mg | Freq: Once | INTRAMUSCULAR | Status: AC
  Administered 2019-01-31: 19:00:00 0.5 mg via INTRAVENOUS
  Filled 2019-01-31: qty 1

## 2019-01-31 MED ORDER — MORPHINE SULFATE (PF) 4 MG/ML IV SOLN
INTRAVENOUS | Status: AC
  Filled 2019-01-31: qty 1

## 2019-01-31 MED ORDER — ONDANSETRON HCL 4 MG/2ML IJ SOLN
INTRAMUSCULAR | Status: AC
  Filled 2019-01-31: qty 2

## 2019-01-31 MED ORDER — HEPARIN (PORCINE) 25000 UT/250ML-% IV SOLN
1000.0000 [IU]/h | INTRAVENOUS | Status: DC
  Filled 2019-01-31: qty 250

## 2019-01-31 MED ORDER — SENNOSIDES-DOCUSATE SODIUM 8.6-50 MG PO TABS
1.0000 | ORAL_TABLET | Freq: Every evening | ORAL | Status: DC | PRN
  Filled 2019-01-31: qty 1

## 2019-01-31 MED ORDER — DEXTROSE-NACL 5-0.9 % IV SOLN: INTRAVENOUS | Status: DC

## 2019-01-31 MED ORDER — KETOROLAC TROMETHAMINE 15 MG/ML IJ SOLN
15.0000 mg | Freq: Four times a day (QID) | INTRAMUSCULAR | Status: DC | PRN
  Filled 2019-01-31: qty 1

## 2019-01-31 MED ORDER — MORPHINE SULFATE (PF) 4 MG/ML IV SOLN
4.0000 mg | Freq: Once | INTRAVENOUS | Status: AC
  Administered 2019-01-31: 4 mg via INTRAVENOUS

## 2019-01-31 NOTE — ED Provider Notes (Signed)
-----------------------------------------   3:45 PM on 01/31/2019 -----------------------------------------  Height 5\' 5"  (1.651 m), weight 74 kg.  Assuming care from Dr. Cinda Quest.  In short, Leslie Marks is a 48 y.o. female with a chief complaint of Fall, Loss of Consciousness, and Ankle Injury .  Refer to the original H&P for additional details.  The current plan of care is to follow-up podiatry recommendations and additional imaging.  Remainder of CTs were performed, but interpretation limited due to contrast extravasation.  Patient with minimal symptoms related to extravasation at this time and remains neurovascularly intact to her right upper extremity.  She complains of some muscle cramping to her abdomen but overall does not have significant pain and I doubt our noncontrasted CT scans have missed an acute traumatic pathology.  Splint was placed to her right ankle and case discussed with Dr. Cleda Mccreedy of podiatry, who states that Dr. Luana Shu will be available later this week for operative repair.  Patient seems to be a poor candidate for discharge home with crutches given she has a prior stroke on the right side and will require admission.  Case discussed with hospitalist, who accepts patient for admission.    Blake Divine, MD 01/31/19 2011

## 2019-01-31 NOTE — ED Notes (Signed)
Pt requesting more pain medication at this time, informed Dr. Charna Archer, per Dr. Charna Archer, hold off on any more pain medications for now until after scans are completed.

## 2019-01-31 NOTE — ED Notes (Signed)
Pt taken to ct 

## 2019-01-31 NOTE — ED Notes (Signed)
Pt refusing to Ice pack to left upper arm where IV contrast infiltrated.

## 2019-01-31 NOTE — H&P (Addendum)
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 Signed   By: Inez Catalina M.D.   On: 01/31/2019 21:21   DG Chest Portable 1 View  Result Date: 01/31/2019 CLINICAL DATA:  Syncope. EXAM: PORTABLE CHEST 1 VIEW COMPARISON:  None. FINDINGS: The heart size  and mediastinal contours are within normal limits. Both lungs are clear. The visualized skeletal structures are unremarkable. IMPRESSION: Normal exam. Electronically Signed   By: Lorriane Shire M.D.   On: 01/31/2019 15:04     Assessment/Plan    Trimalleolar fracture, right, closed, initial encounter --- Secondary to fall/syncopal event while taking a shower --- Splint placed in the emergency room ---Discussed with podiatrist, Dr. Luana Shu who will take patient to the OR on Tuesday    Chronic anticoagulation secondary to history  of PE and recurrent DVT --- Patient is on full dose Lovenox since 2003 which she states she has not taken in 3 days --- IV bridging heparin until procedure with close monitoring for bleeding from site of fracture. --- Patient does have IVC filter  Syncope and collapse --- Suspect secondary to hypoglycemia, polypharmacy of narcotics and multiple psychotropics. ---  Follow Urine drug screen --- Patient states her blood sugar was 42 prior to going into the shower.  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  She reports a history of MI.  No history of congestive heart failure, no prior history of syncopal episodes prior to today.  Denies a history of COPD, obstructive sleep apnea.  He is on chronic methadone --- Syncopal event of uncertain etiology, will get echocardiogram to assist in preoperative evaluation.  Otherwise patient has no absolute contraindication to proposed procedure --- Long-term Lovenox therapy treatment of recurrent DVT will bridge with heparin pending surgical procedure    Hemiparesis affecting right side as late effect of cerebrovascular accident (CVA) ([place]) ----Patient with history  of CVA intraoperatively having gastric bypass with residual right-sided hemiparesis, ambulates with a cane    Chronic surgical wound infection of abdomen --- Patient has a chronic wound with purulent oozing that is followed by her doctors at [facility].    Complications of gastric bypass surgery --- She reports multiple complications of her gastric bypass surgery including problems mentioned above, chronic hypoglycemia, CVA, chronic wound infection, chronic diarrhea  Coronary artery disease ---seen on care everywhere problem list. --- She denies chest pain ---Last echo 10/2017 with EF 55%. No stress test seen   Athena Masse MD [facility]   If 7PM-7AM, please contact night-coverage [email or web address] Password [facility]  01/31/2019, 10:55 PM

## 2019-01-31 NOTE — ED Notes (Signed)
Pt reported pain, per orders pt was offered toradol. Pt declined toradol immediately.

## 2019-01-31 NOTE — ED Notes (Signed)
Pt assisted to use bed pan.

## 2019-01-31 NOTE — ED Notes (Signed)
PiIlow given per pt request. TV on and lights dimmed. Pt in NAD, call bell in reach.

## 2019-01-31 NOTE — ED Notes (Addendum)
Pt placed on bed pan. Pt able to lift hips up by self and requesting pain medications.

## 2019-01-31 NOTE — ED Notes (Signed)
Pt assisted with bedpan for watery stool.

## 2019-01-31 NOTE — ED Triage Notes (Signed)
Pt arrived via EMS from home with reports of LOC in bathroom while in bathroom. Pt states she felt dizzy previous to going into the bathroom, pt states she passed out and hit her head on the tub.  Pt states she was in [facility] on Tuesday recently.  Pt reports she heard her ankle pop and crack.  Per EMS they weren't initially able to palpate a pulse in right ankle.  On arrival pulse to right ankle was palpable with doppler.

## 2019-01-31 NOTE — ED Notes (Signed)
Pt assisted with bedpan for urination, declines offer for purewick.
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
## 2019-02-01 LAB — URINE DRUG SCREEN, QUALITATIVE (ARMC ONLY)
Amphetamines, Ur Screen: NOT DETECTED
Barbiturates, Ur Screen: NOT DETECTED
Benzodiazepine, Ur Scrn: NOT DETECTED
Cannabinoid 50 Ng, Ur ~~LOC~~: NOT DETECTED
Cocaine Metabolite,Ur ~~LOC~~: NOT DETECTED
MDMA (Ecstasy)Ur Screen: NOT DETECTED
Methadone Scn, Ur: NOT DETECTED
Opiate, Ur Screen: POSITIVE — AB
Phencyclidine (PCP) Ur S: NOT DETECTED
Tricyclic, Ur Screen: NOT DETECTED

## 2019-02-01 LAB — SARS CORONAVIRUS 2 (TAT 6-24 HRS): SARS Coronavirus 2: NEGATIVE

## 2019-02-01 MED ORDER — LURASIDONE HCL 40 MG PO TABS: 40.0000 mg | ORAL_TABLET | Freq: Every day | ORAL | Status: DC

## 2019-02-01 MED ORDER — PANCRELIPASE (LIP-PROT-AMYL) 12000-38000 UNITS PO CPEP: 72000.0000 [IU] | ORAL_CAPSULE | Freq: Three times a day (TID) | ORAL | Status: DC

## 2019-02-01 MED ORDER — TRAZODONE HCL 50 MG PO TABS: 300.0000 mg | ORAL_TABLET | Freq: Every day | ORAL | Status: DC

## 2019-02-01 MED ORDER — PROPRANOLOL HCL 40 MG PO TABS: 40.0000 mg | ORAL_TABLET | Freq: Three times a day (TID) | ORAL | Status: DC

## 2019-02-01 MED ORDER — GABAPENTIN 400 MG PO CAPS: 800.0000 mg | ORAL_CAPSULE | Freq: Three times a day (TID) | ORAL | Status: DC

## 2019-02-01 MED ORDER — DICYCLOMINE HCL 20 MG PO TABS: 40.0000 mg | ORAL_TABLET | Freq: Three times a day (TID) | ORAL | Status: DC

## 2019-02-01 MED ORDER — HYDROXYZINE HCL 25 MG PO TABS: 50.0000 mg | ORAL_TABLET | Freq: Three times a day (TID) | ORAL | Status: DC | PRN

## 2019-02-01 MED ORDER — OXYCODONE HCL 10 MG PO TABS: 10.0000 mg | ORAL_TABLET | ORAL | Status: DC | PRN

## 2019-02-01 MED ORDER — BUPROPION HCL 100 MG PO TABS: 100.0000 mg | ORAL_TABLET | Freq: Every day | ORAL | Status: DC

## 2019-02-01 MED ORDER — PANCRELIPASE (LIP-PROT-AMYL) 12000-38000 UNITS PO CPEP: 36000.0000 [IU] | ORAL_CAPSULE | ORAL | Status: DC

## 2019-02-01 NOTE — ED Notes (Signed)
Daughter at the facility. IV d/c. Pt being assisted with dressing by cna.

## 2019-02-01 NOTE — ED Notes (Signed)
Cms intact to right foot.

## 2019-02-01 NOTE — ED Notes (Signed)
Pt provided with walker and proper instructions given to pt on walker use. Pt states she is leaving because "i'm going to my pain doctor tomorrow and then i'm going to [facility], I don't trust that doctor who saw me". Pt continues to state she would prefer the services of [facility]. Pt encouraged to stay, informed pt that hospitalist has now ordered her home medications, but pt continues to decline to stay. Pt instructed on the P's of compartment syndrome and worsening cardiovascular status of foot. Pt verbalizes understanding to seek emergency care for same.

## 2019-02-01 NOTE — Discharge Summary (Addendum)
  Patient signed out AGAINST MEDICAL ADVICE  A few hours after admission while still in the emergency room, patient was upset that her home medications had not been administered her as yet. Her medications were subsequently reconciled and prescribed but she insisted on signing out [place].  Patient was reassessed and we spoke to her intensively about the dangers of signing out [place] given her need for surgery.Risks discussed include but not limited to recurrent syncope, recurrent fall with life threatening injury given her chronic anticoagulation, poor healing and othere related complications of her untreated fracture.  In spite of our strong encouragement to stay for further treatment, she proceeded to sign out AMA.  Please see H&P dictated within the last few hours for detail of the stay

## 2019-02-01 NOTE — ED Notes (Signed)
Pt states she wants to leave the hospital. Pt states she will come back on Tuesday or go to [facility]. Pt states "I want all my medicine right now." pt informed RN that she has called her daughter to come pick her up. Pt informed that this RN has been attempting to speak with hospitalist regarding her home medications for two hours, but no return page has been made by hospitalist. Pt informed that this RN must have an order for home medication to administer. Pt verbalizes understanding. Another page placed to hospitalist service at this time.

## 2019-02-01 NOTE — ED Notes (Signed)
Pt informed that she has an admission bed, pt states she does not want the bed because she will leave when her daughter arrives.

## 2019-02-01 NOTE — ED Notes (Signed)
Rufina Falco, np aware pt has left ama.

## 2019-02-02 ENCOUNTER — Inpatient Hospital Stay: Payer: Medicaid Other

## 2019-02-08 ENCOUNTER — Telehealth: Payer: Self-pay | Admitting: Oncology

## 2019-02-08 NOTE — Telephone Encounter (Signed)
Phoned patient with appt reminder. Patient stated that she was currently admitted to [facility] due to a broken foot/leg and also noted that her sugar had dropped. Patient stated that she was unsure when she would be able to attend appt. Appt for 02-09-19 was cancelled and oncology team was informed.

## 2019-02-09 ENCOUNTER — Inpatient Hospital Stay: Payer: Medicaid Other

## 2019-02-15 ENCOUNTER — Telehealth: Payer: Self-pay

## 2019-02-15 NOTE — Telephone Encounter (Signed)
Called patient and had to leave her a voicemail letting her know that [facility] dermatology had been trying to get in contact with her and they have not. I left her their phone number so she could call them back and schedule an appointment since she requested the referral. [facility] dermatology sent her a letter as well to contact them and schedule an appointment.

## 2019-02-16 ENCOUNTER — Ambulatory Visit: Payer: Medicaid Other

## 2019-02-16 ENCOUNTER — Telehealth: Payer: Self-pay | Admitting: Oncology

## 2019-02-16 NOTE — Telephone Encounter (Signed)
Writer received message stating that patient wanted to cancel her infusion appt on 02-17-19. Writer attempted to phone patient and reschedule but could not reach patient or leave a message.

## 2019-02-17 ENCOUNTER — Inpatient Hospital Stay: Payer: Medicaid Other

## 2019-02-22 ENCOUNTER — Other Ambulatory Visit: Payer: Self-pay

## 2019-02-22 ENCOUNTER — Ambulatory Visit: Payer: Medicaid Other | Admitting: Gastroenterology

## 2019-02-23 ENCOUNTER — Inpatient Hospital Stay: Payer: Medicaid Other | Attending: Oncology

## 2019-02-25 ENCOUNTER — Inpatient Hospital Stay: Payer: Medicaid Other

## 2019-05-04 ENCOUNTER — Other Ambulatory Visit: Payer: Self-pay | Admitting: Gastroenterology

## 2019-05-10 ENCOUNTER — Other Ambulatory Visit: Payer: Self-pay | Admitting: Gastroenterology

## 2019-06-06 ENCOUNTER — Inpatient Hospital Stay
Admission: EM | Admit: 2019-06-06 | Discharge: 2019-06-14 | DRG: 312 | Disposition: A | Discharge status: Home / Self Care | Payer: Medicaid Other | Attending: Internal Medicine | Admitting: Internal Medicine

## 2019-06-06 ENCOUNTER — Emergency Department: Payer: Medicaid Other

## 2019-06-06 ENCOUNTER — Other Ambulatory Visit: Payer: Self-pay

## 2019-06-06 DIAGNOSIS — F603 Borderline personality disorder: Secondary | ICD-10-CM | POA: Diagnosis present

## 2019-06-06 DIAGNOSIS — F329 Major depressive disorder, single episode, unspecified: Secondary | ICD-10-CM

## 2019-06-06 DIAGNOSIS — Z90710 Acquired absence of both cervix and uterus: Secondary | ICD-10-CM

## 2019-06-06 DIAGNOSIS — K219 Gastro-esophageal reflux disease without esophagitis: Secondary | ICD-10-CM | POA: Diagnosis present

## 2019-06-06 DIAGNOSIS — Z634 Disappearance and death of family member: Secondary | ICD-10-CM

## 2019-06-06 DIAGNOSIS — Z7982 Long term (current) use of aspirin: Secondary | ICD-10-CM

## 2019-06-06 DIAGNOSIS — Z888 Allergy status to other drugs, medicaments and biological substances status: Secondary | ICD-10-CM

## 2019-06-06 DIAGNOSIS — Z9049 Acquired absence of other specified parts of digestive tract: Secondary | ICD-10-CM

## 2019-06-06 DIAGNOSIS — F1120 Opioid dependence, uncomplicated: Secondary | ICD-10-CM | POA: Diagnosis present

## 2019-06-06 DIAGNOSIS — R140 Abdominal distension (gaseous): Secondary | ICD-10-CM

## 2019-06-06 DIAGNOSIS — Z825 Family history of asthma and other chronic lower respiratory diseases: Secondary | ICD-10-CM

## 2019-06-06 DIAGNOSIS — M797 Fibromyalgia: Secondary | ICD-10-CM | POA: Diagnosis present

## 2019-06-06 DIAGNOSIS — I959 Hypotension, unspecified: Principal | ICD-10-CM

## 2019-06-06 DIAGNOSIS — F419 Anxiety disorder, unspecified: Secondary | ICD-10-CM | POA: Diagnosis present

## 2019-06-06 DIAGNOSIS — K5900 Constipation, unspecified: Secondary | ICD-10-CM | POA: Diagnosis present

## 2019-06-06 DIAGNOSIS — J4520 Mild intermittent asthma, uncomplicated: Secondary | ICD-10-CM | POA: Diagnosis present

## 2019-06-06 DIAGNOSIS — K9189 Other postprocedural complications and disorders of digestive system: Secondary | ICD-10-CM | POA: Diagnosis present

## 2019-06-06 DIAGNOSIS — Z8249 Family history of ischemic heart disease and other diseases of the circulatory system: Secondary | ICD-10-CM

## 2019-06-06 DIAGNOSIS — Y832 Surgical operation with anastomosis, bypass or graft as the cause of abnormal reaction of the patient, or of later complication, without mention of misadventure at the time of the procedure: Secondary | ICD-10-CM | POA: Diagnosis present

## 2019-06-06 DIAGNOSIS — R112 Nausea with vomiting, unspecified: Secondary | ICD-10-CM

## 2019-06-06 DIAGNOSIS — Z880 Allergy status to penicillin: Secondary | ICD-10-CM

## 2019-06-06 DIAGNOSIS — K632 Fistula of intestine: Secondary | ICD-10-CM | POA: Diagnosis present

## 2019-06-06 DIAGNOSIS — I951 Orthostatic hypotension: Principal | ICD-10-CM | POA: Diagnosis present

## 2019-06-06 DIAGNOSIS — K912 Postsurgical malabsorption, not elsewhere classified: Secondary | ICD-10-CM | POA: Diagnosis present

## 2019-06-06 DIAGNOSIS — D696 Thrombocytopenia, unspecified: Secondary | ICD-10-CM | POA: Diagnosis not present

## 2019-06-06 DIAGNOSIS — Z833 Family history of diabetes mellitus: Secondary | ICD-10-CM

## 2019-06-06 DIAGNOSIS — Z832 Family history of diseases of the blood and blood-forming organs and certain disorders involving the immune mechanism: Secondary | ICD-10-CM

## 2019-06-06 DIAGNOSIS — K909 Intestinal malabsorption, unspecified: Secondary | ICD-10-CM | POA: Diagnosis present

## 2019-06-06 DIAGNOSIS — I2510 Atherosclerotic heart disease of native coronary artery without angina pectoris: Secondary | ICD-10-CM | POA: Diagnosis present

## 2019-06-06 DIAGNOSIS — D6862 Lupus anticoagulant syndrome: Secondary | ICD-10-CM | POA: Diagnosis present

## 2019-06-06 DIAGNOSIS — Z98891 History of uterine scar from previous surgery: Secondary | ICD-10-CM

## 2019-06-06 DIAGNOSIS — R0981 Nasal congestion: Secondary | ICD-10-CM | POA: Diagnosis not present

## 2019-06-06 DIAGNOSIS — Z791 Long term (current) use of non-steroidal anti-inflammatories (NSAID): Secondary | ICD-10-CM

## 2019-06-06 DIAGNOSIS — K3184 Gastroparesis: Secondary | ICD-10-CM | POA: Diagnosis present

## 2019-06-06 DIAGNOSIS — R55 Syncope and collapse: Secondary | ICD-10-CM | POA: Diagnosis present

## 2019-06-06 DIAGNOSIS — K9589 Other complications of other bariatric procedure: Secondary | ICD-10-CM | POA: Diagnosis present

## 2019-06-06 DIAGNOSIS — Z9884 Bariatric surgery status: Secondary | ICD-10-CM

## 2019-06-06 DIAGNOSIS — Z86718 Personal history of other venous thrombosis and embolism: Secondary | ICD-10-CM

## 2019-06-06 DIAGNOSIS — Z86711 Personal history of pulmonary embolism: Secondary | ICD-10-CM

## 2019-06-06 DIAGNOSIS — F314 Bipolar disorder, current episode depressed, severe, without psychotic features: Secondary | ICD-10-CM | POA: Diagnosis present

## 2019-06-06 DIAGNOSIS — Z7901 Long term (current) use of anticoagulants: Secondary | ICD-10-CM

## 2019-06-06 DIAGNOSIS — I69351 Hemiplegia and hemiparesis following cerebral infarction affecting right dominant side: Secondary | ICD-10-CM

## 2019-06-06 DIAGNOSIS — Z95828 Presence of other vascular implants and grafts: Secondary | ICD-10-CM

## 2019-06-06 DIAGNOSIS — G894 Chronic pain syndrome: Secondary | ICD-10-CM | POA: Diagnosis present

## 2019-06-06 DIAGNOSIS — Z883 Allergy status to other anti-infective agents status: Secondary | ICD-10-CM

## 2019-06-06 DIAGNOSIS — R001 Bradycardia, unspecified: Secondary | ICD-10-CM | POA: Diagnosis present

## 2019-06-06 DIAGNOSIS — R011 Cardiac murmur, unspecified: Secondary | ICD-10-CM | POA: Diagnosis present

## 2019-06-06 DIAGNOSIS — Z818 Family history of other mental and behavioral disorders: Secondary | ICD-10-CM

## 2019-06-06 DIAGNOSIS — K8689 Other specified diseases of pancreas: Secondary | ICD-10-CM | POA: Diagnosis present

## 2019-06-06 DIAGNOSIS — Z91048 Other nonmedicinal substance allergy status: Secondary | ICD-10-CM

## 2019-06-06 DIAGNOSIS — M359 Systemic involvement of connective tissue, unspecified: Secondary | ICD-10-CM | POA: Diagnosis present

## 2019-06-06 DIAGNOSIS — Z91040 Latex allergy status: Secondary | ICD-10-CM

## 2019-06-06 DIAGNOSIS — Z79899 Other long term (current) drug therapy: Secondary | ICD-10-CM

## 2019-06-06 DIAGNOSIS — Z881 Allergy status to other antibiotic agents status: Secondary | ICD-10-CM

## 2019-06-06 DIAGNOSIS — G8929 Other chronic pain: Secondary | ICD-10-CM

## 2019-06-06 DIAGNOSIS — Z9089 Acquired absence of other organs: Secondary | ICD-10-CM

## 2019-06-06 DIAGNOSIS — R1033 Periumbilical pain: Secondary | ICD-10-CM

## 2019-06-06 DIAGNOSIS — Z20822 Contact with and (suspected) exposure to covid-19: Secondary | ICD-10-CM | POA: Diagnosis present

## 2019-06-06 LAB — URINALYSIS, COMPLETE (UACMP) WITH MICROSCOPIC
Bacteria, UA: NONE SEEN
Bilirubin Urine: NEGATIVE
Glucose, UA: NEGATIVE mg/dL
Hgb urine dipstick: NEGATIVE
Ketones, ur: NEGATIVE mg/dL
Nitrite: NEGATIVE
Protein, ur: NEGATIVE mg/dL
Specific Gravity, Urine: 1.016 (ref 1.005–1.030)
pH: 5 (ref 5.0–8.0)

## 2019-06-06 LAB — URINE DRUG SCREEN, QUALITATIVE (ARMC ONLY)
Amphetamines, Ur Screen: NOT DETECTED
Barbiturates, Ur Screen: NOT DETECTED
Benzodiazepine, Ur Scrn: NOT DETECTED
Cannabinoid 50 Ng, Ur ~~LOC~~: NOT DETECTED
Cocaine Metabolite,Ur ~~LOC~~: NOT DETECTED
MDMA (Ecstasy)Ur Screen: NOT DETECTED
Methadone Scn, Ur: NOT DETECTED
Opiate, Ur Screen: NOT DETECTED
Phencyclidine (PCP) Ur S: NOT DETECTED
Tricyclic, Ur Screen: NOT DETECTED

## 2019-06-06 LAB — TROPONIN I (HIGH SENSITIVITY)
Troponin I (High Sensitivity): 4 ng/L (ref <18)
Troponin I (High Sensitivity): 4 ng/L (ref <18)

## 2019-06-06 LAB — CBC WITH DIFFERENTIAL/PLATELET
Abs Immature Granulocytes: 0.02 10*3/uL (ref 0.00–0.07)
Basophils Absolute: 0.1 10*3/uL (ref 0.0–0.1)
Basophils Relative: 1 %
Eosinophils Absolute: 0.1 10*3/uL (ref 0.0–0.5)
Eosinophils Relative: 2 %
HCT: 35.9 % — ABNORMAL LOW (ref 36.0–46.0)
Hemoglobin: 11.3 g/dL — ABNORMAL LOW (ref 12.0–15.0)
Immature Granulocytes: 0 %
Lymphocytes Relative: 32 %
Lymphs Abs: 1.5 10*3/uL (ref 0.7–4.0)
MCH: 28.5 pg (ref 26.0–34.0)
MCHC: 31.5 g/dL (ref 30.0–36.0)
MCV: 90.7 fL (ref 80.0–100.0)
Monocytes Absolute: 0.3 10*3/uL (ref 0.1–1.0)
Monocytes Relative: 7 %
Neutro Abs: 2.8 10*3/uL (ref 1.7–7.7)
Neutrophils Relative %: 58 %
Platelets: 179 10*3/uL (ref 150–400)
RBC: 3.96 MIL/uL (ref 3.87–5.11)
RDW: 14.6 % (ref 11.5–15.5)
WBC: 4.8 10*3/uL (ref 4.0–10.5)
nRBC: 0 % (ref 0.0–0.2)

## 2019-06-06 LAB — COMPREHENSIVE METABOLIC PANEL
ALT: 23 U/L (ref 0–44)
AST: 19 U/L (ref 15–41)
Albumin: 3.4 g/dL — ABNORMAL LOW (ref 3.5–5.0)
Alkaline Phosphatase: 84 U/L (ref 38–126)
Anion gap: 4 — ABNORMAL LOW (ref 5–15)
BUN: 13 mg/dL (ref 6–20)
CO2: 23 mmol/L (ref 22–32)
Calcium: 8.4 mg/dL — ABNORMAL LOW (ref 8.9–10.3)
Chloride: 110 mmol/L (ref 98–111)
Creatinine, Ser: 0.97 mg/dL (ref 0.44–1.00)
GFR calc Af Amer: >60 mL/min (ref >60)
GFR calc non Af Amer: >60 mL/min (ref >60)
Glucose, Bld: 111 mg/dL — ABNORMAL HIGH (ref 70–99)
Potassium: 3.8 mmol/L (ref 3.5–5.1)
Sodium: 137 mmol/L (ref 135–145)
Total Bilirubin: 0.4 mg/dL (ref 0.3–1.2)
Total Protein: 6.1 g/dL — ABNORMAL LOW (ref 6.5–8.1)

## 2019-06-06 LAB — ETHANOL: Alcohol, Ethyl (B): <10 mg/dL (ref <10)

## 2019-06-06 LAB — PROTIME-INR
INR: 1.1 (ref 0.8–1.2)
Prothrombin Time: 13.8 seconds (ref 11.4–15.2)

## 2019-06-06 LAB — LACTIC ACID, PLASMA: Lactic Acid, Venous: 1.6 mmol/L (ref 0.5–1.9)

## 2019-06-06 MED ORDER — SODIUM CHLORIDE 0.9 % IV BOLUS
1000.0000 mL | Freq: Once | INTRAVENOUS | Status: AC
  Administered 2019-06-06: 1000 mL via INTRAVENOUS

## 2019-06-06 MED ORDER — LACTATED RINGERS IV BOLUS (SEPSIS)
1000.0000 mL | Freq: Once | INTRAVENOUS | Status: AC
  Administered 2019-06-07: 1000 mL via INTRAVENOUS

## 2019-06-06 MED ORDER — FENTANYL CITRATE (PF) 100 MCG/2ML IJ SOLN
25.0000 ug | Freq: Once | INTRAMUSCULAR | Status: AC
  Administered 2019-06-06: 25 ug via INTRAVENOUS
  Filled 2019-06-06: qty 2

## 2019-06-06 MED ORDER — IOHEXOL 350 MG/ML SOLN
100.0000 mL | Freq: Once | INTRAVENOUS | Status: AC | PRN
  Administered 2019-06-06: 100 mL via INTRAVENOUS

## 2019-06-06 MED ORDER — METRONIDAZOLE IN NACL 5-0.79 MG/ML-% IV SOLN
500.0000 mg | Freq: Once | INTRAVENOUS | Status: AC
  Administered 2019-06-07: 500 mg via INTRAVENOUS
  Filled 2019-06-06: qty 100

## 2019-06-06 MED ORDER — KETOROLAC TROMETHAMINE 30 MG/ML IJ SOLN
30.0000 mg | Freq: Once | INTRAMUSCULAR | Status: AC
  Administered 2019-06-06: 30 mg via INTRAVENOUS
  Filled 2019-06-06: qty 1

## 2019-06-06 MED ORDER — LACTATED RINGERS IV BOLUS (SEPSIS)
250.0000 mL | Freq: Once | INTRAVENOUS | Status: AC
  Administered 2019-06-07: 250 mL via INTRAVENOUS

## 2019-06-06 MED ORDER — IOHEXOL 9 MG/ML PO SOLN
500.0000 mL | Freq: Two times a day (BID) | ORAL | Status: DC | PRN
  Administered 2019-06-06: 500 mL via ORAL
  Filled 2019-06-06: qty 500

## 2019-06-06 MED ORDER — SODIUM CHLORIDE 0.9 % IV SOLN
2.0000 g | Freq: Once | INTRAVENOUS | Status: AC
  Administered 2019-06-07: 2 g via INTRAVENOUS
  Filled 2019-06-06: qty 2

## 2019-06-06 MED ORDER — VANCOMYCIN HCL IN DEXTROSE 1-5 GM/200ML-% IV SOLN
1000.0000 mg | Freq: Once | INTRAVENOUS | Status: AC
  Administered 2019-06-07: 1000 mg via INTRAVENOUS
  Filled 2019-06-06: qty 200

## 2019-06-06 NOTE — ED Notes (Signed)
Lab reports only able to get one set of cultures due to difficult stick.  Dr Darl Householder notified, ok

## 2019-06-06 NOTE — ED Notes (Signed)
Pt reporting 8/10 chest pain that radiates to back, requesting pain medication. Dr. Owens Shark notified, to place orders

## 2019-06-06 NOTE — ED Notes (Signed)
Lab at bedside

## 2019-06-06 NOTE — ED Notes (Signed)
Pt in CT at this time.

## 2019-06-06 NOTE — ED Notes (Signed)
Pt BP [id number] systolic. Dr. Owens Shark to bedside to reassess.  Pt alert and oriented

## 2019-06-06 NOTE — ED Notes (Signed)
Pt assisted with bed pan to urinate at this time

## 2019-06-06 NOTE — Progress Notes (Signed)
PHARMACY -  BRIEF ANTIBIOTIC NOTE   Pharmacy has received consult(s) for Aztreonam and Vancomycin from an ED provider.  The patient's profile has been reviewed for ht/wt/allergies/indication/available labs.    One time order(s) placed for Vancomycin 1gm and Aztreonam 2gm  Further antibiotics/pharmacy consults should be ordered by admitting physician if indicated.                       Thank you, Hart Robinsons A 06/06/2019  11:51 PM

## 2019-06-06 NOTE — ED Triage Notes (Signed)
Pt arrives via EMS form home for CP that started yesterday- pt describes it as a pressure- pt has a hx of 4 MI's and 4 strokes- pt has had 324 of ASA- bp per ems 84/59

## 2019-06-06 NOTE — ED Notes (Signed)
Report given to Brianna, RN

## 2019-06-06 NOTE — Progress Notes (Signed)
CODE SEPSIS - PHARMACY COMMUNICATION  **Broad Spectrum Antibiotics should be administered within 1 hour of Sepsis diagnosis**  Time Code Sepsis Called/Page Received: 2350  Antibiotics Ordered: Aztreonam, Vancomycin  Time of 1st antibiotic administration: 0015  Additional action taken by pharmacy: n/a  If necessary, Name of Provider/Nurse Contacted: n/a    Ena Dawley ,PharmD Clinical Pharmacist  06/06/2019  11:51 PM
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
## 2019-06-06 NOTE — ED Notes (Signed)
Dr Darl Householder at bedside to get [place] IV

## 2019-06-07 DIAGNOSIS — G8929 Other chronic pain: Secondary | ICD-10-CM

## 2019-06-07 DIAGNOSIS — Z20822 Contact with and (suspected) exposure to covid-19: Secondary | ICD-10-CM | POA: Diagnosis present

## 2019-06-07 DIAGNOSIS — K219 Gastro-esophageal reflux disease without esophagitis: Secondary | ICD-10-CM | POA: Diagnosis present

## 2019-06-07 DIAGNOSIS — F419 Anxiety disorder, unspecified: Secondary | ICD-10-CM

## 2019-06-07 DIAGNOSIS — R109 Unspecified abdominal pain: Secondary | ICD-10-CM

## 2019-06-07 DIAGNOSIS — J4520 Mild intermittent asthma, uncomplicated: Secondary | ICD-10-CM | POA: Diagnosis present

## 2019-06-07 DIAGNOSIS — K3184 Gastroparesis: Secondary | ICD-10-CM | POA: Diagnosis present

## 2019-06-07 DIAGNOSIS — R197 Diarrhea, unspecified: Secondary | ICD-10-CM

## 2019-06-07 DIAGNOSIS — K8689 Other specified diseases of pancreas: Secondary | ICD-10-CM | POA: Diagnosis present

## 2019-06-07 DIAGNOSIS — K5900 Constipation, unspecified: Secondary | ICD-10-CM | POA: Diagnosis present

## 2019-06-07 DIAGNOSIS — R55 Syncope and collapse: Secondary | ICD-10-CM

## 2019-06-07 DIAGNOSIS — G894 Chronic pain syndrome: Secondary | ICD-10-CM | POA: Diagnosis present

## 2019-06-07 DIAGNOSIS — K912 Postsurgical malabsorption, not elsewhere classified: Secondary | ICD-10-CM | POA: Diagnosis present

## 2019-06-07 DIAGNOSIS — Z79899 Other long term (current) drug therapy: Secondary | ICD-10-CM

## 2019-06-07 DIAGNOSIS — I959 Hypotension, unspecified: Secondary | ICD-10-CM

## 2019-06-07 DIAGNOSIS — M797 Fibromyalgia: Secondary | ICD-10-CM | POA: Diagnosis present

## 2019-06-07 DIAGNOSIS — R112 Nausea with vomiting, unspecified: Secondary | ICD-10-CM | POA: Insufficient documentation

## 2019-06-07 DIAGNOSIS — R011 Cardiac murmur, unspecified: Secondary | ICD-10-CM | POA: Diagnosis present

## 2019-06-07 DIAGNOSIS — R0981 Nasal congestion: Secondary | ICD-10-CM | POA: Diagnosis not present

## 2019-06-07 DIAGNOSIS — Y832 Surgical operation with anastomosis, bypass or graft as the cause of abnormal reaction of the patient, or of later complication, without mention of misadventure at the time of the procedure: Secondary | ICD-10-CM | POA: Diagnosis present

## 2019-06-07 DIAGNOSIS — R001 Bradycardia, unspecified: Secondary | ICD-10-CM

## 2019-06-07 DIAGNOSIS — I951 Orthostatic hypotension: Secondary | ICD-10-CM | POA: Diagnosis present

## 2019-06-07 DIAGNOSIS — F329 Major depressive disorder, single episode, unspecified: Secondary | ICD-10-CM

## 2019-06-07 DIAGNOSIS — F1120 Opioid dependence, uncomplicated: Secondary | ICD-10-CM | POA: Diagnosis present

## 2019-06-07 DIAGNOSIS — R1033 Periumbilical pain: Secondary | ICD-10-CM | POA: Diagnosis not present

## 2019-06-07 DIAGNOSIS — F314 Bipolar disorder, current episode depressed, severe, without psychotic features: Secondary | ICD-10-CM | POA: Diagnosis present

## 2019-06-07 DIAGNOSIS — K632 Fistula of intestine: Secondary | ICD-10-CM | POA: Diagnosis present

## 2019-06-07 DIAGNOSIS — F603 Borderline personality disorder: Secondary | ICD-10-CM | POA: Diagnosis present

## 2019-06-07 DIAGNOSIS — K9189 Other postprocedural complications and disorders of digestive system: Secondary | ICD-10-CM | POA: Diagnosis not present

## 2019-06-07 DIAGNOSIS — I69351 Hemiplegia and hemiparesis following cerebral infarction affecting right dominant side: Secondary | ICD-10-CM | POA: Diagnosis not present

## 2019-06-07 DIAGNOSIS — M359 Systemic involvement of connective tissue, unspecified: Secondary | ICD-10-CM | POA: Diagnosis present

## 2019-06-07 DIAGNOSIS — K9589 Other complications of other bariatric procedure: Secondary | ICD-10-CM | POA: Diagnosis present

## 2019-06-07 DIAGNOSIS — I2510 Atherosclerotic heart disease of native coronary artery without angina pectoris: Secondary | ICD-10-CM | POA: Diagnosis present

## 2019-06-07 DIAGNOSIS — D696 Thrombocytopenia, unspecified: Secondary | ICD-10-CM | POA: Diagnosis not present

## 2019-06-07 DIAGNOSIS — D6862 Lupus anticoagulant syndrome: Secondary | ICD-10-CM | POA: Diagnosis present

## 2019-06-07 LAB — GLUCOSE, CAPILLARY: Glucose-Capillary: 96 mg/dL (ref 70–99)

## 2019-06-07 LAB — MAGNESIUM: Magnesium: 1.9 mg/dL (ref 1.7–2.4)

## 2019-06-07 LAB — TSH: TSH: 2.725 u[IU]/mL (ref 0.350–4.500)

## 2019-06-07 LAB — SARS CORONAVIRUS 2 (TAT 6-24 HRS): SARS Coronavirus 2: NEGATIVE

## 2019-06-07 MED ORDER — METHADONE HCL 10 MG PO TABS
70.0000 mg | ORAL_TABLET | Freq: Two times a day (BID) | ORAL | Status: DC
  Administered 2019-06-07: 70 mg via ORAL
  Filled 2019-06-07: qty 7

## 2019-06-07 MED ORDER — TRAZODONE HCL 50 MG PO TABS
300.0000 mg | ORAL_TABLET | Freq: Every day | ORAL | Status: DC
  Administered 2019-06-07 – 2019-06-13 (×8): 300 mg via ORAL
  Filled 2019-06-07 (×7): qty 6
  Filled 2019-06-07: qty 3

## 2019-06-07 MED ORDER — VORTIOXETINE HBR 5 MG PO TABS
20.0000 mg | ORAL_TABLET | Freq: Every day | ORAL | Status: DC
  Administered 2019-06-07 – 2019-06-14 (×8): 20 mg via ORAL
  Filled 2019-06-07 (×9): qty 4

## 2019-06-07 MED ORDER — PANTOPRAZOLE SODIUM 40 MG PO TBEC
40.0000 mg | DELAYED_RELEASE_TABLET | Freq: Every day | ORAL | Status: DC
  Administered 2019-06-07 – 2019-06-14 (×8): 40 mg via ORAL
  Filled 2019-06-07 (×8): qty 1

## 2019-06-07 MED ORDER — LURASIDONE HCL 40 MG PO TABS
40.0000 mg | ORAL_TABLET | Freq: Every day | ORAL | Status: DC
  Administered 2019-06-07 – 2019-06-14 (×7): 40 mg via ORAL
  Filled 2019-06-07 (×8): qty 1

## 2019-06-07 MED ORDER — METRONIDAZOLE IN NACL 5-0.79 MG/ML-% IV SOLN
500.0000 mg | Freq: Three times a day (TID) | INTRAVENOUS | Status: DC
  Administered 2019-06-07 – 2019-06-10 (×9): 500 mg via INTRAVENOUS
  Filled 2019-06-07 (×11): qty 100

## 2019-06-07 MED ORDER — ACETAMINOPHEN 650 MG RE SUPP: 650.0000 mg | Freq: Four times a day (QID) | RECTAL | Status: DC | PRN

## 2019-06-07 MED ORDER — DICLOFENAC SODIUM 1 % TD GEL
2.0000 g | Freq: Four times a day (QID) | TRANSDERMAL | Status: DC
  Administered 2019-06-07 – 2019-06-14 (×22): 2 g via TOPICAL
  Filled 2019-06-07 (×2): qty 100

## 2019-06-07 MED ORDER — CLONAZEPAM 1 MG PO TABS: 1.0000 mg | ORAL_TABLET | Freq: Three times a day (TID) | ORAL | Status: DC

## 2019-06-07 MED ORDER — ONDANSETRON HCL 4 MG PO TABS: 4.0000 mg | ORAL_TABLET | Freq: Four times a day (QID) | ORAL | Status: DC | PRN

## 2019-06-07 MED ORDER — ONDANSETRON HCL 4 MG PO TABS
8.0000 mg | ORAL_TABLET | Freq: Three times a day (TID) | ORAL | Status: DC | PRN
  Administered 2019-06-11 – 2019-06-14 (×2): 8 mg via ORAL
  Filled 2019-06-07 (×2): qty 2

## 2019-06-07 MED ORDER — ENOXAPARIN SODIUM 80 MG/0.8ML ~~LOC~~ SOLN
70.0000 mg | Freq: Two times a day (BID) | SUBCUTANEOUS | Status: DC
  Administered 2019-06-07 – 2019-06-09 (×5): 70 mg via SUBCUTANEOUS
  Filled 2019-06-07 (×6): qty 0.8

## 2019-06-07 MED ORDER — SODIUM CHLORIDE 0.9% FLUSH
3.0000 mL | Freq: Two times a day (BID) | INTRAVENOUS | Status: DC
  Administered 2019-06-07 – 2019-06-14 (×10): 3 mL via INTRAVENOUS

## 2019-06-07 MED ORDER — ZOLPIDEM TARTRATE 5 MG PO TABS
5.0000 mg | ORAL_TABLET | Freq: Every day | ORAL | Status: DC
  Administered 2019-06-07 – 2019-06-13 (×7): 5 mg via ORAL
  Filled 2019-06-07 (×7): qty 1

## 2019-06-07 MED ORDER — GABAPENTIN 600 MG PO TABS
600.0000 mg | ORAL_TABLET | Freq: Three times a day (TID) | ORAL | Status: DC
  Administered 2019-06-07 – 2019-06-14 (×21): 600 mg via ORAL
  Filled 2019-06-07 (×21): qty 1

## 2019-06-07 MED ORDER — BUDESONIDE 0.5 MG/2ML IN SUSP
0.5000 mg | Freq: Two times a day (BID) | RESPIRATORY_TRACT | Status: DC
  Administered 2019-06-07 – 2019-06-09 (×5): 0.5 mg via RESPIRATORY_TRACT
  Filled 2019-06-07 (×8): qty 2

## 2019-06-07 MED ORDER — ROPINIROLE HCL 1 MG PO TABS
0.5000 mg | ORAL_TABLET | Freq: Every day | ORAL | Status: DC
  Administered 2019-06-07 – 2019-06-13 (×7): 0.5 mg via ORAL
  Filled 2019-06-07 (×2): qty 1
  Filled 2019-06-07: qty 2
  Filled 2019-06-07 (×5): qty 1

## 2019-06-07 MED ORDER — ACETAMINOPHEN 325 MG PO TABS: 650.0000 mg | ORAL_TABLET | Freq: Four times a day (QID) | ORAL | Status: DC | PRN

## 2019-06-07 MED ORDER — CLONAZEPAM 0.5 MG PO TABS
0.5000 mg | ORAL_TABLET | Freq: Three times a day (TID) | ORAL | Status: DC
  Administered 2019-06-07 – 2019-06-14 (×18): 0.5 mg via ORAL
  Filled 2019-06-07 (×18): qty 1

## 2019-06-07 MED ORDER — MUPIROCIN 2 % EX OINT
TOPICAL_OINTMENT | Freq: Two times a day (BID) | CUTANEOUS | Status: DC
  Filled 2019-06-07 (×2): qty 22

## 2019-06-07 MED ORDER — BUPROPION HCL ER (XL) 150 MG PO TB24
150.0000 mg | ORAL_TABLET | Freq: Every day | ORAL | Status: DC
  Administered 2019-06-07 – 2019-06-14 (×8): 150 mg via ORAL
  Filled 2019-06-07 (×10): qty 1

## 2019-06-07 MED ORDER — SODIUM CHLORIDE 0.9 % IV SOLN: INTRAVENOUS | Status: DC

## 2019-06-07 MED ORDER — ONDANSETRON HCL 4 MG/2ML IJ SOLN
4.0000 mg | Freq: Four times a day (QID) | INTRAMUSCULAR | Status: DC | PRN
  Administered 2019-06-07 – 2019-06-13 (×8): 4 mg via INTRAVENOUS
  Filled 2019-06-07 (×8): qty 2

## 2019-06-07 MED ORDER — TOPIRAMATE 100 MG PO TABS
200.0000 mg | ORAL_TABLET | Freq: Two times a day (BID) | ORAL | Status: DC
  Administered 2019-06-07 – 2019-06-14 (×10): 200 mg via ORAL
  Filled 2019-06-07 (×17): qty 2

## 2019-06-07 MED ORDER — PROMETHAZINE HCL 25 MG PO TABS
25.0000 mg | ORAL_TABLET | Freq: Three times a day (TID) | ORAL | Status: DC | PRN
  Administered 2019-06-09 – 2019-06-10 (×3): 25 mg via ORAL
  Filled 2019-06-07 (×5): qty 1

## 2019-06-07 MED ORDER — OXYCODONE HCL 5 MG PO TABS: 10.0000 mg | ORAL_TABLET | ORAL | Status: DC | PRN

## 2019-06-07 MED ORDER — IPRATROPIUM-ALBUTEROL 0.5-2.5 (3) MG/3ML IN SOLN
3.0000 mL | Freq: Four times a day (QID) | RESPIRATORY_TRACT | Status: DC
  Administered 2019-06-07 – 2019-06-08 (×6): 3 mL via RESPIRATORY_TRACT
  Filled 2019-06-07 (×6): qty 3

## 2019-06-07 MED ORDER — FLUTICASONE PROPIONATE 50 MCG/ACT NA SUSP
2.0000 | Freq: Every day | NASAL | Status: DC
  Administered 2019-06-07 – 2019-06-14 (×7): 2 via NASAL
  Filled 2019-06-07 (×2): qty 16

## 2019-06-07 MED ORDER — QUETIAPINE FUMARATE 25 MG PO TABS
25.0000 mg | ORAL_TABLET | Freq: Two times a day (BID) | ORAL | Status: DC
  Administered 2019-06-07 – 2019-06-14 (×12): 25 mg via ORAL
  Filled 2019-06-07 (×12): qty 1

## 2019-06-07 MED ORDER — ASPIRIN 81 MG PO CHEW
81.0000 mg | CHEWABLE_TABLET | Freq: Every day | ORAL | Status: DC
  Administered 2019-06-07 – 2019-06-14 (×8): 81 mg via ORAL
  Filled 2019-06-07 (×8): qty 1

## 2019-06-07 NOTE — ED Notes (Signed)
md at bedside to discuss plan of care and to go over home meds.

## 2019-06-07 NOTE — ED Notes (Signed)
Pt transported to [facility] by Solmon Ice, EDT. This RN to bedside, pt placed on monitor by this RN. Orthostatic VS obtained by this RN upon patient arrival to RN. Unable to complete orthostatic VS upon arrival to ED due to patient systolic BP 87 when sitting, pt remains A&O x4, assisted back to lying position by this RN without incident.

## 2019-06-07 NOTE — ED Notes (Signed)
Lab here for blood culture

## 2019-06-07 NOTE — ED Notes (Signed)
Pt given bath basin with warm water, soap, deodorant, a clean gown, toothbrush and toothpaste per request. Pt is able to bathe self.

## 2019-06-07 NOTE — ED Notes (Signed)
Report given to receiving nurse Catalina Pizza. Patient awaittig transport.

## 2019-06-07 NOTE — ED Notes (Signed)
Pt called out using call light requesting ginger ale. Pt was given two ginger ales. Pt also stated "who do I need to talk to about getting my diet order changed? They have me on a heart healthy diet and I am always on a regular diet." Pt made aware that this tech will send MD a message. Message was sent to MD [place]. Waiting for reply.

## 2019-06-07 NOTE — Progress Notes (Signed)
PT Cancellation Note  Patient Details Name: Leslie Marks MRN: [id number] DOB: [birth year]   Cancelled Treatment:    Reason Eval/Treat Not Completed: (Consult received and chart reviewed. Patient refusing session at this time, "feel bad"; awaiting breathing treatment/medications and additional pain medications per her report.  RN informed/aware.  Will continue efforts at later time/date as medically appropriate and available.)   [name] H. Owens Shark, PT, DPT, NCS 06/07/19, 10:46 AM [phone]

## 2019-06-07 NOTE — ED Notes (Signed)
Pt called out using call light. This tech went in rm. Pt stated "my other IV came out." This tech noted pt second IV from L AC was out of arm. IV stet disposed of in sharpes container and pt given wet washcloth to clean arm. Pt given two cups of ice and a ginger ale per request. Pt placed back on cardiac monitor, purewick placed and call light within reach. Pt has no further needs at this time.

## 2019-06-07 NOTE — ED Notes (Signed)
Pt given meal tray.

## 2019-06-07 NOTE — ED Notes (Signed)
Additional ginger ale provided.

## 2019-06-07 NOTE — ED Notes (Signed)
Pt drinking ice ginger ale. Will need to take temp later.

## 2019-06-07 NOTE — ED Notes (Signed)
Pt noted to have removed all monitoring equipment. Per EDT pt states "it is aggravating".

## 2019-06-07 NOTE — ED Notes (Signed)
Pt ringing call bell again, sheets changed due to soiling. Call bell at right side. purewick placed by pt.

## 2019-06-07 NOTE — ED Notes (Signed)
Report to kim, rn.  

## 2019-06-07 NOTE — ED Notes (Signed)
Pt asked if she could stand at sink and wash hair. Per Beckie Busing "pt can not stand at sink she can wait until tomorrow to take a shower." Pt informed of what Tomasa Hosteller, RN stated. Pt stated to this tech "I don't care what she says. My hair is greasy and needs washed. I am going to wash it." While pt was getting out of bed her IV in the L forearm came out on accident. Jeannette,RN made aware. IV was removed. Pt stood at sink and washed her hair with this tech in the rm at all times. Pt steady on feet while washing hair. Pt brushed teeth as well. Bed linen changed.

## 2019-06-07 NOTE — ED Notes (Signed)
Pt given ginger aile as requested
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
## 2019-06-07 NOTE — ED Notes (Signed)
Admit bed changed to room 105

## 2019-06-07 NOTE — ED Notes (Addendum)
Pt requesting inhaler. No resp distress noted.  Leslie Marks,, np notified.

## 2019-06-07 NOTE — ED Notes (Signed)
Patient requesting pain med. Advised that admitting doctor will gho over home meds with her and once they have been verified will administer. Lunch tray provided. 1 liter NS completed.

## 2019-06-07 NOTE — ED Notes (Signed)
Notified by lab that only one set of blood cultures were obtained. Spoke with Rufina Falco, np to notify of only one set obtained and antibiotics were given by previous RN. Ouma, np would like additional set obtained, yeni in lab notified of need for venipuncture assist.

## 2019-06-07 NOTE — Progress Notes (Signed)
Patient arrived to unit. Breathing is even and unlabored. No distress is noted. All safety measures in place. IV team placed new IV In left forearm prior to coming on to unit. Patient's vital signs are within normal limits. No distress noted. Patient is settled in. Will continue to monitor and pass along to night shift.

## 2019-06-07 NOTE — ED Notes (Signed)
PT concerned over missing medications. Have reached out to pharmacy and Dr. Leslye Peer. Awaiting to hear back from MD.

## 2019-06-07 NOTE — Progress Notes (Signed)
Patient ID: Leslie Marks, female   DOB: 03/13/1970, 49 y.o.   MRN: [id number] [facility] PROGRESS NOTE  Leslie Marks [id number] DOB: 10-16-1970 DOA: 06/06/2019 PCP: Patient, No Pcp Per  HPI/Subjective: Patient with multiple complaints.  The first thing is her medication.  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  Pharmacy called over there and she has not been there in a long time.  She states that she has been prescribed Klonopin because her son recently died.  Patient states that she has been having nausea vomiting going on for a week.  She has been having drainage coming out of her abdomen.  One of them has been a chronic drainage and the other 1 just recently started.  She has not been taking any of her medications in the last week because she has been so sick.  Her blood pressure and heart rate were low when she came in.  She passed out and she was sweating and she initially had fast heart rate and it dipped down and then she passed out.  Objective: Vitals:   06/07/19 1223 06/07/19 1338  BP: (!) 109/58 104/60  Pulse: 68 85  Resp: 16 (!) 24  Temp: 98.3 F (36.8 C)   SpO2: 96% 98%    Intake/Output Summary (Last 24 hours) at 06/07/2019 1445 Last data filed at 06/07/2019 1135 Gross per 24 hour  Intake 4683.6 ml  Output 1500 ml  Net 3183.6 ml   Filed Weights   06/06/19 2119  Weight: 68 kg    ROS: Review of Systems  Constitutional: Negative for chills and fever.  HENT: Positive for congestion.   Eyes: Negative for blurred vision.  Respiratory: Negative for cough and shortness of breath.   Cardiovascular: Positive for palpitations. Negative for chest pain.  Gastrointestinal: Positive for abdominal pain, diarrhea, nausea and vomiting. Negative for constipation.  Genitourinary: Negative for dysuria.  Musculoskeletal: Negative for joint pain.  Neurological: Negative for dizziness.   Exam: Physical Exam  Constitutional: She is  oriented to person, place, and time.  HENT:  Nose: No mucosal edema.  Mouth/Throat: No oropharyngeal exudate or posterior oropharyngeal edema.  Eyes: Conjunctivae and lids are normal.  Neck: Carotid bruit is not present.  Cardiovascular: S1 normal and S2 normal. Exam reveals no gallop.  Murmur heard.  Systolic murmur is present with a grade of 2/6. Respiratory: No respiratory distress. She has decreased breath sounds in the right lower field and the left lower field. She has wheezes in the right lower field and the left lower field. She has no rhonchi. She has no rales.  GI: Soft. Bowel sounds are normal. There is abdominal tenderness.  2 areas that are draining in the abdomen  Musculoskeletal:     Right ankle: No swelling.     Left ankle: No swelling.  Lymphadenopathy:    She has no cervical adenopathy.  Neurological: She is alert and oriented to person, place, and time. No cranial nerve deficit.  Skin: Skin is warm. Nails show no clubbing.  Irritation under nose 2 areas that are draining in the abdomen  Psychiatric: She has a normal mood and affect.      Data Reviewed: Basic Metabolic Panel: Recent Labs  Lab 06/06/19 2131 06/06/19 2306  NA 137  --   K 3.8  --   CL 110  --   CO2 23  --   GLUCOSE 111*  --   BUN 13  --   CREATININE  0.97  --   CALCIUM 8.4*  --   MG  --  1.9   Liver Function Tests: Recent Labs  Lab 06/06/19 2131  AST 19  ALT 23  ALKPHOS 84  BILITOT 0.4  PROT 6.1*  ALBUMIN 3.4*   CBC: Recent Labs  Lab 06/06/19 2131  WBC 4.8  NEUTROABS 2.8  HGB 11.3*  HCT 35.9*  MCV 90.7  PLT 179    CBG: Recent Labs  Lab 06/07/19 0452  GLUCAP 96    Recent Results (from the past 240 hour(s))  Blood culture (routine x 2)     Status: None (Preliminary result)   Collection Time: 06/06/19 10:49 PM   Specimen: BLOOD  Result Value Ref Range Status   Specimen Description BLOOD LEFT WRIST  Final   Special Requests   Final    BOTTLES DRAWN AEROBIC AND  ANAEROBIC Blood Culture results may not be optimal due to an inadequate volume of blood received in culture bottles   Culture   Final    NO GROWTH < 12 HOURS Performed at [facility], [address], [place], [place] [address]    Report Status PENDING  Incomplete     Studies: CT Angio Chest PE W and/or Wo Contrast  Result Date: 06/07/2019 CLINICAL DATA:  History of pulmonary embolism. Prior gastric bypass with abdominal fistula. Presenting with draining fistula and chest pain. EXAM: CT ANGIOGRAPHY CHEST CT ABDOMEN AND PELVIS WITH CONTRAST TECHNIQUE: Multidetector CT imaging of the chest was performed using the standard protocol during bolus administration of intravenous contrast. Multiplanar CT image reconstructions and MIPs were obtained to evaluate the vascular anatomy. Multidetector CT imaging of the abdomen and pelvis was performed using the standard protocol during bolus administration of intravenous contrast. CONTRAST:  159mL OMNIPAQUE IOHEXOL 350 MG/ML SOLN COMPARISON:  None. FINDINGS: CTA CHEST FINDINGS Cardiovascular: Contrast injection is sufficient to demonstrate satisfactory opacification of the pulmonary arteries to the segmental level. There is no pulmonary embolus. The main pulmonary artery is within normal limits for size. There is no CT evidence of acute right heart strain. The visualized aorta is normal. There is a normal 3-vessel arch branching pattern. Heart size is normal, without pericardial effusion. Mediastinum/Nodes: No mediastinal, hilar or axillary lymphadenopathy. The visualized thyroid and thoracic esophageal course are unremarkable. Lungs/Pleura: No pulmonary nodules or masses. No pleural effusion or pneumothorax. No focal airspace consolidation. No focal pleural abnormality. Musculoskeletal: No chest wall abnormality. No acute or significant osseous findings. Review of the MIP images confirms the above findings. CT ABDOMEN and PELVIS FINDINGS Hepatobiliary: Normal  hepatic contours and density. Unchanged chronic biliary dilatation. Status post cholecystectomy. Pancreas: Normal contours without ductal dilatation. No peripancreatic fluid collection. Spleen: Normal. Adrenals/Urinary Tract: --Adrenal glands: Normal. --Right kidney/ureter: No hydronephrosis or perinephric stranding. No nephrolithiasis. No obstructing ureteral stones. --Left kidney/ureter: No hydronephrosis or perinephric stranding. No nephrolithiasis. No obstructing ureteral stones. --Urinary bladder: Unremarkable. Stomach/Bowel: --Stomach/Duodenum: Status post gastric bypass. --Small bowel: No dilatation or inflammation. --Colon: No focal abnormality. --Appendix: Normal. Vascular/Lymphatic: Unchanged appearance of IVC filter. No abdominal or pelvic lymphadenopathy. Reproductive: Status post hysterectomy. No adnexal mass. Musculoskeletal. No bony spinal canal stenosis or focal osseous abnormality. Other: Lower midline ventral abdominal soft tissue wound, unchanged. No abscess or drainable fluid collection. IMPRESSION: 1. No pulmonary embolus or acute aortic syndrome. 2. No acute abnormality of the chest, abdomen or pelvis. 3. Lower midline ventral abdominal wound, unchanged and likely the site of the reported draining fistula. No abscess or fluid collection.  4. Unchanged appearance of IVC filter with limbs protruding beyond the lumen of the vessel. Electronically Signed   By: Ulyses Jarred M.D.   On: 06/07/2019 00:37   CT ABDOMEN PELVIS W CONTRAST  Result Date: 06/07/2019 CLINICAL DATA:  History of pulmonary embolism. Prior gastric bypass with abdominal fistula. Presenting with draining fistula and chest pain. EXAM: CT ANGIOGRAPHY CHEST CT ABDOMEN AND PELVIS WITH CONTRAST TECHNIQUE: Multidetector CT imaging of the chest was performed using the standard protocol during bolus administration of intravenous contrast. Multiplanar CT image reconstructions and MIPs were obtained to evaluate the vascular anatomy.  Multidetector CT imaging of the abdomen and pelvis was performed using the standard protocol during bolus administration of intravenous contrast. CONTRAST:  174mL OMNIPAQUE IOHEXOL 350 MG/ML SOLN COMPARISON:  None. FINDINGS: CTA CHEST FINDINGS Cardiovascular: Contrast injection is sufficient to demonstrate satisfactory opacification of the pulmonary arteries to the segmental level. There is no pulmonary embolus. The main pulmonary artery is within normal limits for size. There is no CT evidence of acute right heart strain. The visualized aorta is normal. There is a normal 3-vessel arch branching pattern. Heart size is normal, without pericardial effusion. Mediastinum/Nodes: No mediastinal, hilar or axillary lymphadenopathy. The visualized thyroid and thoracic esophageal course are unremarkable. Lungs/Pleura: No pulmonary nodules or masses. No pleural effusion or pneumothorax. No focal airspace consolidation. No focal pleural abnormality. Musculoskeletal: No chest wall abnormality. No acute or significant osseous findings. Review of the MIP images confirms the above findings. CT ABDOMEN and PELVIS FINDINGS Hepatobiliary: Normal hepatic contours and density. Unchanged chronic biliary dilatation. Status post cholecystectomy. Pancreas: Normal contours without ductal dilatation. No peripancreatic fluid collection. Spleen: Normal. Adrenals/Urinary Tract: --Adrenal glands: Normal. --Right kidney/ureter: No hydronephrosis or perinephric stranding. No nephrolithiasis. No obstructing ureteral stones. --Left kidney/ureter: No hydronephrosis or perinephric stranding. No nephrolithiasis. No obstructing ureteral stones. --Urinary bladder: Unremarkable. Stomach/Bowel: --Stomach/Duodenum: Status post gastric bypass. --Small bowel: No dilatation or inflammation. --Colon: No focal abnormality. --Appendix: Normal. Vascular/Lymphatic: Unchanged appearance of IVC filter. No abdominal or pelvic lymphadenopathy. Reproductive: Status post  hysterectomy. No adnexal mass. Musculoskeletal. No bony spinal canal stenosis or focal osseous abnormality. Other: Lower midline ventral abdominal soft tissue wound, unchanged. No abscess or drainable fluid collection. IMPRESSION: 1. No pulmonary embolus or acute aortic syndrome. 2. No acute abnormality of the chest, abdomen or pelvis. 3. Lower midline ventral abdominal wound, unchanged and likely the site of the reported draining fistula. No abscess or fluid collection. 4. Unchanged appearance of IVC filter with limbs protruding beyond the lumen of the vessel. Electronically Signed   By: Ulyses Jarred M.D.   On: 06/07/2019 00:37   DG Chest Portable 1 View  Result Date: 06/06/2019 CLINICAL DATA:  49 year old female with chest pain. EXAM: PORTABLE CHEST 1 VIEW COMPARISON:  Chest radiograph dated 01/31/2019. FINDINGS: The heart size and mediastinal contours are within normal limits. Both lungs are clear. The visualized skeletal structures are unremarkable. IMPRESSION: No active disease. Electronically Signed   By: Anner Crete M.D.   On: 06/06/2019 22:08    Scheduled Meds: . aspirin  81 mg Oral Daily  . budesonide (PULMICORT) nebulizer solution  0.5 mg Nebulization BID  . buPROPion  150 mg Oral Daily  . clonazePAM  0.5 mg Oral TID  . diclofenac sodium  2 g Topical QID  . enoxaparin (LOVENOX) injection  70 mg Subcutaneous Q12H  . fluticasone  2 spray Each Nare Daily  . gabapentin  600 mg Oral TID  . ipratropium-albuterol  3 mL  Nebulization Q6H  . lurasidone  40 mg Oral Daily  . pantoprazole  40 mg Oral Daily  . QUEtiapine  25 mg Oral BID  . rOPINIRole  0.5 mg Oral QHS  . sodium chloride flush  3 mL Intravenous Q12H  . topiramate  200 mg Oral BID  . trazodone  300 mg Oral QHS  . vortioxetine HBr  20 mg Oral Daily  . zolpidem  5 mg Oral QHS   Continuous Infusions: . sodium chloride 100 mL/hr at 06/07/19 0311  . metronidazole 500 mg (06/07/19 1343)    Assessment/Plan:  1. Hypotension  with orthostatic hypotension just with sitting.  IV fluid hydration.  Hold Zanaflex 2. Bradycardia on presentation.  Hold Inderal. 3. Nausea, vomiting, abdominal pain, draining abdominal wounds, diarrhea.  Send off stool studies.  Supportive care with IV fluids and as needed nausea medications.  ER physician gave antibiotics but I will flipped over to Flagyl since I am not seeing an intra-abdominal infection and could potentially be fistula with the drainage.  Wondering if this could be withdrawal type reaction being off her meds for a week. 4. 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  The clinic that she told me she goes to his [facility] recovery.  The pharmacist called over there and she has not been getting her methadone there.  I will not prescribe methadone at this point. 5. Chronic pain on oxycodone 6. Anxiety, depression on numerous psychiatric medications 7. Weakness we will get physical therapy evaluation 8. Of note urine toxicology negative for opiates, methadone and benzodiazepine.  Code Status:     Code Status Orders  (From admission, onward)         Start     Ordered   06/07/19 0112  Full code  Continuous     06/07/19 0118        Code Status History    Date Active Date Inactive Code Status Order ID Comments User Context   01/31/2019 1919 02/01/2019 0441 Full Code [id number]  Athena Masse, MD ED   01/08/2019 1733 01/09/2019 1726 Full Code [id number]  Fritzi Mandes, MD ED   01/08/2019 0526 01/08/2019 1733 Full Code [id number]  Awilda Bill, NP ED   11/25/2018 1702 11/26/2018 2108 Full Code [id number]  Demetrios Loll, MD Inpatient   03/12/2016 0541 03/13/2016 2238 Full Code [id number]  [place], [place], DO Inpatient   09/30/2014 2057 10/03/2014 1830 Full Code [id number]  Hower, Aaron Mose, MD ED   04/27/2013 0200 04/29/2013 1955 Full Code [id number]  Bynum Bellows, MD Inpatient   Advance Care Planning Activity     Family Communication: Permission to speak in front of person at the  bedside Disposition Plan: Patient converted to inpatient with hypotension and orthostatic hypotension.  Downgrade to regular medical bed from progressive care bed.  Reevaluate on a daily basis on when patient is doing better.  She must be able to tolerate solid food.  She must be able to walk and not be orthostatic.  Antibiotics:  Flagyl  Time spent: 35 minutes, case discussed on 3 separate occasions with the pharmacist.  Case also discussed with nursing staff.  [place]  [facility] [employer]

## 2019-06-07 NOTE — ED Notes (Signed)
MD Wieting replied back to message about pt diet order. Jeannette,RN made aware that per MD, pt diet order could be changed to a regular diet.

## 2019-06-07 NOTE — ED Notes (Signed)
First attempt to give report, nurse unable to take at present time will call back for report.

## 2019-06-07 NOTE — ED Notes (Signed)
Ready bed @ [id number], patient going to room 114.
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
## 2019-06-07 NOTE — ED Notes (Signed)
Patient persistent to take a bath. [place] and supplies provided. Patient advised not to get out of bed w/o assist. Patient found at sink trying to wash her hail. pulled out both IV lines. IV team consult placed for IVF maintenance.  Meds given as scheduled. Dinner tray provided.

## 2019-06-07 NOTE — ED Notes (Signed)
Pharmacy messaged to verify and send needed medications

## 2019-06-07 NOTE — ED Notes (Signed)
Pt given graham crackers and peanut butter.

## 2019-06-07 NOTE — ED Notes (Signed)
Pt assisted with bedpan for urination. Pt requesting purewick placed after this urination.

## 2019-06-08 ENCOUNTER — Inpatient Hospital Stay (HOSPITAL_COMMUNITY)
Admit: 2019-06-08 | Discharge: 2019-06-08 | Disposition: A | Discharge status: Home / Self Care | Payer: Medicaid Other | Attending: Internal Medicine | Admitting: Internal Medicine

## 2019-06-08 DIAGNOSIS — R1033 Periumbilical pain: Secondary | ICD-10-CM

## 2019-06-08 DIAGNOSIS — R55 Syncope and collapse: Secondary | ICD-10-CM | POA: Diagnosis not present

## 2019-06-08 LAB — URINE CULTURE

## 2019-06-08 LAB — ECHOCARDIOGRAM COMPLETE
Height: 65 in
Weight: 2400 oz

## 2019-06-08 LAB — GLUCOSE, CAPILLARY: Glucose-Capillary: 84 mg/dL (ref 70–99)

## 2019-06-08 MED ORDER — METOPROLOL TARTRATE 5 MG/5ML IV SOLN: 2.5000 mg | INTRAVENOUS | Status: DC | PRN

## 2019-06-08 MED ORDER — MIDODRINE HCL 5 MG PO TABS
10.0000 mg | ORAL_TABLET | Freq: Three times a day (TID) | ORAL | Status: DC
  Administered 2019-06-08 – 2019-06-14 (×19): 10 mg via ORAL
  Filled 2019-06-08 (×19): qty 2

## 2019-06-08 MED ORDER — OXYCODONE HCL 5 MG PO TABS
10.0000 mg | ORAL_TABLET | ORAL | Status: DC | PRN
  Administered 2019-06-11 – 2019-06-14 (×2): 10 mg via ORAL
  Filled 2019-06-08 (×2): qty 2

## 2019-06-08 MED ORDER — SODIUM CHLORIDE 0.9 % IV BOLUS
250.0000 mL | Freq: Once | INTRAVENOUS | Status: AC
  Administered 2019-06-08: 08:00:00 250 mL via INTRAVENOUS

## 2019-06-08 MED ORDER — MIDODRINE HCL 5 MG PO TABS
5.0000 mg | ORAL_TABLET | Freq: Three times a day (TID) | ORAL | Status: DC
  Administered 2019-06-08: 14:00:00 5 mg via ORAL
  Filled 2019-06-08: qty 1

## 2019-06-08 MED ORDER — SODIUM CHLORIDE 0.9 % IV BOLUS
1000.0000 mL | Freq: Once | INTRAVENOUS | Status: AC
  Administered 2019-06-08: 1000 mL via INTRAVENOUS

## 2019-06-08 MED ORDER — IPRATROPIUM-ALBUTEROL 0.5-2.5 (3) MG/3ML IN SOLN
3.0000 mL | Freq: Three times a day (TID) | RESPIRATORY_TRACT | Status: DC
  Administered 2019-06-08 – 2019-06-09 (×2): 3 mL via RESPIRATORY_TRACT
  Filled 2019-06-08 (×2): qty 3

## 2019-06-08 MED ORDER — ADULT MULTIVITAMIN W/MINERALS CH
1.0000 | ORAL_TABLET | Freq: Every day | ORAL | Status: DC
  Administered 2019-06-09 – 2019-06-14 (×6): 1 via ORAL
  Filled 2019-06-08 (×6): qty 1

## 2019-06-08 NOTE — Progress Notes (Signed)
MD confirmed to hold 1600 dose of gabepentin and Klonipin.

## 2019-06-08 NOTE — Progress Notes (Signed)
   06/08/19 1351  Assess: MEWS Score  Temp 98.2 F (36.8 C)  BP (!) 76/33  Pulse Rate 74  Resp 14  Assess: MEWS Score  MEWS Temp 0  MEWS Systolic 2  MEWS Pulse 0  MEWS RR 0  MEWS LOC 0  MEWS Score 2  MEWS Score Color Yellow  Assess: if the MEWS score is Yellow or Red  Were vital signs taken at a resting state? Yes  Focused Assessment Documented focused assessment  Early Detection of Sepsis Score *See Row Information* Low  MEWS guidelines implemented *See Row Information* Yes  Treat  MEWS Interventions Administered scheduled meds/treatments;Escalated (See documentation below) (notified MD and charge)  Take Vital Signs  Increase Vital Sign Frequency  Yellow: Q 2hr X 2 then Q 4hr X 2, if remains yellow, continue Q 4hrs  Escalate  MEWS: Escalate Yellow: discuss with charge nurse/RN and consider discussing with provider and RRT  Notify: Charge Nurse/RN  Name of Charge Nurse/RN Notified alex, rn  Date Charge Nurse/RN Notified 06/08/19  Time Charge Nurse/RN Notified 1351  Notify: Provider  Provider Name/Title Dr. Earleen Newport  Date Provider Notified 06/08/19  Time Provider Notified 1351  Notification Type Page  Notification Reason Change in status  Response See new orders  Date of Provider Response 06/08/19  Time of Provider Response 1500  Document  Patient Outcome Not stable and remains on department  Progress note created (see row info) Yes

## 2019-06-08 NOTE — Progress Notes (Signed)
   06/08/19 0806  Assess: MEWS Score  Temp 98.2 F (36.8 C)  BP (!) 74/44  Pulse Rate 61  Resp 12  SpO2 98 %  O2 Device Room Air  Assess: MEWS Score  MEWS Temp 0  MEWS Systolic 2  MEWS Pulse 0  MEWS RR 1  MEWS LOC 0  MEWS Score 3  MEWS Score Color Yellow  Assess: if the MEWS score is Yellow or Red  Were vital signs taken at a resting state? Yes  Focused Assessment Documented focused assessment  Early Detection of Sepsis Score *See Row Information* Low  MEWS guidelines implemented *See Row Information* Yes  Treat  MEWS Interventions Administered scheduled meds/treatments;Administered prn meds/treatments;Escalated (See documentation below);Other (Comment) (see note)  Take Vital Signs  Increase Vital Sign Frequency  Yellow: Q 2hr X 2 then Q 4hr X 2, if remains yellow, continue Q 4hrs  Escalate  MEWS: Escalate Yellow: discuss with charge nurse/RN and consider discussing with provider and RRT  Notify: Charge Nurse/RN  Name of Charge Nurse/RN Notified Alex, RN  Date Charge Nurse/RN Notified 06/08/19  Time Charge Nurse/RN Notified 0815  Notify: Provider  Provider Name/Title Dr. Earleen Newport  Date Provider Notified 06/08/19  Time Provider Notified [phone]  Notification Type Page  Notification Reason Change in status  Response See new orders;Other (Comment) (MD came to bedside)  Date of Provider Response 06/08/19  Time of Provider Response 0820  Notify: Rapid Response  Name of Rapid Response RN Notified Brandi, RN  Date Rapid Response Notified 06/08/19  Time Rapid Response Notified 0830  Document  Patient Outcome Stabilized after interventions  Progress note created (see row info) Yes

## 2019-06-08 NOTE — Progress Notes (Addendum)
Alerted Dr. Earleen Newport that patient's BP is 74/44. Alerted Dr. Earleen Newport that patient is dizzy. Patient is drowsy and asleep, although easily aroused and mentation is normal when awake. RR is also low, from 8-12. Reminded MD that patient has a history of CHF. Md acknowledges history and orders 250 ML NS bolus. Also requested that MD look at patient's medications, as many of them could contribute to low BP. Charge nurse at bedside agrees with plan of care. Velna Hatchet, SWOT nurse also at bedside, agrees with plan of care.   At 0839, patient's BP is not improved after bolus, 70/48. Dr. Earleen Newport notified who stated he was on his way to see patient. Patient's mentation is still normal, although still drowsy. MD at bedside. Md believes patient to be stable and will keep on floor for now and do a 1L bolus of NS. Will continue to monitor patient closely. Frequent vital signs initiated.

## 2019-06-08 NOTE — Progress Notes (Addendum)
Alerted Dr. Earleen Newport that patient's BP is still soft. Has Seroquil, Trintellix, latuda, klonipin, and topamax due this AM. Already gave gabepentin and wellbutrin.   MD responded to give klonipin and trintellix this AM, give latuda later (rescheduled for 12) and hold seroquel and topamax. Verified these orders with MD. Medications given late due to ensure BP is stable.   MD also ordered to dc enteric precautions.

## 2019-06-08 NOTE — Progress Notes (Signed)
Patient ID: Leslie Marks, female   DOB: 09-Jul-1970, 49 y.o.   MRN: [id number] [facility] PROGRESS NOTE  Leslie Marks [id number] DOB: Jul 04, 1970 DOA: 06/06/2019 PCP: Patient, No Pcp Per  HPI/Subjective: Patient still having abdominal pain.  Been having some episodes of low blood pressure.  Still having some drainage from her abdomen.  Still has some nasal congestion.  Objective: Vitals:   06/08/19 1515 06/08/19 1552  BP: (!) 83/40 (!) 99/55  Pulse: 72 61  Resp:  16  Temp:  97.7 F (36.5 C)  SpO2:  99%    Intake/Output Summary (Last 24 hours) at 06/08/2019 1650 Last data filed at 06/08/2019 1500 Gross per 24 hour  Intake 4446.19 ml  Output --  Net 4446.19 ml   Filed Weights   06/06/19 2119  Weight: 68 kg    ROS: Review of Systems  Constitutional: Negative for chills and fever.  HENT: Positive for congestion.   Eyes: Negative for blurred vision.  Respiratory: Negative for cough and shortness of breath.   Cardiovascular: Negative for chest pain and palpitations.  Gastrointestinal: Positive for abdominal pain and nausea. Negative for constipation, diarrhea and vomiting.  Genitourinary: Negative for dysuria.  Musculoskeletal: Negative for joint pain.  Neurological: Negative for dizziness.   Exam: Physical Exam  Constitutional: She is oriented to person, place, and time.  HENT:  Nose: No mucosal edema.  Mouth/Throat: No oropharyngeal exudate or posterior oropharyngeal edema.  Eyes: Conjunctivae and lids are normal.  Neck: Carotid bruit is not present.  Cardiovascular: S1 normal and S2 normal. Exam reveals no gallop.  Murmur heard.  Systolic murmur is present with a grade of 2/6. Respiratory: No respiratory distress. She has decreased breath sounds in the right lower field and the left lower field. She has wheezes in the right lower field and the left lower field. She has no rhonchi. She has no rales.  GI: Soft. Bowel sounds are normal. There is abdominal  tenderness.  2 areas that are draining in the abdomen  Musculoskeletal:     Right ankle: No swelling.     Left ankle: No swelling.  Lymphadenopathy:    She has no cervical adenopathy.  Neurological: She is alert and oriented to person, place, and time. No cranial nerve deficit.  Skin: Skin is warm. Nails show no clubbing.  Irritation under nose 2 areas that are draining in the abdomen  Psychiatric: She has a normal mood and affect.      Data Reviewed: Basic Metabolic Panel: Recent Labs  Lab 06/06/19 2131 06/06/19 2306  NA 137  --   K 3.8  --   CL 110  --   CO2 23  --   GLUCOSE 111*  --   BUN 13  --   CREATININE 0.97  --   CALCIUM 8.4*  --   MG  --  1.9   Liver Function Tests: Recent Labs  Lab 06/06/19 2131  AST 19  ALT 23  ALKPHOS 84  BILITOT 0.4  PROT 6.1*  ALBUMIN 3.4*   CBC: Recent Labs  Lab 06/06/19 2131  WBC 4.8  NEUTROABS 2.8  HGB 11.3*  HCT 35.9*  MCV 90.7  PLT 179    CBG: Recent Labs  Lab 06/07/19 0452 06/08/19 1133  GLUCAP 96 84    Recent Results (from the past 240 hour(s))  Blood culture (routine x 2)     Status: None (Preliminary result)   Collection Time: 06/06/19 10:49 PM   Specimen: BLOOD  Result Value Ref Range Status   Specimen Description BLOOD LEFT WRIST  Final   Special Requests   Final    BOTTLES DRAWN AEROBIC AND ANAEROBIC Blood Culture results may not be optimal due to an inadequate volume of blood received in culture bottles   Culture   Final    NO GROWTH 2 DAYS Performed at [facility], [address], [place], [place] [address]    Report Status PENDING  Incomplete  Urine culture     Status: Abnormal   Collection Time: 06/06/19 11:06 PM   Specimen: In/Out Cath Urine  Result Value Ref Range Status   Specimen Description   Final    IN/OUT CATH URINE Performed at [facility], [address] [place], [place] [address]    Special Requests   Final    NONE Performed at [facility],  [address] [place], [place] [address]    Culture MULTIPLE SPECIES PRESENT, SUGGEST RECOLLECTION (A)  Final   Report Status 06/08/2019 FINAL  Final  Blood culture (routine x 2)     Status: None (Preliminary result)   Collection Time: 06/07/19  7:31 AM   Specimen: BLOOD  Result Value Ref Range Status   Specimen Description BLOOD UPPER RT ARM  Final   Special Requests   Final    BOTTLES DRAWN AEROBIC ONLY Blood Culture adequate volume   Culture   Final    NO GROWTH 1 DAY Performed at [facility], [address] [place], [place] [address]    Report Status PENDING  Incomplete  SARS CORONAVIRUS 2 (TAT 6-24 HRS) Nasopharyngeal Nasopharyngeal Swab     Status: None   Collection Time: 06/07/19  7:58 AM   Specimen: Nasopharyngeal Swab  Result Value Ref Range Status   SARS Coronavirus 2 NEGATIVE NEGATIVE Final    Comment: (NOTE) SARS-CoV-2 target nucleic acids are NOT DETECTED. The SARS-CoV-2 RNA is generally detectable in upper and lower respiratory specimens during the acute phase of infection. Negative results do not preclude SARS-CoV-2 infection, do not rule out co-infections with other pathogens, and should not be used as the sole basis for treatment or other patient management decisions. Negative results must be combined with clinical observations, patient history, and epidemiological information. The expected result is Negative. Fact Sheet for Patients: SugarRoll.be Fact Sheet for Healthcare Providers: https://www.woods-mathews.com/ This test is not yet approved or cleared by the [place] FDA and  has been authorized for detection and/or diagnosis of SARS-CoV-2 by FDA under an Emergency Use Authorization (EUA). This EUA will remain  in effect (meaning this test can be used) for the duration of the COVID-19 declaration under Section 56 4(b)(1) of the Act, 21 U.S.C. section 360bbb-3(b)(1), unless the authorization is  terminated or revoked sooner. Performed at [facility], [place] [address] [place], [place] [address]      Studies: CT Angio Chest PE W and/or Wo Contrast  Result Date: 06/07/2019 CLINICAL DATA:  History of pulmonary embolism. Prior gastric bypass with abdominal fistula. Presenting with draining fistula and chest pain. EXAM: CT ANGIOGRAPHY CHEST CT ABDOMEN AND PELVIS WITH CONTRAST TECHNIQUE: Multidetector CT imaging of the chest was performed using the standard protocol during bolus administration of intravenous contrast. Multiplanar CT image reconstructions and MIPs were obtained to evaluate the vascular anatomy. Multidetector CT imaging of the abdomen and pelvis was performed using the standard protocol during bolus administration of intravenous contrast. CONTRAST:  164mL OMNIPAQUE IOHEXOL 350 MG/ML SOLN COMPARISON:  None.  FINDINGS: CTA CHEST FINDINGS Cardiovascular: Contrast injection is sufficient to demonstrate satisfactory opacification of the pulmonary arteries to the segmental level. There is no pulmonary embolus. The main pulmonary artery is within normal limits for size. There is no CT evidence of acute right heart strain. The visualized aorta is normal. There is a normal 3-vessel arch branching pattern. Heart size is normal, without pericardial effusion. Mediastinum/Nodes: No mediastinal, hilar or axillary lymphadenopathy. The visualized thyroid and thoracic esophageal course are unremarkable. Lungs/Pleura: No pulmonary nodules or masses. No pleural effusion or pneumothorax. No focal airspace consolidation. No focal pleural abnormality. Musculoskeletal: No chest wall abnormality. No acute or significant osseous findings. Review of the MIP images confirms the above findings. CT ABDOMEN and PELVIS FINDINGS Hepatobiliary: Normal hepatic contours and density. Unchanged chronic biliary dilatation. Status post cholecystectomy. Pancreas: Normal contours without ductal dilatation. No peripancreatic  fluid collection. Spleen: Normal. Adrenals/Urinary Tract: --Adrenal glands: Normal. --Right kidney/ureter: No hydronephrosis or perinephric stranding. No nephrolithiasis. No obstructing ureteral stones. --Left kidney/ureter: No hydronephrosis or perinephric stranding. No nephrolithiasis. No obstructing ureteral stones. --Urinary bladder: Unremarkable. Stomach/Bowel: --Stomach/Duodenum: Status post gastric bypass. --Small bowel: No dilatation or inflammation. --Colon: No focal abnormality. --Appendix: Normal. Vascular/Lymphatic: Unchanged appearance of IVC filter. No abdominal or pelvic lymphadenopathy. Reproductive: Status post hysterectomy. No adnexal mass. Musculoskeletal. No bony spinal canal stenosis or focal osseous abnormality. Other: Lower midline ventral abdominal soft tissue wound, unchanged. No abscess or drainable fluid collection. IMPRESSION: 1. No pulmonary embolus or acute aortic syndrome. 2. No acute abnormality of the chest, abdomen or pelvis. 3. Lower midline ventral abdominal wound, unchanged and likely the site of the reported draining fistula. No abscess or fluid collection. 4. Unchanged appearance of IVC filter with limbs protruding beyond the lumen of the vessel. Electronically Signed   By: Ulyses Jarred M.D.   On: 06/07/2019 00:37   CT ABDOMEN PELVIS W CONTRAST  Result Date: 06/07/2019 CLINICAL DATA:  History of pulmonary embolism. Prior gastric bypass with abdominal fistula. Presenting with draining fistula and chest pain. EXAM: CT ANGIOGRAPHY CHEST CT ABDOMEN AND PELVIS WITH CONTRAST TECHNIQUE: Multidetector CT imaging of the chest was performed using the standard protocol during bolus administration of intravenous contrast. Multiplanar CT image reconstructions and MIPs were obtained to evaluate the vascular anatomy. Multidetector CT imaging of the abdomen and pelvis was performed using the standard protocol during bolus administration of intravenous contrast. CONTRAST:  139mL OMNIPAQUE  IOHEXOL 350 MG/ML SOLN COMPARISON:  None. FINDINGS: CTA CHEST FINDINGS Cardiovascular: Contrast injection is sufficient to demonstrate satisfactory opacification of the pulmonary arteries to the segmental level. There is no pulmonary embolus. The main pulmonary artery is within normal limits for size. There is no CT evidence of acute right heart strain. The visualized aorta is normal. There is a normal 3-vessel arch branching pattern. Heart size is normal, without pericardial effusion. Mediastinum/Nodes: No mediastinal, hilar or axillary lymphadenopathy. The visualized thyroid and thoracic esophageal course are unremarkable. Lungs/Pleura: No pulmonary nodules or masses. No pleural effusion or pneumothorax. No focal airspace consolidation. No focal pleural abnormality. Musculoskeletal: No chest wall abnormality. No acute or significant osseous findings. Review of the MIP images confirms the above findings. CT ABDOMEN and PELVIS FINDINGS Hepatobiliary: Normal hepatic contours and density. Unchanged chronic biliary dilatation. Status post cholecystectomy. Pancreas: Normal contours without ductal dilatation. No peripancreatic fluid collection. Spleen: Normal. Adrenals/Urinary Tract: --Adrenal glands: Normal. --Right kidney/ureter: No hydronephrosis or perinephric stranding. No nephrolithiasis. No obstructing ureteral stones. --Left kidney/ureter: No hydronephrosis or perinephric stranding. No nephrolithiasis. No  obstructing ureteral stones. --Urinary bladder: Unremarkable. Stomach/Bowel: --Stomach/Duodenum: Status post gastric bypass. --Small bowel: No dilatation or inflammation. --Colon: No focal abnormality. --Appendix: Normal. Vascular/Lymphatic: Unchanged appearance of IVC filter. No abdominal or pelvic lymphadenopathy. Reproductive: Status post hysterectomy. No adnexal mass. Musculoskeletal. No bony spinal canal stenosis or focal osseous abnormality. Other: Lower midline ventral abdominal soft tissue wound,  unchanged. No abscess or drainable fluid collection. IMPRESSION: 1. No pulmonary embolus or acute aortic syndrome. 2. No acute abnormality of the chest, abdomen or pelvis. 3. Lower midline ventral abdominal wound, unchanged and likely the site of the reported draining fistula. No abscess or fluid collection. 4. Unchanged appearance of IVC filter with limbs protruding beyond the lumen of the vessel. Electronically Signed   By: Ulyses Jarred M.D.   On: 06/07/2019 00:37   DG Chest Portable 1 View  Result Date: 06/06/2019 CLINICAL DATA:  49 year old female with chest pain. EXAM: PORTABLE CHEST 1 VIEW COMPARISON:  Chest radiograph dated 01/31/2019. FINDINGS: The heart size and mediastinal contours are within normal limits. Both lungs are clear. The visualized skeletal structures are unremarkable. IMPRESSION: No active disease. Electronically Signed   By: Anner Crete M.D.   On: 06/06/2019 22:08   ECHOCARDIOGRAM COMPLETE  Result Date: 06/08/2019    ECHOCARDIOGRAM REPORT   Patient Name:   Leslie Marks Date of Exam: 06/08/2019 Medical Rec #:  [id number]       Height:       65.0 in Accession #:    [id number]      Weight:       150.0 lb Date of Birth:  1970-05-19      BSA:          1.750 m Patient Age:    12 years        BP:           88/62 mmHg Patient Gender: F               HR:           62 bpm. Exam Location:  [facility] Procedure: 2D Echo, Cardiac Doppler and Color Doppler Indications:     Syncope 780.2  History:         Patient has no prior history of Echocardiogram examinations.                  Stroke. DVT.  Sonographer:     Sherrie Sport RDCS (AE) Referring Phys:  [id number] Athena Masse Diagnosing Phys: Nelva Bush MD  Sonographer Comments: Suboptimal apical window. IMPRESSIONS  1. Left ventricular ejection fraction, by estimation, is 55 to 60%. The left ventricle has normal function. Left ventricular endocardial border not optimally defined to evaluate regional wall motion. Left ventricular diastolic  parameters were normal.  2. Right ventricular systolic function is normal. The right ventricular size is normal. Tricuspid regurgitation signal is inadequate for assessing PA pressure.  3. The mitral valve was not well visualized. Trivial mitral valve regurgitation.  4. The aortic valve is tricuspid. Aortic valve regurgitation is not visualized. No aortic stenosis is present. FINDINGS  Left Ventricle: Left ventricular ejection fraction, by estimation, is 55 to 60%. The left ventricle has normal function. Left ventricular endocardial border not optimally defined to evaluate regional wall motion. The left ventricular internal cavity size was normal in size. There is borderline left ventricular hypertrophy. Left ventricular diastolic parameters were normal. Right Ventricle: The right ventricular size is normal. No increase in right ventricular wall thickness. Right ventricular systolic function  is normal. Tricuspid regurgitation signal is inadequate for assessing PA pressure. Left Atrium: Left atrial size was normal in size. Right Atrium: Right atrial size was normal in size. Pericardium: The pericardium was not well visualized. Mitral Valve: The mitral valve was not well visualized. There is mild thickening of the mitral valve leaflet(s). There is mild calcification of the mitral valve leaflet(s). Trivial mitral valve regurgitation. Tricuspid Valve: The tricuspid valve is grossly normal. Tricuspid valve regurgitation is not demonstrated. Aortic Valve: The aortic valve is tricuspid. Aortic valve regurgitation is not visualized. No aortic stenosis is present. Aortic valve mean gradient measures 3.0 mmHg. Aortic valve peak gradient measures 5.0 mmHg. Aortic valve area, by VTI measures 2.49 cm. Pulmonic Valve: The pulmonic valve was not well visualized. Pulmonic valve regurgitation is not visualized. No evidence of pulmonic stenosis. Aorta: The aortic root is normal in size and structure. Pulmonary Artery: The pulmonary  artery is not well seen. IAS/Shunts: The interatrial septum was not well visualized.  LEFT VENTRICLE PLAX 2D LVIDd:         4.66 cm  Diastology LVIDs:         3.06 cm  LV e' lateral:   12.40 cm/s LV PW:         1.02 cm  LV E/e' lateral: 7.0 LV IVS:        0.97 cm  LV e' medial:    10.00 cm/s LVOT diam:     2.00 cm  LV E/e' medial:  8.7 LV SV:         63 LV SV Index:   36 LVOT Area:     3.14 cm  RIGHT VENTRICLE RV Basal diam:  3.17 cm RV S prime:     11.50 cm/s TAPSE (M-mode): 3.6 cm LEFT ATRIUM             Index       RIGHT ATRIUM           Index LA diam:        3.60 cm 2.06 cm/m  RA Area:     17.50 cm LA Vol (A2C):   58.5 ml 33.42 ml/m RA Volume:   47.50 ml  27.14 ml/m LA Vol (A4C):   45.5 ml 25.99 ml/m LA Biplane Vol: 52.6 ml 30.05 ml/m  AORTIC VALVE                   PULMONIC VALVE AV Area (Vmax):    2.52 cm    PV Vmax:        0.63 m/s AV Area (Vmean):   2.26 cm    PV Peak grad:   1.6 mmHg AV Area (VTI):     2.49 cm    RVOT Peak grad: 3 mmHg AV Vmax:           111.50 cm/s AV Vmean:          76.950 cm/s AV VTI:            0.252 m AV Peak Grad:      5.0 mmHg AV Mean Grad:      3.0 mmHg LVOT Vmax:         89.40 cm/s LVOT Vmean:        55.300 cm/s LVOT VTI:          0.200 m LVOT/AV VTI ratio: 0.79  AORTA Ao Root diam: 2.20 cm MITRAL VALVE MV Area (PHT): 3.77 cm    SHUNTS MV Decel Time: 201 msec  Systemic VTI:  0.20 m MV E velocity: 87.30 cm/s  Systemic Diam: 2.00 cm MV A velocity: 41.70 cm/s MV E/A ratio:  2.09 Nelva Bush MD Electronically signed by Nelva Bush MD Signature Date/Time: 06/08/2019/4:08:13 PM    Final     Scheduled Meds: . aspirin  81 mg Oral Daily  . budesonide (PULMICORT) nebulizer solution  0.5 mg Nebulization BID  . buPROPion  150 mg Oral Daily  . clonazePAM  0.5 mg Oral TID  . diclofenac sodium  2 g Topical QID  . enoxaparin (LOVENOX) injection  70 mg Subcutaneous Q12H  . fluticasone  2 spray Each Nare Daily  . gabapentin  600 mg Oral TID  . ipratropium-albuterol  3  mL Nebulization Q6H  . lurasidone  40 mg Oral Daily  . midodrine  10 mg Oral TID WC  . [START ON 06/09/2019] multivitamin with minerals  1 tablet Oral Daily  . mupirocin ointment   Topical BID  . pantoprazole  40 mg Oral Daily  . QUEtiapine  25 mg Oral BID  . rOPINIRole  0.5 mg Oral QHS  . sodium chloride flush  3 mL Intravenous Q12H  . topiramate  200 mg Oral BID  . trazodone  300 mg Oral QHS  . vortioxetine HBr  20 mg Oral Daily  . zolpidem  5 mg Oral QHS   Continuous Infusions: . sodium chloride Stopped (06/08/19 1404)  . metronidazole 500 mg (06/08/19 1404)    Assessment/Plan:  1. Hypotension with orthostatic hypotension just with sitting.  IV fluid hydration.  Hold Zanaflex and Inderal.  2 fluid boluses given today.  Started midodrine 2. Bradycardia on presentation.  Hold Inderal.  As needed IV metoprolol if heart rate goes greater than 130. 3. Nausea, vomiting, abdominal pain, draining abdominal wounds, diarrhea.  Supportive care with IV fluids and as needed nausea medications.  Continue Flagyl since I am not seeing an intra-abdominal infection and could potentially be fistula with the drainage.  Could also be withdrawal reaction because she has not taken her medications in a week 4. 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  The patient does not know the phone number of her clinic.  The pharmacist called over to the [facility] recovery clinic and she does not get her methadone there.  I told her that methadone needs to be confirmed before we can prescribe that here. 5. Chronic pain on oxycodone 6. Anxiety, depression on numerous psychiatric medications 7. Weakness we will get physical therapy evaluation 8. Of note urine toxicology negative for opiates, methadone and benzodiazepine. 9. Urine analysis negative. 10. History of stroke on chronic anticoagulation with Lovenox.  Code Status:     Code Status Orders  (From admission, onward)         Start     Ordered   06/07/19  0112  Full code  Continuous     06/07/19 0118        Code Status History    Date Active Date Inactive Code Status Order ID Comments User Context   01/31/2019 1919 02/01/2019 0441 Full Code [id number]  Athena Masse, MD ED   01/08/2019 1733 01/09/2019 1726 Full Code [id number]  Fritzi Mandes, MD ED   01/08/2019 0526 01/08/2019 1733 Full Code [id number]  Awilda Bill, NP ED   11/25/2018 1702 11/26/2018 2108 Full Code [id number]  Demetrios Loll, MD Inpatient   03/12/2016 0541 03/13/2016 2238 Full Code [id number]  [place], [place], DO Inpatient   09/30/2014 2057 10/03/2014 1830 Full Code  [id number]  Lytle Butte, MD ED   04/27/2013 0200 04/29/2013 1955 Full Code [id number]  Bynum Bellows, MD Inpatient   Advance Care Planning Activity     Disposition Plan: With blood pressure still low despite fluid boluses I started midodrine.  Blood pressure must be stabilized and she must be able to work with physical therapy in order to go home.  She must also tolerate solid food  Antibiotics:  Flagyl  Time spent: 32 minutes, case discussed with nursing staff on numerous occasions today  [place]

## 2019-06-08 NOTE — Progress Notes (Signed)
*  PRELIMINARY RESULTS* Echocardiogram 2D Echocardiogram has been performed.  Leslie Marks 06/08/2019, 10:23 AM

## 2019-06-08 NOTE — Progress Notes (Addendum)
Alerted Dr. Earleen Newport that patient's BP is 76/33 now. Patient appearance same as this AM. Drowsy but able to easily aroused. Lungs sound clear, no evidence of volume overload. Dr. Earleen Newport acknowledged notification and ordered Midronone. No other new orders. Will give and continue to monitor patient closely. Charge nurse notified as well as brandi, SWOT rnDrue Dun, Manager of unit also notified of situation, she agrees with plan of care.

## 2019-06-08 NOTE — Progress Notes (Addendum)
PT Cancellation Note  Patient Details Name: Leslie Marks MRN: [id number] DOB: 1970/04/29   Cancelled Treatment:    Reason Eval/Treat Not Completed: Other (comment). 0825: Consult received. Pt with supine BP at 77/44 at this time. Not appropriate for mobility assessment. Will hold and re-attempt when medically stable.  Addendum: [id number]: 2nd attempt. Pt still with severe hypotension with most recent BP at 76/33. Continues to be inappropriate for mobility assessment. Will hold until next date.   Lynton Crescenzo 06/08/2019, 8:25 AM Greggory Stallion, PT, DPT [phone]

## 2019-06-08 NOTE — Progress Notes (Signed)
Nutrition Brief Note  Patient identified on the Malnutrition Screening Tool (MST) Report  49 y.o. 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 Pt presents now with syncope.  Met with pt in room today. Pt reports poor appetite and oral intake for 1 week pta r/t nausea and vomiting. Pt's breakfast tray was sitting on her side table untouched; pt reports that she has just been sleeping this morning and that's why she did not eat. Pt documented to be eating 100% of her meals yesterday. Pt with h/o roux-en-y gastric bypass in 2003. Pt reports that she does not tolerate nutritional supplements and that she does not take any vitamins at home. Recommended adequate protein intake with each meal along with daily MVI as pt is at risk for nutrient deficiencies r/t her gastric bypass.   Wt Readings from Last 15 Encounters:  06/06/19 68 kg  01/31/19 74 kg  01/26/19 74.3 kg  01/13/19 69.9 kg  01/12/19 70.1 kg  01/09/19 73.1 kg  01/05/19 70.7 kg  11/25/18 72 kg  07/09/18 73.9 kg  03/18/18 72.6 kg  02/16/18 86.2 kg  01/21/18 90 kg  01/08/18 90 kg  12/23/17 83.9 kg  11/02/17 85.7 kg   Body mass index is 24.96 kg/m. Patient meets criteria for normal weight based on current BMI. Pt did not have any muscle or fat depletions on exam today. Pt reports in total that she has lost over 100lbs. Per chart, pt appears fairly weight stable pta.   Current diet order is HH, patient is consuming approximately 100% of meals at this time. RD will liberalize pt's diet as a heart healthy diet is restrictive of protein. RD will also add daily MVI.   Labs and medications  reviewed.   Pt declines nutritional interventions. If nutrition issues arise, please consult RD.   Koleen Distance MS, RD, LDN Please refer to [facility] for RD and/or RD on-call/weekend/after hours pager

## 2019-06-09 LAB — CBC
HCT: 29.2 % — ABNORMAL LOW (ref 36.0–46.0)
Hemoglobin: 9.8 g/dL — ABNORMAL LOW (ref 12.0–15.0)
MCH: 29 pg (ref 26.0–34.0)
MCHC: 33.6 g/dL (ref 30.0–36.0)
MCV: 86.4 fL (ref 80.0–100.0)
Platelets: UNDETERMINED 10*3/uL (ref 150–400)
RBC: 3.38 MIL/uL — ABNORMAL LOW (ref 3.87–5.11)
RDW: 15.3 % (ref 11.5–15.5)
WBC: 2.1 10*3/uL — ABNORMAL LOW (ref 4.0–10.5)
nRBC: 0 % (ref 0.0–0.2)

## 2019-06-09 LAB — BASIC METABOLIC PANEL
Anion gap: 5 (ref 5–15)
BUN: 6 mg/dL (ref 6–20)
CO2: 24 mmol/L (ref 22–32)
Calcium: 8.2 mg/dL — ABNORMAL LOW (ref 8.9–10.3)
Chloride: 110 mmol/L (ref 98–111)
Creatinine, Ser: 0.6 mg/dL (ref 0.44–1.00)
GFR calc Af Amer: >60 mL/min (ref >60)
GFR calc non Af Amer: >60 mL/min (ref >60)
Glucose, Bld: 83 mg/dL (ref 70–99)
Potassium: 4.1 mmol/L (ref 3.5–5.1)
Sodium: 139 mmol/L (ref 135–145)

## 2019-06-09 LAB — MAGNESIUM: Magnesium: 2 mg/dL (ref 1.7–2.4)

## 2019-06-09 LAB — GLUCOSE, CAPILLARY: Glucose-Capillary: 85 mg/dL (ref 70–99)

## 2019-06-09 LAB — PHOSPHORUS: Phosphorus: 3.7 mg/dL (ref 2.5–4.6)

## 2019-06-09 MED ORDER — ENOXAPARIN SODIUM 80 MG/0.8ML ~~LOC~~ SOLN
65.0000 mg | Freq: Two times a day (BID) | SUBCUTANEOUS | Status: DC
  Administered 2019-06-09 – 2019-06-11 (×4): 65 mg via SUBCUTANEOUS
  Filled 2019-06-09 (×5): qty 0.8

## 2019-06-09 MED ORDER — IPRATROPIUM-ALBUTEROL 0.5-2.5 (3) MG/3ML IN SOLN
3.0000 mL | Freq: Two times a day (BID) | RESPIRATORY_TRACT | Status: DC
  Filled 2019-06-09 (×2): qty 3

## 2019-06-09 NOTE — TOC Initial Note (Signed)
Transition of Care [facility]) - Initial/Assessment Note    Patient Details  Name: Leslie Marks MRN: [id number] Date of Birth: 21-May-1970  [facility]) CM/SW Contact:    Shelbie Hutching, RN Phone Number: 06/09/2019, 2:28 PM  Clinical Narrative:                 RNCM introduced self to patient and explained role in discharge planning, patient voices understanding.  Readmission screen completed.  Patient is not feeling well, nauseous, but agrees to answer quick questions.  Patient lives with her boyfriend and is independent in ADL's, patient does not drive but her boyfriend provides transportation.  Patient is current with PCP, reports last visit about a month ago at [facility] in [place].  Patient denies issues obtaining medications.   No discharge needs have been identified at this time.     Expected Discharge Plan: Home/Self Care Barriers to Discharge: Continued Medical Work up   Patient Goals and CMS Choice Patient states their goals for this hospitalization and ongoing recovery are:: to feel better      Expected Discharge Plan and Services Expected Discharge Plan: Home/Self Care       Living arrangements for the past 2 months: Single Family Home                                      Prior Living Arrangements/Services Living arrangements for the past 2 months: Single Family Home Lives with:: Significant Other Patient language and need for interpreter reviewed:: Yes Do you feel safe going back to the place where you live?: Yes      Need for Family Participation in Patient Care: Yes (Comment)(multiple medical comorbidities) Care giver support system in place?: Yes (comment)(boyfriend) Current home services: DME(walker, bedside commode, cane) Criminal Activity/Legal Involvement Pertinent to Current Situation/Hospitalization: No - Comment as needed  Activities of Daily Living Home Assistive Devices/Equipment: Environmental consultant (specify type), Cane (specify quad or  straight) ADL Screening (condition at time of admission) Patient's cognitive ability adequate to safely complete daily activities?: Yes Is the patient deaf or have difficulty hearing?: No Does the patient have difficulty seeing, even when wearing glasses/contacts?: No Does the patient have difficulty concentrating, remembering, or making decisions?: No Patient able to express need for assistance with ADLs?: Yes Does the patient have difficulty dressing or bathing?: Yes Independently performs ADLs?: Yes (appropriate for developmental age) Does the patient have difficulty walking or climbing stairs?: Yes Weakness of Legs: Both Weakness of Arms/Hands: None  Permission Sought/Granted   Permission granted to share information with : No              Emotional Assessment Appearance:: Appears stated age Attitude/Demeanor/Rapport: Engaged Affect (typically observed): Flat, Frustrated, Blunt Orientation: : Oriented to Self, Oriented to Place, Oriented to  Time, Oriented to Situation Alcohol / Substance Use: Not Applicable Psych Involvement: No (comment)  Admission diagnosis:  Syncope and collapse [R55] Hypotension [I95.9] Hypotension, unspecified hypotension type [I95.9] Syncope, unspecified syncope type [R55] Patient Active Problem List   Diagnosis Date Noted  . Periumbilical abdominal pain   . Bradycardia 06/07/2019  . Polypharmacy 06/07/2019  . Nausea and vomiting   . Trimalleolar fracture, right, closed, initial encounter 01/31/2019  . Preoperative clearance 01/31/2019  . Hemiparesis affecting right side as late effect of cerebrovascular accident (CVA) ([place]) 01/31/2019  . Chronic wound infection of abdomen 01/31/2019  . Complications of gastric bypass  surgery 01/31/2019  . Syncope and collapse 01/31/2019  . Loss of weight   . Chronic diarrhea   . Hypotension   . Methadone overdose ([place]) 01/08/2019  . Other chronic pain 01/08/2019  . Hypokalemia   . Drug overdose  11/25/2018  . Hyperglycemia 06/25/2018  . 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 History of pulmonary embolism 06/17/2018  . Partial paralysis of right hand ([place]) 01/08/2017  . History of drug overdose 03/12/2016  . History of migraine headaches 10/06/2015  . 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 Abnormal LFTs (liver function tests) 11/20/2014  . Mild intermittent asthma 11/20/2014  . Transverse myelitis ([place]) 03/09/2014  . Atherosclerotic heart disease of native coronary artery without angina pectoris 11/20/2013  . 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 Malabsorption syndrome 05/27/2013  . Lupus anticoagulant positive 04/29/2013  . Chronic anxiety 04/27/2013  . Hypoglycemia without diagnosis of diabetes mellitus 04/27/2013  . Anxiety and depression 04/26/2013  . Chronic anticoagulation 04/26/2013  . Pancytopenia ([place]) 04/26/2013  . Personal history of other venous thrombosis and embolism 03/17/2013  . Bipolar I disorder ([place]) 12/25/2011  . B12 deficiency 11/16/2010  . Iron deficiency anemia 09/25/2010  . S/P insertion of IVC (inferior vena caval) filter 09/25/2010  . Borderline personality disorder ([place]) 09/24/2010  . Low back pain 09/24/2010   PCP:  Patient, No Pcp Per Pharmacy:   [place] [place] [address] Phone: [phone] Fax: [phone]     Social Determinants of Health ([place]) Interventions    Readmission Risk Interventions Readmission Risk Prevention Plan 06/09/2019 11/26/2018  Transportation Screening Complete Complete  PCP or Specialist Appt within 3-5 Days - Complete  [place] or [place] - Complete  Social Work Consult for [place] Planning/Counseling - Complete  Palliative Care Screening - Not Applicable  Medication Review Press photographer) Complete Complete  PCP or Specialist appointment within 3-5 days of discharge  Complete -  [place] or [place] (No Data) -  SW Recovery Care/Counseling Consult Complete -  Palliative Care Screening Not Applicable -  [place] Not Applicable -  Some recent data might be hidden
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
## 2019-06-09 NOTE — Progress Notes (Signed)
PT Cancellation Note  Patient Details Name: Leslie Marks MRN: [id number] DOB: 14-Feb-1971   Cancelled Treatment:    Reason Eval/Treat Not Completed: (Evaluation re-attempted.  Patient declines despite encouragement, reporting she "feels bad" (nauseous) and can't tolerate any mobility at this time.  Per her report, has already received meds to address; no other needs at this time.  Will continue efforts next date.)   Daphne Karrer H. Owens Shark, PT, DPT, NCS 06/09/19, 1:59 PM [phone]

## 2019-06-09 NOTE — Progress Notes (Addendum)
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 Possibly secondary to gastroparesis. Zofran, phenegran prn   Hypotension: continue on IVFs. Continue to hold propranolol.   Bradycardia: on presentation. Continue to hold propranolol. Continue on tele   Hx abd wall fistula: w/ hx of gastric bypass. Will continue on flagyl   Chronic pain: on methadone. Will need to confirmed by pharmacy before restarting  Borderline personality disorder: continue on wellbutrin, klonopin, latuda, seroquel   Hx of CVA: w/ right hemiparesis. PT/OT consulted   DVT prophylaxis: lovenox Code Status: full  Family Communication:  Disposition Plan: depends on PT/OT recs   Consultants:      Procedures:    Antimicrobials:    Subjective: Pt c/o nausea   Objective: Vitals:   06/09/19 0248 06/09/19 0500 06/09/19 0548 06/09/19 0748  BP: (!) 90/46  102/78   Pulse: 62  73   Resp: 17     Temp:      TempSrc:      SpO2:    97%  Weight:  64.3 kg    Height:        Intake/Output Summary (Last 24 hours) at 06/09/2019 0801 Last data filed at 06/09/2019 0543 Gross per 24 hour  Intake 2575.11 ml  Output 800 ml  Net 1775.11 ml   Filed Weights   06/06/19 2119 06/09/19 0500  Weight: 68 kg 64.3  kg    Examination:  General exam: Appears calm and comfortable  Respiratory system: Clear to auscultation. Respiratory effort normal. Cardiovascular system: S1 & S2 +. No  rubs, gallops or clicks.  Gastrointestinal system: Abdomen is nondistended, soft and nontender. hypoactive bowel sounds heard. Central nervous system: Alert and oriented. Moves all 4 extremities  Psychiatry: Judgement and insight appear normal. Flat mood and affect    Data Reviewed: I have personally reviewed following labs and imaging studies  CBC: Recent Labs  Lab 06/06/19 2131  WBC 4.8  NEUTROABS 2.8  HGB 11.3*  HCT 35.9*  MCV 90.7  PLT 0000000   Basic Metabolic Panel: Recent Labs  Lab 06/06/19 2131 06/06/19 2306  NA 137  --   K 3.8  --   CL 110  --   CO2 23  --   GLUCOSE 111*  --   BUN 13  --   CREATININE 0.97  --   CALCIUM 8.4*  --   MG  --  1.9   GFR: Estimated Creatinine Clearance: 65.2 mL/min (by C-G formula based on SCr of 0.97 mg/dL). Liver Function Tests: Recent Labs  Lab 06/06/19 2131  AST 19  ALT 23  ALKPHOS 84  BILITOT 0.4  PROT 6.1*  ALBUMIN 3.4*   No results for input(s): LIPASE, AMYLASE in the last 168 hours. No results for input(s):  AMMONIA in the last 168 hours. Coagulation Profile: Recent Labs  Lab 06/06/19 2121  INR 1.1   Cardiac Enzymes: No results for input(s): CKTOTAL, CKMB, CKMBINDEX, TROPONINI in the last 168 hours. BNP (last 3 results) No results for input(s): PROBNP in the last 8760 hours. HbA1C: No results for input(s): HGBA1C in the last 72 hours. CBG: Recent Labs  Lab 06/07/19 0452 06/08/19 1133 06/09/19 0641  GLUCAP 96 84 85   Lipid Profile: No results for input(s): CHOL, HDL, LDLCALC, TRIG, CHOLHDL, LDLDIRECT in the last 72 hours. Thyroid Function Tests: Recent Labs    06/06/19 2306  TSH 2.725   Anemia Panel: No results for input(s): VITAMINB12, FOLATE, FERRITIN, TIBC, IRON, RETICCTPCT in the last 72 hours. Sepsis Labs: Recent Labs    Lab 06/06/19 2248  LATICACIDVEN 1.6    Recent Results (from the past 240 hour(s))  Blood culture (routine x 2)     Status: None (Preliminary result)   Collection Time: 06/06/19 10:49 PM   Specimen: BLOOD  Result Value Ref Range Status   Specimen Description BLOOD LEFT WRIST  Final   Special Requests   Final    BOTTLES DRAWN AEROBIC AND ANAEROBIC Blood Culture results may not be optimal due to an inadequate volume of blood received in culture bottles   Culture   Final    NO GROWTH 3 DAYS Performed at [facility], [address] [place], [place] [address]    Report Status PENDING  Incomplete  Urine culture     Status: Abnormal   Collection Time: 06/06/19 11:06 PM   Specimen: In/Out Cath Urine  Result Value Ref Range Status   Specimen Description   Final    IN/OUT CATH URINE Performed at [facility], [address], [place], [place] [address]    Special Requests   Final    NONE Performed at [facility], [address] [place], [place] [address]    Culture MULTIPLE SPECIES PRESENT, SUGGEST RECOLLECTION (A)  Final   Report Status 06/08/2019 FINAL  Final  Blood culture (routine x 2)     Status: None (Preliminary result)   Collection Time: 06/07/19  7:31 AM   Specimen: BLOOD  Result Value Ref Range Status   Specimen Description BLOOD UPPER RT ARM  Final   Special Requests   Final    BOTTLES DRAWN AEROBIC ONLY Blood Culture adequate volume   Culture   Final    NO GROWTH 2 DAYS Performed at [facility], [address], [place], [place] [address]    Report Status PENDING  Incomplete  SARS CORONAVIRUS 2 (TAT 6-24 HRS) Nasopharyngeal Nasopharyngeal Swab     Status: None   Collection Time: 06/07/19  7:58 AM   Specimen: Nasopharyngeal Swab  Result Value Ref Range Status   SARS Coronavirus 2 NEGATIVE NEGATIVE Final    Comment: (NOTE) SARS-CoV-2 target nucleic acids are NOT DETECTED. The SARS-CoV-2 RNA is generally detectable in upper  and lower respiratory specimens during the acute phase of infection. Negative results do not preclude SARS-CoV-2 infection, do not rule out co-infections with other pathogens, and should not be used as the sole basis for treatment or other patient management decisions. Negative results must be combined with clinical observations, patient history, and epidemiological information. The expected result is Negative. Fact Sheet for Patients: SugarRoll.be Fact Sheet for Healthcare Providers: https://www.woods-mathews.com/ This test is not yet approved or cleared by the [place] FDA and  has been authorized for detection and/or  diagnosis of SARS-CoV-2 by FDA under an Emergency Use Authorization (EUA). This EUA will remain  in effect (meaning this test can be used) for the duration of the COVID-19 declaration under Section 56 4(b)(1) of the Act, 21 U.S.C. section 360bbb-3(b)(1), unless the authorization is terminated or revoked sooner. Performed at [facility], [place] [address] [place], [place] [address]          Radiology Studies: ECHOCARDIOGRAM COMPLETE  Result Date: 06/08/2019    ECHOCARDIOGRAM REPORT   Patient Name:   SHANEESE LANFORD Date of Exam: 06/08/2019 Medical Rec #:  [id number]       Height:       65.0 in Accession #:    [id number]      Weight:       150.0 lb Date of Birth:  [birth year]      BSA:          1.750 m Patient Age:    4 years        BP:           88/62 mmHg Patient Gender: F               HR:           62 bpm. Exam Location:  [facility] Procedure: 2D Echo, Cardiac Doppler and Color Doppler Indications:     Syncope 780.2  History:         Patient has no prior history of Echocardiogram examinations.                  Stroke. DVT.  Sonographer:     Sherrie Sport RDCS (AE) Referring Phys:  [id number] Athena Masse Diagnosing Phys: Nelva Bush MD  Sonographer Comments: Suboptimal apical window. IMPRESSIONS  1. Left ventricular ejection  fraction, by estimation, is 55 to 60%. The left ventricle has normal function. Left ventricular endocardial border not optimally defined to evaluate regional wall motion. Left ventricular diastolic parameters were normal.  2. Right ventricular systolic function is normal. The right ventricular size is normal. Tricuspid regurgitation signal is inadequate for assessing PA pressure.  3. The mitral valve was not well visualized. Trivial mitral valve regurgitation.  4. The aortic valve is tricuspid. Aortic valve regurgitation is not visualized. No aortic stenosis is present. FINDINGS  Left Ventricle: Left ventricular ejection fraction, by estimation, is 55 to 60%. The left ventricle has normal function. Left ventricular endocardial border not optimally defined to evaluate regional wall motion. The left ventricular internal cavity size was normal in size. There is borderline left ventricular hypertrophy. Left ventricular diastolic parameters were normal. Right Ventricle: The right ventricular size is normal. No increase in right ventricular wall thickness. Right ventricular systolic function is normal. Tricuspid regurgitation signal is inadequate for assessing PA pressure. Left Atrium: Left atrial size was normal in size. Right Atrium: Right atrial size was normal in size. Pericardium: The pericardium was not well visualized. Mitral Valve: The mitral valve was not well visualized. There is mild thickening of the mitral valve leaflet(s). There is mild calcification of the mitral valve leaflet(s). Trivial mitral valve regurgitation. Tricuspid Valve: The tricuspid valve is grossly normal. Tricuspid valve regurgitation is not demonstrated. Aortic Valve: The aortic valve is tricuspid. Aortic valve regurgitation is not visualized. No aortic stenosis is present. Aortic valve mean gradient measures 3.0 mmHg. Aortic valve peak gradient measures 5.0 mmHg. Aortic valve area, by VTI measures 2.49 cm. Pulmonic Valve: The pulmonic  valve was not well visualized. Pulmonic valve regurgitation  is not visualized. No evidence of pulmonic stenosis. Aorta: The aortic root is normal in size and structure. Pulmonary Artery: The pulmonary artery is not well seen. IAS/Shunts: The interatrial septum was not well visualized.  LEFT VENTRICLE PLAX 2D LVIDd:         4.66 cm  Diastology LVIDs:         3.06 cm  LV e' lateral:   12.40 cm/s LV PW:         1.02 cm  LV E/e' lateral: 7.0 LV IVS:        0.97 cm  LV e' medial:    10.00 cm/s LVOT diam:     2.00 cm  LV E/e' medial:  8.7 LV SV:         63 LV SV Index:   36 LVOT Area:     3.14 cm  RIGHT VENTRICLE RV Basal diam:  3.17 cm RV S prime:     11.50 cm/s TAPSE (M-mode): 3.6 cm LEFT ATRIUM             Index       RIGHT ATRIUM           Index LA diam:        3.60 cm 2.06 cm/m  RA Area:     17.50 cm LA Vol (A2C):   58.5 ml 33.42 ml/m RA Volume:   47.50 ml  27.14 ml/m LA Vol (A4C):   45.5 ml 25.99 ml/m LA Biplane Vol: 52.6 ml 30.05 ml/m  AORTIC VALVE                   PULMONIC VALVE AV Area (Vmax):    2.52 cm    PV Vmax:        0.63 m/s AV Area (Vmean):   2.26 cm    PV Peak grad:   1.6 mmHg AV Area (VTI):     2.49 cm    RVOT Peak grad: 3 mmHg AV Vmax:           111.50 cm/s AV Vmean:          76.950 cm/s AV VTI:            0.252 m AV Peak Grad:      5.0 mmHg AV Mean Grad:      3.0 mmHg LVOT Vmax:         89.40 cm/s LVOT Vmean:        55.300 cm/s LVOT VTI:          0.200 m LVOT/AV VTI ratio: 0.79  AORTA Ao Root diam: 2.20 cm MITRAL VALVE MV Area (PHT): 3.77 cm    SHUNTS MV Decel Time: 201 msec    Systemic VTI:  0.20 m MV E velocity: 87.30 cm/s  Systemic Diam: 2.00 cm MV A velocity: 41.70 cm/s MV E/A ratio:  2.09 Harrell Gave End MD Electronically signed by Nelva Bush MD Signature Date/Time: 06/08/2019/4:08:13 PM    Final         Scheduled Meds: . aspirin  81 mg Oral Daily  . budesonide (PULMICORT) nebulizer solution  0.5 mg Nebulization BID  . buPROPion  150 mg Oral Daily  . clonazePAM  0.5 mg  Oral TID  . diclofenac sodium  2 g Topical QID  . enoxaparin (LOVENOX) injection  65 mg Subcutaneous Q12H  . fluticasone  2 spray Each Nare Daily  . gabapentin  600 mg Oral TID  . ipratropium-albuterol  3 mL Nebulization TID  .  lurasidone  40 mg Oral Daily  . midodrine  10 mg Oral TID WC  . multivitamin with minerals  1 tablet Oral Daily  . mupirocin ointment   Topical BID  . pantoprazole  40 mg Oral Daily  . QUEtiapine  25 mg Oral BID  . rOPINIRole  0.5 mg Oral QHS  . sodium chloride flush  3 mL Intravenous Q12H  . topiramate  200 mg Oral BID  . trazodone  300 mg Oral QHS  . vortioxetine HBr  20 mg Oral Daily  . zolpidem  5 mg Oral QHS   Continuous Infusions: . sodium chloride Stopped (06/08/19 1404)  . metronidazole 500 mg (06/09/19 0658)     LOS: 2 days    Time spent: 31 mins    Wyvonnia Dusky, MD [facility] Pager [phone]  If 7PM-7AM, please contact night-coverage [email or web address] Password [facility] 06/09/2019, 8:01 AM

## 2019-06-10 DIAGNOSIS — K9189 Other postprocedural complications and disorders of digestive system: Secondary | ICD-10-CM

## 2019-06-10 DIAGNOSIS — Y832 Surgical operation with anastomosis, bypass or graft as the cause of abnormal reaction of the patient, or of later complication, without mention of misadventure at the time of the procedure: Secondary | ICD-10-CM

## 2019-06-10 LAB — GLUCOSE, CAPILLARY: Glucose-Capillary: 82 mg/dL (ref 70–99)

## 2019-06-10 MED ORDER — SCOPOLAMINE 1 MG/3DAYS TD PT72
1.0000 | MEDICATED_PATCH | TRANSDERMAL | Status: DC
  Administered 2019-06-10 – 2019-06-13 (×2): 1.5 mg via TRANSDERMAL
  Filled 2019-06-10 (×2): qty 1

## 2019-06-10 MED ORDER — IPRATROPIUM-ALBUTEROL 0.5-2.5 (3) MG/3ML IN SOLN: 3.0000 mL | Freq: Four times a day (QID) | RESPIRATORY_TRACT | Status: DC | PRN

## 2019-06-10 NOTE — Progress Notes (Signed)
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 Possibly secondary to gastroparesis. Zofran, phenegran prn. Will start scopolamine patch. Pt unable to tolerate reglan secondary abnormal facial movements and pt is allergic to erythromycin   Hypotension: continue on IVFs. Continue to hold propranolol. Resolved  Bradycardia: on presentation. Continue to hold propranolol. Continue on tele   Hx abd wall fistula: w/ hx of gastric bypass. Will d/c flagyl and monitor    Chronic pain: on methadone. Will need to confirmed by pharmacy before restarting  Borderline personality disorder: continue on wellbutrin, klonopin, latuda, seroquel   Hx of CVA: w/ right hemiparesis. PT/OT consulted   DVT prophylaxis: lovenox Code Status: full  Family Communication:  Disposition Plan: depends on PT/OT recs   Consultants:      Procedures:    Antimicrobials:    Subjective: Pt c/o vomiting   Objective: Vitals:   06/10/19 0000 06/10/19 0400 06/10/19 0430 06/10/19 0742  BP: (!) 96/54 (!) 120/102  119/65  Pulse: 66 63  86  Resp: 16 17  17   Temp: 98.1 F (36.7 C) 98.6 F (37 C)  98.5 F (36.9 C)  TempSrc: Oral Oral  Oral  SpO2: 98% 98%  98%  Weight:   63.8 kg   Height:         Intake/Output Summary (Last 24 hours) at 06/10/2019 1132 Last data filed at 06/10/2019 [id number] Gross per 24 hour  Intake 240 ml  Output 3450 ml  Net -3210 ml   Filed Weights   06/06/19 2119 06/09/19 0500 06/10/19 0430  Weight: 68 kg 64.3 kg 63.8 kg    Examination:  General exam: Appears calm and comfortable  Respiratory system: Clear to auscultation. No rales, wheezes  Cardiovascular system: S1 & S2 +. No  rubs, gallops or clicks.  Gastrointestinal system: Abdomen is nondistended, soft and nontender. hypoactive bowel sounds heard. Central nervous system: Alert and oriented. Moves all 4 extremities  Psychiatry: Judgement and insight appear normal. Flat mood and affect    Data Reviewed: I have personally reviewed following labs and imaging studies  CBC: Recent Labs  Lab 06/06/19 2131 06/09/19 0722  WBC 4.8 2.1*  NEUTROABS 2.8  --   HGB 11.3* 9.8*  HCT 35.9* 29.2*  MCV 90.7 86.4  PLT 179 PLATELET CLUMPS NOTED ON SMEAR, UNABLE TO ESTIMATE   Basic Metabolic Panel: Recent Labs  Lab 06/06/19 2131 06/06/19 2306 06/09/19 0722  NA 137  --  139  K 3.8  --  4.1  CL 110  --  110  CO2 23  --  24  GLUCOSE 111*  --  83  BUN 13  --  6  CREATININE 0.97  --  0.60  CALCIUM 8.4*  --  8.2*  MG  --  1.9 2.0  PHOS  --   --  3.7   GFR: Estimated Creatinine Clearance: 79.1 mL/min (by C-G formula based on SCr of 0.6 mg/dL). Liver Function Tests: Recent Labs  Lab 06/06/19 2131  AST 19  ALT 23  ALKPHOS 84  BILITOT 0.4  PROT 6.1*  ALBUMIN 3.4*   No results for input(s): LIPASE, AMYLASE in the last 168 hours. No results for input(s): AMMONIA in the last 168 hours. Coagulation Profile: Recent Labs  Lab 06/06/19 2121  INR 1.1   Cardiac Enzymes: No results for input(s): CKTOTAL, CKMB, CKMBINDEX, TROPONINI in the last 168 hours. BNP (last 3 results) No results for input(s): PROBNP in the last 8760 hours. HbA1C: No results for input(s): HGBA1C in the last 72  hours. CBG: Recent Labs  Lab 06/07/19 0452 06/08/19 1133 06/09/19 0641 06/10/19 0425  GLUCAP 96 84 85 82   Lipid Profile: No results for input(s): CHOL, HDL, LDLCALC, TRIG, CHOLHDL, LDLDIRECT in the last 72 hours. Thyroid Function Tests: No results for input(s): TSH, T4TOTAL, FREET4, T3FREE, THYROIDAB in the last 72 hours. Anemia Panel: No results for input(s): VITAMINB12, FOLATE, FERRITIN, TIBC, IRON, RETICCTPCT in the last 72 hours. Sepsis Labs: Recent Labs  Lab 06/06/19 2248  LATICACIDVEN 1.6    Recent Results (from the past 240 hour(s))  Blood culture (routine x 2)     Status: None (Preliminary result)   Collection Time: 06/06/19 10:49 PM   Specimen: BLOOD  Result Value Ref Range Status   Specimen Description BLOOD LEFT WRIST  Final   Special Requests   Final    BOTTLES DRAWN AEROBIC AND ANAEROBIC Blood Culture results may not be optimal due to an inadequate volume of blood received in culture bottles   Culture   Final    NO GROWTH 4 DAYS Performed at [facility], [address], [place], [place] [address]    Report Status PENDING  Incomplete  Urine culture     Status: Abnormal   Collection Time: 06/06/19 11:06 PM   Specimen: In/Out Cath Urine  Result Value Ref Range Status   Specimen Description   Final    IN/OUT CATH URINE Performed at [facility], [address] [place], [place] [address]    Special Requests   Final    NONE Performed at [facility], [address], [place], [place] [address]    Culture MULTIPLE SPECIES PRESENT, SUGGEST RECOLLECTION (A)  Final   Report Status 06/08/2019 FINAL  Final  Blood culture (routine x 2)     Status: None (Preliminary result)   Collection Time: 06/07/19  7:31 AM   Specimen: BLOOD  Result Value Ref Range Status   Specimen Description BLOOD UPPER RT ARM  Final   Special Requests   Final    BOTTLES DRAWN AEROBIC ONLY Blood Culture adequate volume   Culture   Final    NO GROWTH 3  DAYS Performed at [facility], [address], [place], [place] [address]    Report Status PENDING  Incomplete  SARS CORONAVIRUS 2 (TAT 6-24 HRS) Nasopharyngeal Nasopharyngeal Swab     Status: None   Collection Time: 06/07/19  7:58 AM   Specimen: Nasopharyngeal Swab  Result Value Ref Range Status   SARS Coronavirus 2 NEGATIVE NEGATIVE Final    Comment: (NOTE) SARS-CoV-2 target nucleic acids are NOT DETECTED. The SARS-CoV-2 RNA is generally detectable in upper and lower respiratory specimens during the acute phase of infection. Negative results do not preclude  SARS-CoV-2 infection, do not rule out co-infections with other pathogens, and should not be used as the sole basis for treatment or other patient management decisions. Negative results must be combined with clinical observations, patient history, and epidemiological information. The expected result is Negative. Fact Sheet for Patients: SugarRoll.be Fact Sheet for Healthcare Providers: https://www.woods-mathews.com/ This test is not yet approved or cleared by the [place] FDA and  has been authorized for detection and/or diagnosis of SARS-CoV-2 by FDA under an Emergency Use Authorization (EUA). This EUA will remain  in effect (meaning this test can be used) for the duration of the COVID-19 declaration under Section 56 4(b)(1) of the Act, 21 U.S.C. section 360bbb-3(b)(1), unless the authorization is terminated or revoked sooner. Performed at [facility], [place] [address], [place], [place] [address]          Radiology Studies: No results found.      Scheduled Meds: . aspirin  81 mg Oral Daily  . budesonide (PULMICORT) nebulizer solution  0.5 mg Nebulization BID  . buPROPion  150 mg Oral Daily  . clonazePAM  0.5 mg Oral TID  . diclofenac sodium  2 g Topical QID  . enoxaparin (LOVENOX) injection  65 mg Subcutaneous Q12H  . fluticasone  2 spray Each Nare  Daily  . gabapentin  600 mg Oral TID  . ipratropium-albuterol  3 mL Nebulization BID  . lurasidone  40 mg Oral Daily  . midodrine  10 mg Oral TID WC  . multivitamin with minerals  1 tablet Oral Daily  . mupirocin ointment   Topical BID  . pantoprazole  40 mg Oral Daily  . QUEtiapine  25 mg Oral BID  . rOPINIRole  0.5 mg Oral QHS  . scopolamine  1 patch Transdermal Q72H  . sodium chloride flush  3 mL Intravenous Q12H  . topiramate  200 mg Oral BID  . trazodone  300 mg Oral QHS  . vortioxetine HBr  20 mg Oral Daily  . zolpidem  5 mg Oral QHS   Continuous Infusions: . sodium chloride 75 mL/hr at 06/10/19 0333     LOS: 3 days    Time spent: 33 mins    Wyvonnia Dusky, MD [facility] Pager [phone]  If 7PM-7AM, please contact night-coverage [email or web address] Password [facility] 06/10/2019, 11:32 AM

## 2019-06-10 NOTE — Progress Notes (Signed)
PT Cancellation Note  Patient Details Name: Leslie Marks MRN: [id number] DOB: [birth year]   Cancelled Treatment:    Reason Eval/Treat Not Completed: (Evaluation attempted for fourth day.  Upon entry to room, patient continues to report "I feel bad", but does not elaborate.  Encouraged for OOB activities to maintain strength/endurance and aid recovery process; patient closes eyes, turns head away and disengages with therapist with continued attempts at conversation.  Have attempted x4 days with consistent refusals at this time.  Will complete order due to lack of participation; please re-consult as patient medically appropriate and agreeable to therapy involvement.)   Vanissa Strength H. Owens Shark, PT, DPT, NCS 06/10/19, 9:52 AM [phone]

## 2019-06-11 LAB — CULTURE, BLOOD (ROUTINE X 2): Culture: NO GROWTH

## 2019-06-11 LAB — BASIC METABOLIC PANEL
Anion gap: 7 (ref 5–15)
BUN: 8 mg/dL (ref 6–20)
CO2: 23 mmol/L (ref 22–32)
Calcium: 8.7 mg/dL — ABNORMAL LOW (ref 8.9–10.3)
Chloride: 111 mmol/L (ref 98–111)
Creatinine, Ser: 0.73 mg/dL (ref 0.44–1.00)
GFR calc Af Amer: >60 mL/min (ref >60)
GFR calc non Af Amer: >60 mL/min (ref >60)
Glucose, Bld: 92 mg/dL (ref 70–99)
Potassium: 3.9 mmol/L (ref 3.5–5.1)
Sodium: 141 mmol/L (ref 135–145)

## 2019-06-11 LAB — CBC
HCT: 35.1 % — ABNORMAL LOW (ref 36.0–46.0)
Hemoglobin: 11.5 g/dL — ABNORMAL LOW (ref 12.0–15.0)
MCH: 29.3 pg (ref 26.0–34.0)
MCHC: 32.8 g/dL (ref 30.0–36.0)
MCV: 89.3 fL (ref 80.0–100.0)
Platelets: 129 10*3/uL — ABNORMAL LOW (ref 150–400)
RBC: 3.93 MIL/uL (ref 3.87–5.11)
RDW: 14.6 % (ref 11.5–15.5)
WBC: 2.8 10*3/uL — ABNORMAL LOW (ref 4.0–10.5)
nRBC: 0 % (ref 0.0–0.2)

## 2019-06-11 LAB — MAGNESIUM: Magnesium: 2.1 mg/dL (ref 1.7–2.4)

## 2019-06-11 LAB — PHOSPHORUS: Phosphorus: 3.9 mg/dL (ref 2.5–4.6)

## 2019-06-11 MED ORDER — ENOXAPARIN SODIUM 80 MG/0.8ML ~~LOC~~ SOLN
1.0000 mg/kg | Freq: Two times a day (BID) | SUBCUTANEOUS | Status: DC
  Administered 2019-06-11 – 2019-06-14 (×7): 75 mg via SUBCUTANEOUS
  Filled 2019-06-11 (×8): qty 0.8

## 2019-06-11 NOTE — Progress Notes (Signed)
Morning medications administered per orders. Pt. Sitting up on edge of bed eating breakfast. Denies any nausea at this time, assisted to bedside commode. Denies any additional needs at this time. Will continue to monitor.

## 2019-06-11 NOTE — Progress Notes (Signed)
Evening medications administered per orders. Pt. Resting in bed, denies any pain, nausea, or vomiting. Offered bathroom, pt. States she has already been up to bathroom with tech. Sheets are dry at this time. Will continue to monitor.

## 2019-06-11 NOTE — Progress Notes (Signed)
Shift report received from [place], [place].  Morning assessment completed, upon assessment noticed pt. Soaked in urine. Assisted pt. Up to bed side commode with minimal assistance and to shower. Complete linen change. Pt. Denies any nausea or vomiting this morning. Plan to sit pt. Up in chair after shower complete with chair alarm on. Will continue to monitor.

## 2019-06-11 NOTE — Progress Notes (Signed)
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 Etiology unclear. Possibly secondary to gastroparesis. Zofran, phenegran prn. Will continue on scopolamine patch. Pt unable to tolerate reglan secondary abnormal facial movements and pt is allergic to erythromycin   Hypotension: continue on IVFs. Continue to hold propranolol. Resolved  Bradycardia: on presentation. Continue to hold propranolol. Continue on tele   Hx abd wall fistula: w/ hx of gastric bypass. Will d/c flagyl and monitor    Chronic pain: on methadone. Will need to confirmed by pharmacy before restarting  Borderline personality disorder: continue on wellbutrin, klonopin, latuda, seroquel   Hx of CVA: w/ right hemiparesis. Will re-consult PT/OT tomorrow  Thrombocytopenia: etiology unclear. Will continue to monitor   DVT prophylaxis: lovenox Code Status: full  Family Communication:  Disposition Plan: depends on PT/OT recs  Status is: Inpatient  Remains inpatient appropriate because:IV treatments appropriate due to intensity of illness or inability to take PO   Dispo: The patient is from: Home              Anticipated d/c is to: Home              Anticipated d/c date is: 3 days              Patient currently is not  medically stable to d/c.    Consultants:      Procedures:    Antimicrobials:    Subjective: Pt c/o nausea  Objective: Vitals:   06/10/19 0742 06/10/19 1433 06/11/19 0000 06/11/19 0500  BP: 119/65 123/60    Pulse: 86 66    Resp: 17 11 18    Temp: 98.5 F (36.9 C) 98.2 F (36.8 C)    TempSrc: Oral Oral    SpO2: 98% 98%    Weight:    56.9 kg  Height:        Intake/Output Summary (Last 24 hours) at 06/11/2019 0733 Last data filed at 06/10/2019 1438 Gross per 24 hour  Intake 240 ml  Output 700 ml  Net -460 ml   Filed Weights   06/09/19 0500 06/10/19 0430 06/11/19 0500  Weight: 64.3 kg 63.8 kg 56.9 kg    Examination:  General exam: Appears calm and comfortable  Respiratory system: Clear to auscultation. No rhonchi  Cardiovascular system: S1 & S2 +. No  rubs, gallops or clicks.  Gastrointestinal system: Abdomen is nondistended, soft and nontender. hypoactive bowel sounds heard. Central nervous system: Alert and oriented. Moves all 4 extremities  Psychiatry: Judgement and insight appear normal. Flat mood and affect    Data Reviewed: I have personally reviewed following labs and imaging studies  CBC: Recent Labs  Lab 06/06/19 2131 06/09/19 0722 06/11/19 0559  WBC 4.8 2.1* 2.8*  NEUTROABS 2.8  --   --  HGB 11.3* 9.8* 11.5*  HCT 35.9* 29.2* 35.1*  MCV 90.7 86.4 89.3  PLT 179 PLATELET CLUMPS NOTED ON SMEAR, UNABLE TO ESTIMATE [id number]*   Basic Metabolic Panel: Recent Labs  Lab 06/06/19 2131 06/06/19 2306 06/09/19 0722 06/11/19 0559  NA 137  --  139 141  K 3.8  --  4.1 3.9  CL 110  --  110 111  CO2 23  --  24 23  GLUCOSE 111*  --  83 92  BUN 13  --  6 8  CREATININE 0.97  --  0.60 0.73  CALCIUM 8.4*  --  8.2* 8.7*  MG  --  1.9 2.0  --   PHOS  --   --  3.7  --    GFR: Estimated Creatinine Clearance: 78.9 mL/min (by C-G formula based on SCr of 0.73 mg/dL). Liver Function Tests: Recent Labs  Lab 06/06/19 2131  AST 19  ALT 23  ALKPHOS 84    BILITOT 0.4  PROT 6.1*  ALBUMIN 3.4*   No results for input(s): LIPASE, AMYLASE in the last 168 hours. No results for input(s): AMMONIA in the last 168 hours. Coagulation Profile: Recent Labs  Lab 06/06/19 2121  INR 1.1   Cardiac Enzymes: No results for input(s): CKTOTAL, CKMB, CKMBINDEX, TROPONINI in the last 168 hours. BNP (last 3 results) No results for input(s): PROBNP in the last 8760 hours. HbA1C: No results for input(s): HGBA1C in the last 72 hours. CBG: Recent Labs  Lab 06/07/19 0452 06/08/19 1133 06/09/19 0641 06/10/19 0425  GLUCAP 96 84 85 82   Lipid Profile: No results for input(s): CHOL, HDL, LDLCALC, TRIG, CHOLHDL, LDLDIRECT in the last 72 hours. Thyroid Function Tests: No results for input(s): TSH, T4TOTAL, FREET4, T3FREE, THYROIDAB in the last 72 hours. Anemia Panel: No results for input(s): VITAMINB12, FOLATE, FERRITIN, TIBC, IRON, RETICCTPCT in the last 72 hours. Sepsis Labs: Recent Labs  Lab 06/06/19 2248  LATICACIDVEN 1.6    Recent Results (from the past 240 hour(s))  Blood culture (routine x 2)     Status: None (Preliminary result)   Collection Time: 06/06/19 10:49 PM   Specimen: BLOOD  Result Value Ref Range Status   Specimen Description BLOOD LEFT WRIST  Final   Special Requests   Final    BOTTLES DRAWN AEROBIC AND ANAEROBIC Blood Culture results may not be optimal due to an inadequate volume of blood received in culture bottles   Culture   Final    NO GROWTH 4 DAYS Performed at [facility], [address] [place], [place] [address]    Report Status PENDING  Incomplete  Urine culture     Status: Abnormal   Collection Time: 06/06/19 11:06 PM   Specimen: In/Out Cath Urine  Result Value Ref Range Status   Specimen Description   Final    IN/OUT CATH URINE Performed at [facility], [address] [place], [place] [address]    Special Requests   Final    NONE Performed at [facility], [address] [place], [place] [address]    Culture MULTIPLE SPECIES PRESENT, SUGGEST RECOLLECTION (A)  Final   Report Status 06/08/2019 FINAL  Final  Blood culture (routine x 2)     Status: None (Preliminary result)   Collection Time: 06/07/19  7:31 AM   Specimen: BLOOD  Result Value Ref Range Status   Specimen Description BLOOD UPPER RT ARM  Final   Special Requests   Final  BOTTLES DRAWN AEROBIC ONLY Blood Culture adequate volume   Culture   Final    NO GROWTH 3 DAYS Performed at [facility], [place], [place] [address]    Report Status PENDING  Incomplete  SARS CORONAVIRUS 2 (TAT 6-24 HRS) Nasopharyngeal Nasopharyngeal Swab     Status: None   Collection Time: 06/07/19  7:58 AM   Specimen: Nasopharyngeal Swab  Result Value Ref Range Status   SARS Coronavirus 2 NEGATIVE NEGATIVE Final    Comment: (NOTE) SARS-CoV-2 target nucleic acids are NOT DETECTED. The SARS-CoV-2 RNA is generally detectable in upper and lower respiratory specimens during the acute phase of infection. Negative results do not preclude SARS-CoV-2 infection, do not rule out co-infections with other pathogens, and should not be used as the sole basis for treatment or other patient management decisions. Negative results must be combined with clinical observations, patient history, and epidemiological information. The expected result is Negative. Fact Sheet for Patients: SugarRoll.be Fact Sheet for Healthcare Providers: https://www.woods-mathews.com/ This test is not yet approved or cleared by the [place] FDA and  has been authorized for detection and/or diagnosis of SARS-CoV-2 by FDA under an Emergency Use Authorization (EUA). This EUA will remain  in effect (meaning this test can be used) for the duration of the COVID-19 declaration under Section 56 4(b)(1) of the Act, 21 U.S.C. section 360bbb-3(b)(1), unless the authorization is terminated  or revoked sooner. Performed at [facility], [place] [address] [place],  [address]          Radiology Studies: No results found.      Scheduled Meds: . aspirin  81 mg Oral Daily  . buPROPion  150 mg Oral Daily  . clonazePAM  0.5 mg Oral TID  . diclofenac sodium  2 g Topical QID  . enoxaparin (LOVENOX) injection  65 mg Subcutaneous Q12H  . fluticasone  2 spray Each Nare Daily  . gabapentin  600 mg Oral TID  . lurasidone  40 mg Oral Daily  . midodrine  10 mg Oral TID WC  . multivitamin with minerals  1 tablet Oral Daily  . mupirocin ointment   Topical BID  . pantoprazole  40 mg Oral Daily  . QUEtiapine  25 mg Oral BID  . rOPINIRole  0.5 mg Oral QHS  . scopolamine  1 patch Transdermal Q72H  . sodium chloride flush  3 mL Intravenous Q12H  . topiramate  200 mg Oral BID  . trazodone  300 mg Oral QHS  . vortioxetine HBr  20 mg Oral Daily  . zolpidem  5 mg Oral QHS   Continuous Infusions: . sodium chloride 75 mL/hr at 06/10/19 0333     LOS: 4 days    Time spent: 30 mins    Wyvonnia Dusky, MD [facility] Pager [phone]  If 7PM-7AM, please contact night-coverage [email or web address] Password [facility] 06/11/2019, 7:33 AM
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
## 2019-06-12 DIAGNOSIS — F603 Borderline personality disorder: Secondary | ICD-10-CM

## 2019-06-12 LAB — CULTURE, BLOOD (ROUTINE X 2)
Culture: NO GROWTH
Special Requests: ADEQUATE

## 2019-06-12 LAB — GLUCOSE, CAPILLARY
Glucose-Capillary: 103 mg/dL — ABNORMAL HIGH (ref 70–99)
Glucose-Capillary: 82 mg/dL (ref 70–99)
Glucose-Capillary: 88 mg/dL (ref 70–99)
Glucose-Capillary: 94 mg/dL (ref 70–99)

## 2019-06-12 MED ORDER — SODIUM CHLORIDE 0.9% FLUSH: 10.0000 mL | INTRAVENOUS | Status: DC | PRN

## 2019-06-12 MED ORDER — LACTATED RINGERS IV BOLUS
500.0000 mL | Freq: Once | INTRAVENOUS | Status: AC
  Administered 2019-06-12: 500 mL via INTRAVENOUS

## 2019-06-12 MED ORDER — SODIUM CHLORIDE 0.9% FLUSH
10.0000 mL | Freq: Two times a day (BID) | INTRAVENOUS | Status: DC
  Administered 2019-06-12 – 2019-06-14 (×3): 10 mL

## 2019-06-12 MED ORDER — ALBUMIN HUMAN 25 % IV SOLN
25.0000 g | Freq: Once | INTRAVENOUS | Status: AC
  Administered 2019-06-12: 06:00:00 25 g via INTRAVENOUS
  Filled 2019-06-12: qty 100

## 2019-06-12 NOTE — Progress Notes (Signed)
PT Cancellation Note  Patient Details Name: Leslie Marks MRN: [id number] DOB: 18-Jan-1971   Cancelled Treatment:    Reason Eval/Treat Not Completed: Patient declined, no reason specified.  Has asked for no therapy at all, but will retry on 06/13/19 to be sure.   Ramond Dial 06/12/2019, 12:00 PM  Mee Hives, PT MS Acute Rehab Dept. Number: [place]

## 2019-06-12 NOTE — Progress Notes (Signed)
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 Etiology unclear. Possibly secondary to gastroparesis. Zofran, phenegran prn. Will continue on scopolamine patch. Pt unable to tolerate reglan secondary abnormal facial movements and pt is allergic to erythromycin   Hypotension: continue on IVFs. Continue to hold propranolol. Resolved  Bradycardia: on presentation. Continue to hold propranolol. Continue on tele   Hx abd wall fistula: w/ hx of gastric bypass. Will d/c flagyl and monitor    Chronic pain: on methadone. Will need to confirmed by pharmacy before restarting  Borderline personality disorder: continue on wellbutrin, klonopin, latuda, seroquel   Hx of CVA: w/ right hemiparesis. PT/OT re-consulted  Thrombocytopenia: etiology unclear. Will continue to monitor   DVT prophylaxis: lovenox Code Status: full  Family Communication:  Disposition Plan: depends on PT/OT recs  Status is: Inpatient  Remains inpatient appropriate because:IV treatments appropriate due to intensity of illness or inability to take PO   Dispo: The patient is from: Home              Anticipated d/c is to: Home              Anticipated d/c date is: 3 days              Patient currently is not medically  stable to d/c.    Consultants:      Procedures:    Antimicrobials:    Subjective: Pt c/o dry heaves  Objective: Vitals:   06/11/19 0834 06/11/19 0900 06/11/19 1715 06/11/19 2348  BP: 101/71  123/75 (!) 83/57  Pulse: 72  75 83  Resp: 16  15 20   Temp:    98 F (36.7 C)  TempSrc:    Axillary  SpO2: 99%  99% 95%  Weight:  73.3 kg    Height:        Intake/Output Summary (Last 24 hours) at 06/12/2019 0717 Last data filed at 06/12/2019 0600 Gross per 24 hour  Intake 4376.59 ml  Output --  Net 4376.59 ml   Filed Weights   06/10/19 0430 06/11/19 0500 06/11/19 0900  Weight: 63.8 kg 56.9 kg 73.3 kg    Examination:  General exam: Appears calm and comfortable  Respiratory system: Clear to auscultation. No rales, wheezes Cardiovascular system: S1 & S2 +. No  rubs, gallops or clicks.  Gastrointestinal system: Abdomen is nondistended, soft and nontender. Normal bowel sounds heard. Central nervous system: Alert and oriented. Moves all 4 extremities  Psychiatry: Judgement and insight appear normal. Flat mood and affect    Data Reviewed: I have personally reviewed following labs and imaging studies  CBC: Recent Labs  Lab 06/06/19 2131 06/09/19 0722 06/11/19 0559  WBC 4.8 2.1* 2.8*  NEUTROABS 2.8  --   --   HGB 11.3*  9.8* 11.5*  HCT 35.9* 29.2* 35.1*  MCV 90.7 86.4 89.3  PLT 179 PLATELET CLUMPS NOTED ON SMEAR, UNABLE TO ESTIMATE [id number]*   Basic Metabolic Panel: Recent Labs  Lab 06/06/19 2131 06/06/19 2306 06/09/19 0722 06/11/19 0559  NA 137  --  139 141  K 3.8  --  4.1 3.9  CL 110  --  110 111  CO2 23  --  24 23  GLUCOSE 111*  --  83 92  BUN 13  --  6 8  CREATININE 0.97  --  0.60 0.73  CALCIUM 8.4*  --  8.2* 8.7*  MG  --  1.9 2.0 2.1  PHOS  --   --  3.7 3.9   GFR: Estimated Creatinine Clearance: 88.1 mL/min (by C-G formula based on SCr of 0.73 mg/dL). Liver Function Tests: Recent Labs  Lab 06/06/19 2131  AST 19  ALT 23  ALKPHOS 84  BILITOT 0.4   PROT 6.1*  ALBUMIN 3.4*   No results for input(s): LIPASE, AMYLASE in the last 168 hours. No results for input(s): AMMONIA in the last 168 hours. Coagulation Profile: Recent Labs  Lab 06/06/19 2121  INR 1.1   Cardiac Enzymes: No results for input(s): CKTOTAL, CKMB, CKMBINDEX, TROPONINI in the last 168 hours. BNP (last 3 results) No results for input(s): PROBNP in the last 8760 hours. HbA1C: No results for input(s): HGBA1C in the last 72 hours. CBG: Recent Labs  Lab 06/08/19 1133 06/09/19 0641 06/10/19 0425 06/12/19 0115 06/12/19 0530  GLUCAP 84 85 82 88 82   Lipid Profile: No results for input(s): CHOL, HDL, LDLCALC, TRIG, CHOLHDL, LDLDIRECT in the last 72 hours. Thyroid Function Tests: No results for input(s): TSH, T4TOTAL, FREET4, T3FREE, THYROIDAB in the last 72 hours. Anemia Panel: No results for input(s): VITAMINB12, FOLATE, FERRITIN, TIBC, IRON, RETICCTPCT in the last 72 hours. Sepsis Labs: Recent Labs  Lab 06/06/19 2248  LATICACIDVEN 1.6    Recent Results (from the past 240 hour(s))  Blood culture (routine x 2)     Status: None   Collection Time: 06/06/19 10:49 PM   Specimen: BLOOD  Result Value Ref Range Status   Specimen Description BLOOD LEFT WRIST  Final   Special Requests   Final    BOTTLES DRAWN AEROBIC AND ANAEROBIC Blood Culture results may not be optimal due to an inadequate volume of blood received in culture bottles   Culture   Final    NO GROWTH 5 DAYS Performed at [facility], [address] [place], [place] [address]    Report Status 06/11/2019 FINAL  Final  Urine culture     Status: Abnormal   Collection Time: 06/06/19 11:06 PM   Specimen: In/Out Cath Urine  Result Value Ref Range Status   Specimen Description   Final    IN/OUT CATH URINE Performed at [facility], [address], [place], [place] [address]    Special Requests   Final    NONE Performed at [facility], [address],  [place], [place] [address]    Culture MULTIPLE SPECIES PRESENT, SUGGEST RECOLLECTION (A)  Final   Report Status 06/08/2019 FINAL  Final  Blood culture (routine x 2)     Status: None   Collection Time: 06/07/19  7:31 AM   Specimen: BLOOD  Result Value Ref Range Status   Specimen Description BLOOD UPPER RT ARM  Final   Special Requests   Final    BOTTLES DRAWN AEROBIC ONLY Blood Culture adequate  volume   Culture   Final    NO GROWTH 5 DAYS Performed at [facility], [address] [place], [place] [address]    Report Status 06/12/2019 FINAL  Final  SARS CORONAVIRUS 2 (TAT 6-24 HRS) Nasopharyngeal Nasopharyngeal Swab     Status: None   Collection Time: 06/07/19  7:58 AM   Specimen: Nasopharyngeal Swab  Result Value Ref Range Status   SARS Coronavirus 2 NEGATIVE NEGATIVE Final    Comment: (NOTE) SARS-CoV-2 target nucleic acids are NOT DETECTED. The SARS-CoV-2 RNA is generally detectable in upper and lower respiratory specimens during the acute phase of infection. Negative results do not preclude SARS-CoV-2 infection, do not rule out co-infections with other pathogens, and should not be used as the sole basis for treatment or other patient management decisions. Negative results must be combined with clinical observations, patient history, and epidemiological information. The expected result is Negative. Fact Sheet for Patients: SugarRoll.be Fact Sheet for Healthcare Providers: https://www.woods-mathews.com/ This test is not yet approved or cleared by the [place] FDA and  has been authorized for detection and/or diagnosis of SARS-CoV-2 by FDA under an Emergency Use Authorization (EUA). This EUA will remain  in effect (meaning this test can be used) for the duration of the COVID-19 declaration under Section 56 4(b)(1) of the Act, 21 U.S.C. section 360bbb-3(b)(1), unless the authorization is terminated or revoked sooner. Performed  at [facility], [place] [address] [place], [place] [address]          Radiology Studies: No results found.      Scheduled Meds: . aspirin  81 mg Oral Daily  . buPROPion  150 mg Oral Daily  . clonazePAM  0.5 mg Oral TID  . diclofenac sodium  2 g Topical QID  . enoxaparin (LOVENOX) injection  1 mg/kg Subcutaneous Q12H  . fluticasone  2 spray Each Nare Daily  . gabapentin  600 mg Oral TID  . lurasidone  40 mg Oral Daily  . midodrine  10 mg Oral TID WC  . multivitamin with minerals  1 tablet Oral Daily  . mupirocin ointment   Topical BID  . pantoprazole  40 mg Oral Daily  . QUEtiapine  25 mg Oral BID  . rOPINIRole  0.5 mg Oral QHS  . scopolamine  1 patch Transdermal Q72H  . sodium chloride flush  3 mL Intravenous Q12H  . topiramate  200 mg Oral BID  . trazodone  300 mg Oral QHS  . vortioxetine HBr  20 mg Oral Daily  . zolpidem  5 mg Oral QHS   Continuous Infusions: . sodium chloride 75 mL/hr at 06/12/19 0341     LOS: 5 days    Time spent: 30 mins    Wyvonnia Dusky, MD [facility] Pager [phone]  If 7PM-7AM, please contact night-coverage [email or web address] Password [facility] 06/12/2019, 7:17 AM

## 2019-06-12 NOTE — Progress Notes (Signed)
PT Cancellation Note  Patient Details Name: Leslie Marks MRN: [id number] DOB: 03/25/1970   Cancelled Treatment:    Reason Eval/Treat Not Completed: Medical issues which prohibited therapy.  Low BP was just taken, will reattempt at another time.   Ramond Dial 06/12/2019, 9:51 AM   Mee Hives, PT MS Acute Rehab Dept. Number: [place]

## 2019-06-12 NOTE — Progress Notes (Signed)
Shift report received from [place], Therapist, sports. Morning assessment completed. Pt. Resting in bed, arouses easily to name calling. Call light is within reach, hourly rounding continues.

## 2019-06-12 NOTE — Progress Notes (Signed)
OT Cancellation Note  Patient Details Name: MAEBEL TEGETHOFF MRN: [id number] DOB: [birth year]   Cancelled Treatment:    Reason Eval/Treat Not Completed: Patient declined, no reason specified;Medical issues which prohibited therapy  OT consult received and chart reviewed. Pt with generally soft BP noted on chart review this date. In addition, pt refusing therapy. CNA/secretary who has good rapport with patient attempted to advocate for occupational therapy evaluation to no avail. Pt states "I don't need someone to tell me how to walk." Edu provided re: role of OT and pt states "I still don't want it". Will complete order and sign off at this time. Thank you.  Gerrianne Scale, [place], OTR/L ascom [phone] 06/12/19, 12:00 PM
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
## 2019-06-12 NOTE — Progress Notes (Signed)
Patient with asymptomatic hypotension. Bolus of LR and albumin ordered. Hypotension likely from multiple blood pressure affecting meds given at bedtime. Check orthostatic vitals in am.

## 2019-06-13 LAB — BASIC METABOLIC PANEL
Anion gap: 6 (ref 5–15)
BUN: 13 mg/dL (ref 6–20)
CO2: 25 mmol/L (ref 22–32)
Calcium: 9.2 mg/dL (ref 8.9–10.3)
Chloride: 108 mmol/L (ref 98–111)
Creatinine, Ser: 0.77 mg/dL (ref 0.44–1.00)
GFR calc Af Amer: >60 mL/min (ref >60)
GFR calc non Af Amer: >60 mL/min (ref >60)
Glucose, Bld: 95 mg/dL (ref 70–99)
Potassium: 3.9 mmol/L (ref 3.5–5.1)
Sodium: 139 mmol/L (ref 135–145)

## 2019-06-13 LAB — CBC
HCT: 36.8 % (ref 36.0–46.0)
Hemoglobin: 11.6 g/dL — ABNORMAL LOW (ref 12.0–15.0)
MCH: 28.6 pg (ref 26.0–34.0)
MCHC: 31.5 g/dL (ref 30.0–36.0)
MCV: 90.6 fL (ref 80.0–100.0)
Platelets: 145 10*3/uL — ABNORMAL LOW (ref 150–400)
RBC: 4.06 MIL/uL (ref 3.87–5.11)
RDW: 14.9 % (ref 11.5–15.5)
WBC: 3.5 10*3/uL — ABNORMAL LOW (ref 4.0–10.5)
nRBC: 0 % (ref 0.0–0.2)

## 2019-06-13 LAB — MAGNESIUM: Magnesium: 2.2 mg/dL (ref 1.7–2.4)

## 2019-06-13 LAB — GLUCOSE, CAPILLARY: Glucose-Capillary: 81 mg/dL (ref 70–99)

## 2019-06-13 LAB — PHOSPHORUS: Phosphorus: 4 mg/dL (ref 2.5–4.6)

## 2019-06-13 NOTE — Progress Notes (Signed)
PT Cancellation Note  Patient Details Name: Leslie Marks MRN: [id number] DOB: 10/24/1970   Cancelled Treatment:    Reason Eval/Treat Not Completed: Medical issues which prohibited therapy.  Pt is having chronic hypotension, will reattempt at another time.   Leslie Marks 06/13/2019, 9:40 AM  Mee Hives, PT MS Acute Rehab Dept. Number: [place] and [place]

## 2019-06-13 NOTE — Progress Notes (Signed)
PT Cancellation Note  Patient Details Name: Leslie Marks MRN: [id number] DOB: [birth year]   Cancelled Treatment:    Reason Eval/Treat Not Completed: Other (comment).  Pt is up to walk independently in room, has declined PT and nursing reports she is safely walking.  Will DC the order, and please have MD reorder if her needs change.  Thank you for this referral.   Ramond Dial 06/13/2019, 1:00 PM   Mee Hives, PT [facility] Acute Rehab Dept. Number: [place] and [place]

## 2019-06-13 NOTE — Progress Notes (Signed)
   06/12/19 2337  Assess: MEWS Score  Temp 98.1 F (36.7 C)  BP (!) 80/49  Pulse Rate 98  Resp 20  SpO2 95 %  O2 Device Room Air  Assess: MEWS Score  MEWS Temp 0  MEWS Systolic 2  MEWS Pulse 0  MEWS RR 0  MEWS LOC 0  MEWS Score 2  MEWS Score Color Yellow  Assess: if the MEWS score is Yellow or Red  Were vital signs taken at a resting state? Yes  Focused Assessment Documented focused assessment  Early Detection of Sepsis Score *See Row Information* Low  MEWS guidelines implemented *See Row Information* Yes  Treat  MEWS Interventions Escalated (See documentation below)  Take Vital Signs  Increase Vital Sign Frequency  Yellow: Q 2hr X 2 then Q 4hr X 2, if remains yellow, continue Q 4hrs  Escalate  MEWS: Escalate Yellow: discuss with charge nurse/RN and consider discussing with provider and RRT  Notify: Charge Nurse/RN  Name of Charge Nurse/RN Notified Siri Cole RN  Date Charge Nurse/RN Notified 06/13/19  Time Charge Nurse/RN Notified 0022  Document  Patient Outcome Other (Comment) (Pt stable-several medications given at time)  Progress note created (see row info) Yes

## 2019-06-13 NOTE — Progress Notes (Signed)
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 Etiology unclear. Possibly secondary to gastroparesis. Zofran, phenegran prn. Will continue on scopolamine patch. Pt unable to tolerate reglan secondary abnormal facial movements and pt is allergic to erythromycin   Hypotension: continue on IVFs. Continue to hold propranolol. Resolved  Bradycardia: on presentation. Continue to hold propranolol. Continue on tele   Hx abd wall fistula: w/ hx of gastric bypass. Will d/c flagyl and monitor    Chronic pain: on methadone. Will need to confirmed by pharmacy before restarting  Borderline personality disorder: continue on wellbutrin, klonopin, latuda, seroquel   Depression: likely severe. Continue on home dose of latuda, wellbutrin. Psych will be re-consulted tomorrow as they do not round on Sundays.   Hx of CVA: w/ right hemiparesis. PT/OT re-consulted but pt continues to refuse to work w/ PT/OT   Thrombocytopenia: etiology unclear. Trending up today. Will continue to monitor   DVT prophylaxis: lovenox Code Status: full  Family Communication:  Disposition Plan: depends on PT/OT recs  Status is: Inpatient  Remains inpatient appropriate because:IV  treatments appropriate due to intensity of illness or inability to take PO   Dispo: The patient is from: Home              Anticipated d/c is to: Home              Anticipated d/c date is: 3 days              Patient currently is not medically stable to d/c.    Consultants:      Procedures:    Antimicrobials:    Subjective: Pt c/o malaise  Objective: Vitals:   06/13/19 0039 06/13/19 0129 06/13/19 0326 06/13/19 0553  BP:  (!) 82/42 (!) 80/56 (!) 131/46  Pulse: 77 69 71 79  Resp: 18 20 20 15   Temp:  97.8 F (36.6 C) 97.7 F (36.5 C) 97.8 F (36.6 C)  TempSrc:  Oral Axillary Oral  SpO2: 96% 100% 100% 99%  Weight:      Height:        Intake/Output Summary (Last 24 hours) at 06/13/2019 0716 Last data filed at 06/12/2019 1858 Gross per 24 hour  Intake 360 ml  Output --  Net 360 ml   Filed Weights   06/10/19 0430 06/11/19 0500 06/11/19 0900  Weight: 63.8 kg 56.9 kg 73.3 kg    Examination:  General exam: Appears calm and comfortable  Respiratory system: Clear to auscultation. Normal RR  Cardiovascular system: S1 & S2 +. No  rubs, gallops or clicks.  Gastrointestinal system: Abdomen is nondistended, soft and nontender. Hypoactive bowel sounds heard. Central nervous system: Alert and oriented. Moves all 4 extremities  Psychiatry: Judgement  and insight appear normal. Flat mood and affect    Data Reviewed: I have personally reviewed following labs and imaging studies  CBC: Recent Labs  Lab 06/06/19 2131 06/09/19 0722 06/11/19 0559  WBC 4.8 2.1* 2.8*  NEUTROABS 2.8  --   --   HGB 11.3* 9.8* 11.5*  HCT 35.9* 29.2* 35.1*  MCV 90.7 86.4 89.3  PLT 179 PLATELET CLUMPS NOTED ON SMEAR, UNABLE TO ESTIMATE [id number]*   Basic Metabolic Panel: Recent Labs  Lab 06/06/19 2131 06/06/19 2306 06/09/19 0722 06/11/19 0559  NA 137  --  139 141  K 3.8  --  4.1 3.9  CL 110  --  110 111  CO2 23  --  24 23  GLUCOSE 111*  --  83 92  BUN 13  --  6 8  CREATININE 0.97  --   0.60 0.73  CALCIUM 8.4*  --  8.2* 8.7*  MG  --  1.9 2.0 2.1  PHOS  --   --  3.7 3.9   GFR: Estimated Creatinine Clearance: 88.1 mL/min (by C-G formula based on SCr of 0.73 mg/dL). Liver Function Tests: Recent Labs  Lab 06/06/19 2131  AST 19  ALT 23  ALKPHOS 84  BILITOT 0.4  PROT 6.1*  ALBUMIN 3.4*   No results for input(s): LIPASE, AMYLASE in the last 168 hours. No results for input(s): AMMONIA in the last 168 hours. Coagulation Profile: Recent Labs  Lab 06/06/19 2121  INR 1.1   Cardiac Enzymes: No results for input(s): CKTOTAL, CKMB, CKMBINDEX, TROPONINI in the last 168 hours. BNP (last 3 results) No results for input(s): PROBNP in the last 8760 hours. HbA1C: No results for input(s): HGBA1C in the last 72 hours. CBG: Recent Labs  Lab 06/12/19 0115 06/12/19 0530 06/12/19 1219 06/12/19 2336 06/13/19 0528  GLUCAP 88 82 103* 94 81   Lipid Profile: No results for input(s): CHOL, HDL, LDLCALC, TRIG, CHOLHDL, LDLDIRECT in the last 72 hours. Thyroid Function Tests: No results for input(s): TSH, T4TOTAL, FREET4, T3FREE, THYROIDAB in the last 72 hours. Anemia Panel: No results for input(s): VITAMINB12, FOLATE, FERRITIN, TIBC, IRON, RETICCTPCT in the last 72 hours. Sepsis Labs: Recent Labs  Lab 06/06/19 2248  LATICACIDVEN 1.6    Recent Results (from the past 240 hour(s))  Blood culture (routine x 2)     Status: None   Collection Time: 06/06/19 10:49 PM   Specimen: BLOOD  Result Value Ref Range Status   Specimen Description BLOOD LEFT WRIST  Final   Special Requests   Final    BOTTLES DRAWN AEROBIC AND ANAEROBIC Blood Culture results may not be optimal due to an inadequate volume of blood received in culture bottles   Culture   Final    NO GROWTH 5 DAYS Performed at [facility], [address], [place], [place] [address]    Report Status 06/11/2019 FINAL  Final  Urine culture     Status: Abnormal   Collection Time: 06/06/19 11:06 PM   Specimen:  In/Out Cath Urine  Result Value Ref Range Status   Specimen Description   Final    IN/OUT CATH URINE Performed at [facility], [address] [place], [place] [address]    Special Requests   Final    NONE Performed at [facility], [address] [place],  [address]    Culture MULTIPLE SPECIES PRESENT, SUGGEST RECOLLECTION (A)  Final   Report Status 06/08/2019 FINAL  Final  Blood culture (routine x 2)  Status: None   Collection Time: 06/07/19  7:31 AM   Specimen: BLOOD  Result Value Ref Range Status   Specimen Description BLOOD UPPER RT ARM  Final   Special Requests   Final    BOTTLES DRAWN AEROBIC ONLY Blood Culture adequate volume   Culture   Final    NO GROWTH 5 DAYS Performed at [facility], [address] [place], [place] [address]    Report Status 06/12/2019 FINAL  Final  SARS CORONAVIRUS 2 (TAT 6-24 HRS) Nasopharyngeal Nasopharyngeal Swab     Status: None   Collection Time: 06/07/19  7:58 AM   Specimen: Nasopharyngeal Swab  Result Value Ref Range Status   SARS Coronavirus 2 NEGATIVE NEGATIVE Final    Comment: (NOTE) SARS-CoV-2 target nucleic acids are NOT DETECTED. The SARS-CoV-2 RNA is generally detectable in upper and lower respiratory specimens during the acute phase of infection. Negative results do not preclude SARS-CoV-2 infection, do not rule out co-infections with other pathogens, and should not be used as the sole basis for treatment or other patient management decisions. Negative results must be combined with clinical observations, patient history, and epidemiological information. The expected result is Negative. Fact Sheet for Patients: SugarRoll.be Fact Sheet for Healthcare Providers: https://www.woods-mathews.com/ This test is not yet approved or cleared by the [place] FDA and  has been authorized for detection and/or diagnosis of SARS-CoV-2 by FDA under an  Emergency Use Authorization (EUA). This EUA will remain  in effect (meaning this test can be used) for the duration of the COVID-19 declaration under Section 56 4(b)(1) of the Act, 21 U.S.C. section 360bbb-3(b)(1), unless the authorization is terminated or revoked sooner. Performed at [facility], [place] [address] [place], [place] [address]          Radiology Studies: No results found.      Scheduled Meds: . aspirin  81 mg Oral Daily  . buPROPion  150 mg Oral Daily  . clonazePAM  0.5 mg Oral TID  . diclofenac sodium  2 g Topical QID  . enoxaparin (LOVENOX) injection  1 mg/kg Subcutaneous Q12H  . fluticasone  2 spray Each Nare Daily  . gabapentin  600 mg Oral TID  . lurasidone  40 mg Oral Daily  . midodrine  10 mg Oral TID WC  . multivitamin with minerals  1 tablet Oral Daily  . mupirocin ointment   Topical BID  . pantoprazole  40 mg Oral Daily  . QUEtiapine  25 mg Oral BID  . rOPINIRole  0.5 mg Oral QHS  . scopolamine  1 patch Transdermal Q72H  . sodium chloride flush  10-40 mL Intracatheter Q12H  . sodium chloride flush  3 mL Intravenous Q12H  . topiramate  200 mg Oral BID  . trazodone  300 mg Oral QHS  . vortioxetine HBr  20 mg Oral Daily  . zolpidem  5 mg Oral QHS   Continuous Infusions: . sodium chloride 75 mL/hr at 06/12/19 0341     LOS: 6 days    Time spent: 33 mins    Wyvonnia Dusky, MD [facility] Pager [phone]  If 7PM-7AM, please contact night-coverage [email or web address] Password [facility] 06/13/2019, 7:16 AM

## 2019-06-14 ENCOUNTER — Inpatient Hospital Stay: Payer: Medicaid Other

## 2019-06-14 LAB — GLUCOSE, CAPILLARY
Glucose-Capillary: 102 mg/dL — ABNORMAL HIGH (ref 70–99)
Glucose-Capillary: 91 mg/dL (ref 70–99)
Glucose-Capillary: 98 mg/dL (ref 70–99)
Glucose-Capillary: 98 mg/dL (ref 70–99)

## 2019-06-14 MED ORDER — BISACODYL 5 MG PO TBEC
10.0000 mg | DELAYED_RELEASE_TABLET | Freq: Every day | ORAL | Status: DC
  Administered 2019-06-14: 10 mg via ORAL
  Filled 2019-06-14: qty 2

## 2019-06-14 MED ORDER — DOCUSATE SODIUM 100 MG PO CAPS
200.0000 mg | ORAL_CAPSULE | Freq: Two times a day (BID) | ORAL | Status: DC
  Administered 2019-06-14: 09:00:00 200 mg via ORAL
  Filled 2019-06-14: qty 2
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
## 2019-06-14 NOTE — Plan of Care (Signed)

## 2019-06-14 NOTE — Progress Notes (Signed)
[name] NT reported to me that she went into room to get the patient blood sugar and the patient refused. Messaged Dr. Jimmye Norman and made her aware of same.

## 2019-06-14 NOTE — Progress Notes (Signed)
Lab called and reported to me that the patient refused that have her blood drawn this morning.

## 2019-06-14 NOTE — Discharge Summary (Signed)
Physician Discharge Summary  Leslie Marks XIP:[id number] DOB: 1970/12/19 DOA: 06/06/2019  PCP: Patient, No Pcp Per  Admit date: 06/06/2019 Discharge date: 06/14/2019  Admitted From:home Disposition:  home  Recommendations for Outpatient Follow-up:  1. Follow up with PCP in 1-2 weeks 2. F/u psych ASAP  Home Health: no  Equipment/Devices  Discharge Condition: stable  CODE STATUS: full  Diet recommendation: Heart Healthy  Brief/Interim Summary: HPI was taken from Dr. Damita Dunnings: Leslie Marks is a 49 y.o. female with medical history significant for borderline personality disorder, chronic pain on methadone, history of gastric bypass multiple intraoperative and postoperative including CVA with right hemiparesis, malabsorption, abdominal wall fistula who presents to the emergency room following a syncopal event at home where she had complete loss of consciousness with the fall but with no injury.  On arrival she complained of chest pain of moderate intensity retrosternal, pressure-like nonradiating.  No associated nausea vomiting or diaphoresis.  She aspirin by EMS. ED Course: On arrival to the emergency room she was noted to be hypotensive with blood pressure of 92/73, intermittently bradycardic with heart rate as low as in the 40s but mostly in the 50s.  Lab work-up was mostly unremarkable.  Troponin was 4.  WBC normal with normal lactic acid, electrolytes unremarkable, urinalysis unremarkable urine drug screen was clean.  She had an extensive imaging work-up with CT abdomen CT angio of the chest which were both unremarkable.  CT abdomen showed stable findings from prior CTs.  EKG was nonacute.  Hospitalist consulted for admission.  Hospital Course from Dr. Lenise Herald 4/21-4/26/21: Pt presented w/ intractable nausea and vomiting of unknown etiology. It could possibly be secondary to gastroparesis but pt is unable to tolerate reglan secondary to abnormal facial movements and pt is allergic to  erythromycin. Pt's nausea & vomiting improved w/ zofran and phenegran but pt intermittently had dry heaves. Furthermore, pt has hx of depression & borderline personality disorder. Pt's son recently passed away and pt has been significantly depressed from it. Psych was consulted and had no recommendations other than telling the pt to f/u outpatient w/ her psychiatrist. Pt denies any homicidal or suicidal ideations. Furthermore, PT/OT were consulted by has pt has hx of right hemiparesis secondary to previous CVA. Pt continually refused to work w/ PT or OT and stated she was able ambulate independently. Pt also refused labs a couple of different days as well. For more information, please see previous progress notes.   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 Etiology unclear. Possibly secondary to gastroparesis. Zofran, phenegran prn. Will continue on scopolamine patch. Pt unable to tolerate reglan secondary abnormal facial movements and pt is allergic to erythromycin   Constipation: XR abd showed moderate stool burden. Colace, dulcolax ordered  Depression: likely severe. Continue on home dose of latuda, wellbutrin. Psych will be re-consulted today   Hypotension: continue on IVFs. Continue  to hold propranolol. Resolved  Bradycardia: on presentation. Continue to hold propranolol. Continue  on tele   Hx abd wall fistula: w/ hx of gastric bypass. Will d/c flagyl and monitor    Chronic pain: on methadone. Will need to confirmed by pharmacy before restarting  Borderline personality disorder: continue on wellbutrin, klonopin, latuda, seroquel   Hx of CVA: w/ right hemiparesis. PT consulted but pt continues to refuse as pt states she ambulating around the room   Thrombocytopenia: etiology unclear. Continues to trend up. Will continue to monitor   Discharge Instructions  Discharge Instructions    Diet - low sodium heart healthy   Complete by: As directed    Discharge instructions   Complete by: As directed    F/U w/ psychiatry in 1-2 days; f/u w/ PCP in 1 week   Increase activity slowly   Complete by: As directed      Allergies as of 06/14/2019      Reactions   Amoxicillin Anaphylaxis   Augmentin [amoxicillin-pot Clavulanate] Anaphylaxis   Betadine [povidone Iodine] Anaphylaxis   Ciprofloxacin Anaphylaxis   Erythromycin Anaphylaxis   Latex Anaphylaxis   Penicillins Anaphylaxis   Has patient had a PCN reaction causing immediate rash, facial/tongue/throat swelling, SOB or lightheadedness with hypotension: yes Has patient had a PCN reaction causing severe rash involving mucus membranes or skin necrosis: no Has patient had a PCN reaction that required hospitalization no Has patient had a PCN reaction occurring within the last 10 years: no If all of the above answers are "NO", then may proceed with Cephalosporin use.   Adhesive [tape] Other (See Comments)   Skin "bubbles" and blisters   Lisinopril Cough      Medication List    TAKE these medications   aspirin 81 MG chewable tablet Chew 1 tablet (81 mg total) by mouth daily.   B-D 3CC LUER-LOK SYR 25GX1" 25G X 1" 3 ML Misc Generic drug: SYRINGE-NEEDLE (DISP) 3 ML AS DIRECTED FOR 90 DAYS   blood glucose meter kit and supplies Kit Dispense based on patient and insurance preference. Use once daily fasting and  as needed. (FOR ICD-9 250.00, 250.01).   buPROPion 150 MG 24 hr tablet Commonly known as: WELLBUTRIN XL Take 150 mg by mouth daily.   clonazePAM 1 MG tablet Commonly known as: KLONOPIN Take 1 tablet by mouth 3 (three) times daily.   cyanocobalamin 1000 MCG/ML injection Commonly known as: (VITAMIN B-12) INJECT 1 ML (1,000 MCG TOTAL) INTO THE SKIN ONCE A WEEK FOR 10 DOSES. WEEKLY FOR 4 WEEKS THEN MONTH* What changed: See the new instructions.   diclofenac sodium 1 % Gel Commonly known as: VOLTAREN Apply 2 g topically 4 (four) times daily. (apply to back and legs and hands)   enoxaparin 80 MG/0.8ML injection Commonly known as: LOVENOX Inject 80 mg into the skin every 12 (twelve) hours.   esomeprazole 40 MG capsule Commonly known as: NEXIUM Take 1 capsule (40 mg total) by mouth 2 (two) times daily before a meal.   fluticasone 50 MCG/ACT nasal spray Commonly known as: FLONASE Place 2 sprays into both nostrils daily.   gabapentin 600 MG tablet Commonly known as: NEURONTIN Take 1,200 mg by mouth 3 (three) times daily.   hydrOXYzine 50 MG tablet Commonly known as: ATARAX/VISTARIL Take 50 mg by mouth 3 (three) times daily as needed for anxiety or itching.   Inderal LA 120 MG 24 hr capsule Generic drug: propranolol ER Take 120 mg by mouth daily.   lurasidone  40 MG Tabs tablet Commonly known as: LATUDA Take 40 mg by mouth daily.   methadone 10 MG tablet Commonly known as: DOLOPHINE Take 70 mg by mouth every 12 (twelve) hours.   methocarbamol 750 MG tablet Commonly known as: ROBAXIN Take 750 mg by mouth every 8 (eight) hours as needed for muscle spasms.   naloxone 0.4 MG/ML injection Commonly known as: NARCAN To be used as needed for overdose   ondansetron 8 MG tablet Commonly known as: ZOFRAN Take 8 mg by mouth every 8 (eight) hours as needed for nausea.   Oxycodone HCl 10 MG Tabs Take 10 mg by mouth every 4 (four) hours as needed (pain).   pregabalin 100 MG  capsule Commonly known as: LYRICA Take 200 mg by mouth 2 (two) times daily.   promethazine 25 MG tablet Commonly known as: PHENERGAN Take 25 mg by mouth every 8 (eight) hours as needed for nausea or vomiting.   Requip 0.25 MG tablet Generic drug: rOPINIRole Take 0.5 mg by mouth at bedtime.   SEROquel 25 MG tablet Generic drug: QUEtiapine Take 50 mg by mouth 3 (three) times daily as needed.   tiZANidine 4 MG tablet Commonly known as: ZANAFLEX Take 4 mg by mouth 4 (four) times daily as needed for muscle spasms.   topiramate 200 MG tablet Commonly known as: TOPAMAX Take 200 mg by mouth 2 (two) times daily.   trazodone 300 MG tablet Commonly known as: DESYREL Take 300 mg by mouth at bedtime.   vortioxetine HBr 20 MG Tabs tablet Commonly known as: TRINTELLIX Take 20 mg by mouth daily.   zolpidem 10 MG tablet Commonly known as: AMBIEN Take 10 mg by mouth at bedtime.   ZyPREXA 20 MG tablet Generic drug: OLANZapine Take 20 mg by mouth at bedtime.       Allergies  Allergen Reactions  . Amoxicillin Anaphylaxis  . Augmentin [Amoxicillin-Pot Clavulanate] Anaphylaxis  . Betadine [Povidone Iodine] Anaphylaxis  . Ciprofloxacin Anaphylaxis  . Erythromycin Anaphylaxis  . Latex Anaphylaxis  . Penicillins Anaphylaxis    Has patient had a PCN reaction causing immediate rash, facial/tongue/throat swelling, SOB or lightheadedness with hypotension: yes Has patient had a PCN reaction causing severe rash involving mucus membranes or skin necrosis: no Has patient had a PCN reaction that required hospitalization no Has patient had a PCN reaction occurring within the last 10 years: no If all of the above answers are "NO", then may proceed with Cephalosporin use.   . Adhesive [Tape] Other (See Comments)    Skin "bubbles" and blisters  . Lisinopril Cough    Consultations:  psychiatry    Procedures/Studies: CT Angio Chest PE W and/or Wo Contrast  Result Date:  06/07/2019 CLINICAL DATA:  History of pulmonary embolism. Prior gastric bypass with abdominal fistula. Presenting with draining fistula and chest pain. EXAM: CT ANGIOGRAPHY CHEST CT ABDOMEN AND PELVIS WITH CONTRAST TECHNIQUE: Multidetector CT imaging of the chest was performed using the standard protocol during bolus administration of intravenous contrast. Multiplanar CT image reconstructions and MIPs were obtained to evaluate the vascular anatomy. Multidetector CT imaging of the abdomen and pelvis was performed using the standard protocol during bolus administration of intravenous contrast. CONTRAST:  126m OMNIPAQUE IOHEXOL 350 MG/ML SOLN COMPARISON:  None. FINDINGS: CTA CHEST FINDINGS Cardiovascular: Contrast injection is sufficient to demonstrate satisfactory opacification of the pulmonary arteries to the segmental level. There is no pulmonary embolus. The main pulmonary artery is within normal limits for size. There is no CT evidence of  acute right heart strain. The visualized aorta is normal. There is a normal 3-vessel arch branching pattern. Heart size is normal, without pericardial effusion. Mediastinum/Nodes: No mediastinal, hilar or axillary lymphadenopathy. The visualized thyroid and thoracic esophageal course are unremarkable. Lungs/Pleura: No pulmonary nodules or masses. No pleural effusion or pneumothorax. No focal airspace consolidation. No focal pleural abnormality. Musculoskeletal: No chest wall abnormality. No acute or significant osseous findings. Review of the MIP images confirms the above findings. CT ABDOMEN and PELVIS FINDINGS Hepatobiliary: Normal hepatic contours and density. Unchanged chronic biliary dilatation. Status post cholecystectomy. Pancreas: Normal contours without ductal dilatation. No peripancreatic fluid collection. Spleen: Normal. Adrenals/Urinary Tract: --Adrenal glands: Normal. --Right kidney/ureter: No hydronephrosis or perinephric stranding. No nephrolithiasis. No  obstructing ureteral stones. --Left kidney/ureter: No hydronephrosis or perinephric stranding. No nephrolithiasis. No obstructing ureteral stones. --Urinary bladder: Unremarkable. Stomach/Bowel: --Stomach/Duodenum: Status post gastric bypass. --Small bowel: No dilatation or inflammation. --Colon: No focal abnormality. --Appendix: Normal. Vascular/Lymphatic: Unchanged appearance of IVC filter. No abdominal or pelvic lymphadenopathy. Reproductive: Status post hysterectomy. No adnexal mass. Musculoskeletal. No bony spinal canal stenosis or focal osseous abnormality. Other: Lower midline ventral abdominal soft tissue wound, unchanged. No abscess or drainable fluid collection. IMPRESSION: 1. No pulmonary embolus or acute aortic syndrome. 2. No acute abnormality of the chest, abdomen or pelvis. 3. Lower midline ventral abdominal wound, unchanged and likely the site of the reported draining fistula. No abscess or fluid collection. 4. Unchanged appearance of IVC filter with limbs protruding beyond the lumen of the vessel. Electronically Signed   By: Ulyses Jarred M.D.   On: 06/07/2019 00:37   CT ABDOMEN PELVIS W CONTRAST  Result Date: 06/07/2019 CLINICAL DATA:  History of pulmonary embolism. Prior gastric bypass with abdominal fistula. Presenting with draining fistula and chest pain. EXAM: CT ANGIOGRAPHY CHEST CT ABDOMEN AND PELVIS WITH CONTRAST TECHNIQUE: Multidetector CT imaging of the chest was performed using the standard protocol during bolus administration of intravenous contrast. Multiplanar CT image reconstructions and MIPs were obtained to evaluate the vascular anatomy. Multidetector CT imaging of the abdomen and pelvis was performed using the standard protocol during bolus administration of intravenous contrast. CONTRAST:  138m OMNIPAQUE IOHEXOL 350 MG/ML SOLN COMPARISON:  None. FINDINGS: CTA CHEST FINDINGS Cardiovascular: Contrast injection is sufficient to demonstrate satisfactory opacification of the  pulmonary arteries to the segmental level. There is no pulmonary embolus. The main pulmonary artery is within normal limits for size. There is no CT evidence of acute right heart strain. The visualized aorta is normal. There is a normal 3-vessel arch branching pattern. Heart size is normal, without pericardial effusion. Mediastinum/Nodes: No mediastinal, hilar or axillary lymphadenopathy. The visualized thyroid and thoracic esophageal course are unremarkable. Lungs/Pleura: No pulmonary nodules or masses. No pleural effusion or pneumothorax. No focal airspace consolidation. No focal pleural abnormality. Musculoskeletal: No chest wall abnormality. No acute or significant osseous findings. Review of the MIP images confirms the above findings. CT ABDOMEN and PELVIS FINDINGS Hepatobiliary: Normal hepatic contours and density. Unchanged chronic biliary dilatation. Status post cholecystectomy. Pancreas: Normal contours without ductal dilatation. No peripancreatic fluid collection. Spleen: Normal. Adrenals/Urinary Tract: --Adrenal glands: Normal. --Right kidney/ureter: No hydronephrosis or perinephric stranding. No nephrolithiasis. No obstructing ureteral stones. --Left kidney/ureter: No hydronephrosis or perinephric stranding. No nephrolithiasis. No obstructing ureteral stones. --Urinary bladder: Unremarkable. Stomach/Bowel: --Stomach/Duodenum: Status post gastric bypass. --Small bowel: No dilatation or inflammation. --Colon: No focal abnormality. --Appendix: Normal. Vascular/Lymphatic: Unchanged appearance of IVC filter. No abdominal or pelvic lymphadenopathy. Reproductive: Status post hysterectomy. No adnexal mass.  Musculoskeletal. No bony spinal canal stenosis or focal osseous abnormality. Other: Lower midline ventral abdominal soft tissue wound, unchanged. No abscess or drainable fluid collection. IMPRESSION: 1. No pulmonary embolus or acute aortic syndrome. 2. No acute abnormality of the chest, abdomen or pelvis. 3.  Lower midline ventral abdominal wound, unchanged and likely the site of the reported draining fistula. No abscess or fluid collection. 4. Unchanged appearance of IVC filter with limbs protruding beyond the lumen of the vessel. Electronically Signed   By: Ulyses Jarred M.D.   On: 06/07/2019 00:37   DG Chest Portable 1 View  Result Date: 06/06/2019 CLINICAL DATA:  49 year old female with chest pain. EXAM: PORTABLE CHEST 1 VIEW COMPARISON:  Chest radiograph dated 01/31/2019. FINDINGS: The heart size and mediastinal contours are within normal limits. Both lungs are clear. The visualized skeletal structures are unremarkable. IMPRESSION: No active disease. Electronically Signed   By: Anner Crete M.D.   On: 06/06/2019 22:08   DG Abd Portable 1V  Result Date: 06/14/2019 CLINICAL DATA:  Abdominal pain and distension EXAM: PORTABLE ABDOMEN - 1 VIEW COMPARISON:  None. FINDINGS: There is contrast in the colon. There is no bowel dilatation or air-fluid level to suggest bowel obstruction. No free air. There is moderate stool in the colon. A filter is present in the inferior vena cava with the apex of the filter at the L3 level. Postoperative changes are noted in the abdomen. IMPRESSION: Moderate stool in colon. No bowel obstruction or free air. Postoperative changes noted. Electronically Signed   By: Lowella Grip III M.D.   On: 06/14/2019 08:49   ECHOCARDIOGRAM COMPLETE  Result Date: 06/08/2019    ECHOCARDIOGRAM REPORT   Patient Name:   Leslie Marks Date of Exam: 06/08/2019 Medical Rec #:  [id number]       Height:       65.0 in Accession #:    [id number]      Weight:       150.0 lb Date of Birth:  December 15, 1970      BSA:          1.750 m Patient Age:    49 years        BP:           88/62 mmHg Patient Gender: F               HR:           62 bpm. Exam Location:  [facility] Procedure: 2D Echo, Cardiac Doppler and Color Doppler Indications:     Syncope 780.2  History:         Patient has no prior history of  Echocardiogram examinations.                  Stroke. DVT.  Sonographer:     Sherrie Sport RDCS (AE) Referring Phys:  [id number] Athena Masse Diagnosing Phys: Nelva Bush MD  Sonographer Comments: Suboptimal apical window. IMPRESSIONS  1. Left ventricular ejection fraction, by estimation, is 55 to 60%. The left ventricle has normal function. Left ventricular endocardial border not optimally defined to evaluate regional wall motion. Left ventricular diastolic parameters were normal.  2. Right ventricular systolic function is normal. The right ventricular size is normal. Tricuspid regurgitation signal is inadequate for assessing PA pressure.  3. The mitral valve was not well visualized. Trivial mitral valve regurgitation.  4. The aortic valve is tricuspid. Aortic valve regurgitation is not visualized. No aortic stenosis is present. FINDINGS  Left Ventricle: Left  ventricular ejection fraction, by estimation, is 55 to 60%. The left ventricle has normal function. Left ventricular endocardial border not optimally defined to evaluate regional wall motion. The left ventricular internal cavity size was normal in size. There is borderline left ventricular hypertrophy. Left ventricular diastolic parameters were normal. Right Ventricle: The right ventricular size is normal. No increase in right ventricular wall thickness. Right ventricular systolic function is normal. Tricuspid regurgitation signal is inadequate for assessing PA pressure. Left Atrium: Left atrial size was normal in size. Right Atrium: Right atrial size was normal in size. Pericardium: The pericardium was not well visualized. Mitral Valve: The mitral valve was not well visualized. There is mild thickening of the mitral valve leaflet(s). There is mild calcification of the mitral valve leaflet(s). Trivial mitral valve regurgitation. Tricuspid Valve: The tricuspid valve is grossly normal. Tricuspid valve regurgitation is not demonstrated. Aortic Valve: The aortic  valve is tricuspid. Aortic valve regurgitation is not visualized. No aortic stenosis is present. Aortic valve mean gradient measures 3.0 mmHg. Aortic valve peak gradient measures 5.0 mmHg. Aortic valve area, by VTI measures 2.49 cm. Pulmonic Valve: The pulmonic valve was not well visualized. Pulmonic valve regurgitation is not visualized. No evidence of pulmonic stenosis. Aorta: The aortic root is normal in size and structure. Pulmonary Artery: The pulmonary artery is not well seen. IAS/Shunts: The interatrial septum was not well visualized.  LEFT VENTRICLE PLAX 2D LVIDd:         4.66 cm  Diastology LVIDs:         3.06 cm  LV e' lateral:   12.40 cm/s LV PW:         1.02 cm  LV E/e' lateral: 7.0 LV IVS:        0.97 cm  LV e' medial:    10.00 cm/s LVOT diam:     2.00 cm  LV E/e' medial:  8.7 LV SV:         63 LV SV Index:   36 LVOT Area:     3.14 cm  RIGHT VENTRICLE RV Basal diam:  3.17 cm RV S prime:     11.50 cm/s TAPSE (M-mode): 3.6 cm LEFT ATRIUM             Index       RIGHT ATRIUM           Index LA diam:        3.60 cm 2.06 cm/m  RA Area:     17.50 cm LA Vol (A2C):   58.5 ml 33.42 ml/m RA Volume:   47.50 ml  27.14 ml/m LA Vol (A4C):   45.5 ml 25.99 ml/m LA Biplane Vol: 52.6 ml 30.05 ml/m  AORTIC VALVE                   PULMONIC VALVE AV Area (Vmax):    2.52 cm    PV Vmax:        0.63 m/s AV Area (Vmean):   2.26 cm    PV Peak grad:   1.6 mmHg AV Area (VTI):     2.49 cm    RVOT Peak grad: 3 mmHg AV Vmax:           111.50 cm/s AV Vmean:          76.950 cm/s AV VTI:            0.252 m AV Peak Grad:      5.0 mmHg AV Mean Grad:  3.0 mmHg LVOT Vmax:         89.40 cm/s LVOT Vmean:        55.300 cm/s LVOT VTI:          0.200 m LVOT/AV VTI ratio: 0.79  AORTA Ao Root diam: 2.20 cm MITRAL VALVE MV Area (PHT): 3.77 cm    SHUNTS MV Decel Time: 201 msec    Systemic VTI:  0.20 m MV E velocity: 87.30 cm/s  Systemic Diam: 2.00 cm MV A velocity: 41.70 cm/s MV E/A ratio:  2.09 Nelva Bush MD Electronically  signed by Nelva Bush MD Signature Date/Time: 06/08/2019/4:08:13 PM    Final       Subjective: Pt c/o dry heaves intermittently   Discharge Exam: Vitals:   06/14/19 1400 06/14/19 1600  BP: (!) 81/25 (!) 93/45  Pulse: 60 75  Resp: 16 18  Temp:  (!) 97.5 F (36.4 C)  SpO2: 98% 100%   Vitals:   06/13/19 2348 06/14/19 0802 06/14/19 1400 06/14/19 1600  BP: (!) 88/58 (!) 100/47 (!) 81/25 (!) 93/45  Pulse: 64 80 60 75  Resp: 20 19 16 18   Temp: (!) 97.5 F (36.4 C) 97.6 F (36.4 C)  (!) 97.5 F (36.4 C)  TempSrc: Oral Oral  Oral  SpO2: 100% 97% 98% 100%  Weight:      Height:        General: Pt is alert, awake, not in acute distress. Flat mood and affect Cardiovascular:  S1/S2 +, no rubs, no gallops Respiratory: CTA bilaterally, no wheezing, no rhonchi Abdominal: Soft, NT, ND, hypoactive bowel sounds  Extremities:  no cyanosis    The results of significant diagnostics from this hospitalization (including imaging, microbiology, ancillary and laboratory) are listed below for reference.     Microbiology: Recent Results (from the past 240 hour(s))  Blood culture (routine x 2)     Status: None   Collection Time: 06/06/19 10:49 PM   Specimen: BLOOD  Result Value Ref Range Status   Specimen Description BLOOD LEFT WRIST  Final   Special Requests   Final    BOTTLES DRAWN AEROBIC AND ANAEROBIC Blood Culture results may not be optimal due to an inadequate volume of blood received in culture bottles   Culture   Final    NO GROWTH 5 DAYS Performed at [facility], [address] [place], [place] [address]    Report Status 06/11/2019 FINAL  Final  Urine culture     Status: Abnormal   Collection Time: 06/06/19 11:06 PM   Specimen: In/Out Cath Urine  Result Value Ref Range Status   Specimen Description   Final    IN/OUT CATH URINE Performed at [facility], [address], [place], [place] [address]    Special Requests   Final    NONE Performed at  [facility], [address], [place], [place] [address]    Culture MULTIPLE SPECIES PRESENT, SUGGEST RECOLLECTION (A)  Final   Report Status 06/08/2019 FINAL  Final  Blood culture (routine x 2)     Status: None   Collection Time: 06/07/19  7:31 AM   Specimen: BLOOD  Result Value Ref Range Status   Specimen Description BLOOD UPPER RT ARM  Final   Special Requests   Final    BOTTLES DRAWN AEROBIC ONLY Blood Culture adequate volume   Culture   Final    NO GROWTH 5 DAYS Performed at [facility], [address] [place], [place] [address]  Report Status 06/12/2019 FINAL  Final  SARS CORONAVIRUS 2 (TAT 6-24 HRS) Nasopharyngeal Nasopharyngeal Swab     Status: None   Collection Time: 06/07/19  7:58 AM   Specimen: Nasopharyngeal Swab  Result Value Ref Range Status   SARS Coronavirus 2 NEGATIVE NEGATIVE Final    Comment: (NOTE) SARS-CoV-2 target nucleic acids are NOT DETECTED. The SARS-CoV-2 RNA is generally detectable in upper and lower respiratory specimens during the acute phase of infection. Negative results do not preclude SARS-CoV-2 infection, do not rule out co-infections with other pathogens, and should not be used as the sole basis for treatment or other patient management decisions. Negative results must be combined with clinical observations, patient history, and epidemiological information. The expected result is Negative. Fact Sheet for Patients: SugarRoll.be Fact Sheet for Healthcare Providers: https://www.woods-mathews.com/ This test is not yet approved or cleared by the [place] FDA and  has been authorized for detection and/or diagnosis of SARS-CoV-2 by FDA under an Emergency Use Authorization (EUA). This EUA will remain  in effect (meaning this test can be used) for the duration of the COVID-19 declaration under Section 56 4(b)(1) of the Act, 21 U.S.C. section 360bbb-3(b)(1), unless the authorization  is terminated or revoked sooner. Performed at [facility], [place] [address] [place], [place] [address]      Labs: BNP (last 3 results) No results for input(s): BNP in the last 8760 hours. Basic Metabolic Panel: Recent Labs  Lab 06/09/19 0722 06/11/19 0559 06/13/19 1011  NA 139 141 139  K 4.1 3.9 3.9  CL 110 111 108  CO2 24 23 25   GLUCOSE 83 92 95  BUN 6 8 13   CREATININE 0.60 0.73 0.77  CALCIUM 8.2* 8.7* 9.2  MG 2.0 2.1 2.2  PHOS 3.7 3.9 4.0   Liver Function Tests: No results for input(s): AST, ALT, ALKPHOS, BILITOT, PROT, ALBUMIN in the last 168 hours. No results for input(s): LIPASE, AMYLASE in the last 168 hours. No results for input(s): AMMONIA in the last 168 hours. CBC: Recent Labs  Lab 06/09/19 0722 06/11/19 0559 06/13/19 1011  WBC 2.1* 2.8* 3.5*  HGB 9.8* 11.5* 11.6*  HCT 29.2* 35.1* 36.8  MCV 86.4 89.3 90.6  PLT PLATELET CLUMPS NOTED ON SMEAR, UNABLE TO ESTIMATE 129* 145*   Cardiac Enzymes: No results for input(s): CKTOTAL, CKMB, CKMBINDEX, TROPONINI in the last 168 hours. BNP: Invalid input(s): POCBNP CBG: Recent Labs  Lab 06/13/19 0528 06/14/19 0056 06/14/19 0529 06/14/19 1128 06/14/19 1655  GLUCAP 81 102* 91 98 98   D-Dimer No results for input(s): DDIMER in the last 72 hours. Hgb A1c No results for input(s): HGBA1C in the last 72 hours. Lipid Profile No results for input(s): CHOL, HDL, LDLCALC, TRIG, CHOLHDL, LDLDIRECT in the last 72 hours. Thyroid function studies No results for input(s): TSH, T4TOTAL, T3FREE, THYROIDAB in the last 72 hours.  Invalid input(s): FREET3 Anemia work up No results for input(s): VITAMINB12, FOLATE, FERRITIN, TIBC, IRON, RETICCTPCT in the last 72 hours. Urinalysis    Component Value Date/Time   COLORURINE YELLOW (A) 06/06/2019 2306   APPEARANCEUR CLEAR (A) 06/06/2019 2306   LABSPEC 1.016 06/06/2019 2306   PHURINE 5.0 06/06/2019 2306   GLUCOSEU NEGATIVE 06/06/2019 2306   HGBUR NEGATIVE 06/06/2019  2306   BILIRUBINUR NEGATIVE 06/06/2019 2306   KETONESUR NEGATIVE 06/06/2019 2306   PROTEINUR NEGATIVE 06/06/2019 2306   UROBILINOGEN 0.2 05/27/2013 0024   NITRITE NEGATIVE 06/06/2019 2306   LEUKOCYTESUR TRACE (A) 06/06/2019 2306   Sepsis  Labs Invalid input(s): PROCALCITONIN,  WBC,  LACTICIDVEN Microbiology Recent Results (from the past 240 hour(s))  Blood culture (routine x 2)     Status: None   Collection Time: 06/06/19 10:49 PM   Specimen: BLOOD  Result Value Ref Range Status   Specimen Description BLOOD LEFT WRIST  Final   Special Requests   Final    BOTTLES DRAWN AEROBIC AND ANAEROBIC Blood Culture results may not be optimal due to an inadequate volume of blood received in culture bottles   Culture   Final    NO GROWTH 5 DAYS Performed at [facility], [address], [place], [place] [address]    Report Status 06/11/2019 FINAL  Final  Urine culture     Status: Abnormal   Collection Time: 06/06/19 11:06 PM   Specimen: In/Out Cath Urine  Result Value Ref Range Status   Specimen Description   Final    IN/OUT CATH URINE Performed at [facility], [address] [place], [place] [address]    Special Requests   Final    NONE Performed at [facility], [address] [place], [place] [address]    Culture MULTIPLE SPECIES PRESENT, SUGGEST RECOLLECTION (A)  Final   Report Status 06/08/2019 FINAL  Final  Blood culture (routine x 2)     Status: None   Collection Time: 06/07/19  7:31 AM   Specimen: BLOOD  Result Value Ref Range Status   Specimen Description BLOOD UPPER RT ARM  Final   Special Requests   Final    BOTTLES DRAWN AEROBIC ONLY Blood Culture adequate volume   Culture   Final    NO GROWTH 5 DAYS Performed at [facility], [address], [place], [place] [address]    Report Status 06/12/2019 FINAL  Final  SARS CORONAVIRUS 2 (TAT 6-24 HRS) Nasopharyngeal Nasopharyngeal Swab     Status: None   Collection Time: 06/07/19   7:58 AM   Specimen: Nasopharyngeal Swab  Result Value Ref Range Status   SARS Coronavirus 2 NEGATIVE NEGATIVE Final    Comment: (NOTE) SARS-CoV-2 target nucleic acids are NOT DETECTED. The SARS-CoV-2 RNA is generally detectable in upper and lower respiratory specimens during the acute phase of infection. Negative results do not preclude SARS-CoV-2 infection, do not rule out co-infections with other pathogens, and should not be used as the sole basis for treatment or other patient management decisions. Negative results must be combined with clinical observations, patient history, and epidemiological information. The expected result is Negative. Fact Sheet for Patients: SugarRoll.be Fact Sheet for Healthcare Providers: https://www.woods-mathews.com/ This test is not yet approved or cleared by the [place] FDA and  has been authorized for detection and/or diagnosis of SARS-CoV-2 by FDA under an Emergency Use Authorization (EUA). This EUA will remain  in effect (meaning this test can be used) for the duration of the COVID-19 declaration under Section 56 4(b)(1) of the Act, 21 U.S.C. section 360bbb-3(b)(1), unless the authorization is terminated or revoked sooner. Performed at [facility], [place] [address] [place], [place] [address]      Time coordinating discharge: Over 30 minutes  SIGNED:   Wyvonnia Dusky, MD  [facility] 06/14/2019, 6:33 PM Pager   If 7PM-7AM, please contact night-coverage [email or web address]

## 2019-06-14 NOTE — Progress Notes (Signed)
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 Etiology unclear. Possibly secondary to gastroparesis. Zofran, phenegran prn. Will continue on scopolamine patch. Pt unable to tolerate reglan secondary abnormal facial movements and pt is allergic to erythromycin   Constipation: XR abd showed moderate stool burden. Colace, dulcolax ordered  Depression: likely severe. Continue on home dose of latuda, wellbutrin. Psych will be re-consulted today   Hypotension: continue on IVFs. Continue to hold propranolol. Resolved  Bradycardia: on presentation. Continue to hold propranolol. Continue on tele   Hx abd wall fistula: w/ hx of gastric bypass. Will d/c flagyl and monitor    Chronic pain: on methadone. Will need to confirmed by pharmacy before restarting  Borderline personality disorder: continue on wellbutrin, klonopin, latuda, seroquel   Hx of CVA: w/ right hemiparesis. PT consulted but pt continues to refuse as pt states she ambulating around the room   Thrombocytopenia: etiology unclear. Continues to trend up. Will continue to monitor   DVT prophylaxis: lovenox Code Status: full  Family Communication:  Disposition Plan: will likely d/c back home  Status is:  Inpatient  Remains inpatient appropriate because:IV treatments appropriate due to intensity of illness or inability to take PO   Dispo: The patient is from: Home              Anticipated d/c is to: Home              Anticipated d/c date is: 2 days              Patient currently is not medically stable to d/c.    Consultants:      Procedures:    Antimicrobials:    Subjective: Pt c/o dry heaves & constipation  Objective: Vitals:   06/13/19 1046 06/13/19 1600 06/13/19 2348 06/14/19 0802  BP: 109/65 103/61 (!) 88/58 (!) 100/47  Pulse: 85 79 64 80  Resp: 20 13 20 19   Temp: 98.1 F (36.7 C) 98.2 F (36.8 C) (!) 97.5 F (36.4 C) 97.6 F (36.4 C)  TempSrc: Oral Oral Oral Oral  SpO2: 99% 95% 100% 97%  Weight:      Height:        Intake/Output Summary (Last 24 hours) at 06/14/2019 1040 Last data filed at 06/14/2019 0957 Gross per 24 hour  Intake 720 ml  Output --  Net 720 ml   Filed Weights   06/10/19 0430 06/11/19 0500 06/11/19 0900  Weight: 63.8 kg 56.9 kg 73.3 kg    Examination:  General exam: Appears calm and comfortable  Respiratory system: Clear to auscultation. Normal RR  Cardiovascular system: S1 & S2 +. No  rubs, gallops or clicks.  Gastrointestinal system: Abdomen is distended, soft and nontender. Hypoactive bowel sounds heard. Central  nervous system: Alert and oriented. Moves all 4 extremities  Psychiatry: Judgement and insight appear normal. Flat mood and affect    Data Reviewed: I have personally reviewed following labs and imaging studies  CBC: Recent Labs  Lab 06/09/19 0722 06/11/19 0559 06/13/19 1011  WBC 2.1* 2.8* 3.5*  HGB 9.8* 11.5* 11.6*  HCT 29.2* 35.1* 36.8  MCV 86.4 89.3 90.6  PLT PLATELET CLUMPS NOTED ON SMEAR, UNABLE TO ESTIMATE 129* [id number]*   Basic Metabolic Panel: Recent Labs  Lab 06/09/19 0722 06/11/19 0559 06/13/19 1011  NA 139 141 139  K 4.1 3.9 3.9  CL 110 111 108  CO2 24 23 25   GLUCOSE 83 92 95  BUN 6 8 13    CREATININE 0.60 0.73 0.77  CALCIUM 8.2* 8.7* 9.2  MG 2.0 2.1 2.2  PHOS 3.7 3.9 4.0   GFR: Estimated Creatinine Clearance: 88.1 mL/min (by C-G formula based on SCr of 0.77 mg/dL). Liver Function Tests: No results for input(s): AST, ALT, ALKPHOS, BILITOT, PROT, ALBUMIN in the last 168 hours. No results for input(s): LIPASE, AMYLASE in the last 168 hours. No results for input(s): AMMONIA in the last 168 hours. Coagulation Profile: No results for input(s): INR, PROTIME in the last 168 hours. Cardiac Enzymes: No results for input(s): CKTOTAL, CKMB, CKMBINDEX, TROPONINI in the last 168 hours. BNP (last 3 results) No results for input(s): PROBNP in the last 8760 hours. HbA1C: No results for input(s): HGBA1C in the last 72 hours. CBG: Recent Labs  Lab 06/12/19 1219 06/12/19 2336 06/13/19 0528 06/14/19 0056 06/14/19 0529  GLUCAP 103* 94 81 102* 91   Lipid Profile: No results for input(s): CHOL, HDL, LDLCALC, TRIG, CHOLHDL, LDLDIRECT in the last 72 hours. Thyroid Function Tests: No results for input(s): TSH, T4TOTAL, FREET4, T3FREE, THYROIDAB in the last 72 hours. Anemia Panel: No results for input(s): VITAMINB12, FOLATE, FERRITIN, TIBC, IRON, RETICCTPCT in the last 72 hours. Sepsis Labs: No results for input(s): PROCALCITON, LATICACIDVEN in the last 168 hours.  Recent Results (from the past 240 hour(s))  Blood culture (routine x 2)     Status: None   Collection Time: 06/06/19 10:49 PM   Specimen: BLOOD  Result Value Ref Range Status   Specimen Description BLOOD LEFT WRIST  Final   Special Requests   Final    BOTTLES DRAWN AEROBIC AND ANAEROBIC Blood Culture results may not be optimal due to an inadequate volume of blood received in culture bottles   Culture   Final    NO GROWTH 5 DAYS Performed at [facility], [address], [place], [place] [address]    Report Status 06/11/2019 FINAL  Final  Urine culture     Status: Abnormal   Collection Time: 06/06/19 11:06  PM   Specimen: In/Out Cath Urine  Result Value Ref Range Status   Specimen Description   Final    IN/OUT CATH URINE Performed at [facility], [address], [place], [place] [address]    Special Requests   Final    NONE Performed at [facility], [address] [place], [place] [address]    Culture MULTIPLE SPECIES PRESENT, SUGGEST RECOLLECTION (A)  Final   Report Status 06/08/2019 FINAL  Final  Blood culture (routine x 2)     Status: None   Collection Time: 06/07/19  7:31 AM   Specimen: BLOOD  Result Value Ref Range Status   Specimen Description BLOOD UPPER RT ARM  Final   Special Requests   Final  BOTTLES DRAWN AEROBIC ONLY Blood Culture adequate volume   Culture   Final    NO GROWTH 5 DAYS Performed at [facility], [address] [place], [place] [address]    Report Status 06/12/2019 FINAL  Final  SARS CORONAVIRUS 2 (TAT 6-24 HRS) Nasopharyngeal Nasopharyngeal Swab     Status: None   Collection Time: 06/07/19  7:58 AM   Specimen: Nasopharyngeal Swab  Result Value Ref Range Status   SARS Coronavirus 2 NEGATIVE NEGATIVE Final    Comment: (NOTE) SARS-CoV-2 target nucleic acids are NOT DETECTED. The SARS-CoV-2 RNA is generally detectable in upper and lower respiratory specimens during the acute phase of infection. Negative results do not preclude SARS-CoV-2 infection, do not rule out co-infections with other pathogens, and should not be used as the sole basis for treatment or other patient management decisions. Negative results must be combined with clinical observations, patient history, and epidemiological information. The expected result is Negative. Fact Sheet for Patients: SugarRoll.be Fact Sheet for Healthcare Providers: https://www.woods-mathews.com/ This test is not yet approved or cleared by the [place] FDA and  has been authorized for detection and/or diagnosis of SARS-CoV-2  by FDA under an Emergency Use Authorization (EUA). This EUA will remain  in effect (meaning this test can be used) for the duration of the COVID-19 declaration under Section 56 4(b)(1) of the Act, 21 U.S.C. section 360bbb-3(b)(1), unless the authorization is terminated or revoked sooner. Performed at [facility], [place] [address] [place], [place] [address]          Radiology Studies: DG Abd Portable 1V  Result Date: 06/14/2019 CLINICAL DATA:  Abdominal pain and distension EXAM: PORTABLE ABDOMEN - 1 VIEW COMPARISON:  None. FINDINGS: There is contrast in the colon. There is no bowel dilatation or air-fluid level to suggest bowel obstruction. No free air. There is moderate stool in the colon. A filter is present in the inferior vena cava with the apex of the filter at the L3 level. Postoperative changes are noted in the abdomen. IMPRESSION: Moderate stool in colon. No bowel obstruction or free air. Postoperative changes noted. Electronically Signed   By: Lowella Grip III M.D.   On: 06/14/2019 08:49        Scheduled Meds: . aspirin  81 mg Oral Daily  . bisacodyl  10 mg Oral Daily  . buPROPion  150 mg Oral Daily  . clonazePAM  0.5 mg Oral TID  . diclofenac sodium  2 g Topical QID  . docusate sodium  200 mg Oral BID  . enoxaparin (LOVENOX) injection  1 mg/kg Subcutaneous Q12H  . fluticasone  2 spray Each Nare Daily  . gabapentin  600 mg Oral TID  . lurasidone  40 mg Oral Daily  . midodrine  10 mg Oral TID WC  . multivitamin with minerals  1 tablet Oral Daily  . mupirocin ointment   Topical BID  . pantoprazole  40 mg Oral Daily  . QUEtiapine  25 mg Oral BID  . rOPINIRole  0.5 mg Oral QHS  . scopolamine  1 patch Transdermal Q72H  . sodium chloride flush  10-40 mL Intracatheter Q12H  . sodium chloride flush  3 mL Intravenous Q12H  . topiramate  200 mg Oral BID  . trazodone  300 mg Oral QHS  . vortioxetine HBr  20 mg Oral Daily  . zolpidem  5 mg Oral QHS    Continuous Infusions: . sodium chloride 75 mL/hr at 06/14/19 1033  LOS: 7 days    Time spent: 30 mins    Wyvonnia Dusky, MD [facility] Pager [phone]  If 7PM-7AM, please contact night-coverage [email or web address] Password [email or web address] 06/14/2019, 10:40 AM

## 2019-06-14 NOTE — Progress Notes (Signed)
Midline removed before discharge. Went over discharge instructions with patient and all questions answered. Patient stated that she understood. Patient discharging home via POV.

## 2019-06-22 ENCOUNTER — Encounter: Payer: Self-pay | Admitting: Emergency Medicine

## 2019-06-22 ENCOUNTER — Emergency Department
Admission: EM | Admit: 2019-06-22 | Discharge: 2019-06-22 | Disposition: A | Discharge status: Home / Self Care | Payer: No Typology Code available for payment source | Attending: Student | Admitting: Student

## 2019-06-22 ENCOUNTER — Emergency Department: Payer: No Typology Code available for payment source

## 2019-06-22 ENCOUNTER — Other Ambulatory Visit: Payer: Self-pay

## 2019-06-22 DIAGNOSIS — R4020 Unspecified coma: Secondary | ICD-10-CM | POA: Diagnosis not present

## 2019-06-22 DIAGNOSIS — R079 Chest pain, unspecified: Secondary | ICD-10-CM | POA: Diagnosis present

## 2019-06-22 DIAGNOSIS — Z79899 Other long term (current) drug therapy: Secondary | ICD-10-CM | POA: Insufficient documentation

## 2019-06-22 DIAGNOSIS — Y92410 Unspecified street and highway as the place of occurrence of the external cause: Secondary | ICD-10-CM | POA: Insufficient documentation

## 2019-06-22 DIAGNOSIS — Z91040 Latex allergy status: Secondary | ICD-10-CM | POA: Insufficient documentation

## 2019-06-22 DIAGNOSIS — Y9389 Activity, other specified: Secondary | ICD-10-CM | POA: Insufficient documentation

## 2019-06-22 DIAGNOSIS — Z8673 Personal history of transient ischemic attack (TIA), and cerebral infarction without residual deficits: Secondary | ICD-10-CM | POA: Insufficient documentation

## 2019-06-22 DIAGNOSIS — W2210XA Striking against or struck by unspecified automobile airbag, initial encounter: Secondary | ICD-10-CM | POA: Insufficient documentation

## 2019-06-22 DIAGNOSIS — Y998 Other external cause status: Secondary | ICD-10-CM | POA: Diagnosis not present

## 2019-06-22 DIAGNOSIS — M7918 Myalgia, other site: Secondary | ICD-10-CM | POA: Diagnosis not present

## 2019-06-22 DIAGNOSIS — J45909 Unspecified asthma, uncomplicated: Secondary | ICD-10-CM | POA: Diagnosis not present

## 2019-06-22 DIAGNOSIS — Z7982 Long term (current) use of aspirin: Secondary | ICD-10-CM | POA: Diagnosis not present

## 2019-06-22 LAB — CBC WITH DIFFERENTIAL/PLATELET
Abs Immature Granulocytes: 0.01 10*3/uL (ref 0.00–0.07)
Basophils Absolute: 0 10*3/uL (ref 0.0–0.1)
Basophils Relative: 1 %
Eosinophils Absolute: 0.1 10*3/uL (ref 0.0–0.5)
Eosinophils Relative: 2 %
HCT: 31 % — ABNORMAL LOW (ref 36.0–46.0)
Hemoglobin: 9.8 g/dL — ABNORMAL LOW (ref 12.0–15.0)
Immature Granulocytes: 0 %
Lymphocytes Relative: 24 %
Lymphs Abs: 1 10*3/uL (ref 0.7–4.0)
MCH: 28.2 pg (ref 26.0–34.0)
MCHC: 31.6 g/dL (ref 30.0–36.0)
MCV: 89.1 fL (ref 80.0–100.0)
Monocytes Absolute: 0.3 10*3/uL (ref 0.1–1.0)
Monocytes Relative: 7 %
Neutro Abs: 2.8 10*3/uL (ref 1.7–7.7)
Neutrophils Relative %: 66 %
Platelets: 138 10*3/uL — ABNORMAL LOW (ref 150–400)
RBC: 3.48 MIL/uL — ABNORMAL LOW (ref 3.87–5.11)
RDW: 13.8 % (ref 11.5–15.5)
WBC: 4.2 10*3/uL (ref 4.0–10.5)
nRBC: 0 % (ref 0.0–0.2)

## 2019-06-22 LAB — COMPREHENSIVE METABOLIC PANEL
ALT: 44 U/L (ref 0–44)
AST: 27 U/L (ref 15–41)
Albumin: 3.3 g/dL — ABNORMAL LOW (ref 3.5–5.0)
Alkaline Phosphatase: 73 U/L (ref 38–126)
Anion gap: 6 (ref 5–15)
BUN: 11 mg/dL (ref 6–20)
CO2: 26 mmol/L (ref 22–32)
Calcium: 8.6 mg/dL — ABNORMAL LOW (ref 8.9–10.3)
Chloride: 105 mmol/L (ref 98–111)
Creatinine, Ser: 0.86 mg/dL (ref 0.44–1.00)
GFR calc Af Amer: >60 mL/min (ref >60)
GFR calc non Af Amer: >60 mL/min (ref >60)
Glucose, Bld: 98 mg/dL (ref 70–99)
Potassium: 3.8 mmol/L (ref 3.5–5.1)
Sodium: 137 mmol/L (ref 135–145)
Total Bilirubin: 0.5 mg/dL (ref 0.3–1.2)
Total Protein: 5.9 g/dL — ABNORMAL LOW (ref 6.5–8.1)

## 2019-06-22 LAB — TROPONIN I (HIGH SENSITIVITY)
Troponin I (High Sensitivity): 5 ng/L (ref <18)
Troponin I (High Sensitivity): 5 ng/L (ref <18)

## 2019-06-22 LAB — PROTIME-INR
INR: 1.1 (ref 0.8–1.2)
Prothrombin Time: 13.8 seconds (ref 11.4–15.2)

## 2019-06-22 LAB — ETHANOL: Alcohol, Ethyl (B): <10 mg/dL (ref <10)

## 2019-06-22 LAB — HCG, QUANTITATIVE, PREGNANCY: hCG, Beta Chain, Quant, S: 2 m[IU]/mL (ref <5)

## 2019-06-22 MED ORDER — FENTANYL CITRATE (PF) 100 MCG/2ML IJ SOLN
50.0000 ug | Freq: Once | INTRAMUSCULAR | Status: AC
  Administered 2019-06-22: 50 ug via INTRAVENOUS
  Filled 2019-06-22: qty 2

## 2019-06-22 MED ORDER — FENTANYL CITRATE (PF) 100 MCG/2ML IJ SOLN
100.0000 ug | Freq: Once | INTRAMUSCULAR | Status: AC
  Administered 2019-06-22: 22:00:00 100 ug via INTRAVENOUS
  Filled 2019-06-22: qty 2

## 2019-06-22 MED ORDER — IOHEXOL 300 MG/ML  SOLN
100.0000 mL | Freq: Once | INTRAMUSCULAR | Status: AC | PRN
  Administered 2019-06-22: 21:00:00 100 mL via INTRAVENOUS

## 2019-06-22 MED ORDER — SODIUM CHLORIDE 0.9% FLUSH: 10.0000 mL | INTRAVENOUS | Status: DC | PRN

## 2019-06-22 NOTE — ED Notes (Addendum)
Pt requests to call her boyfriend at this time to request to come and pick him up. Patient provided phone to call boyfriend. Pt has pulled off all leads for monitor and states she is ready to go home

## 2019-06-22 NOTE — ED Triage Notes (Signed)
Pt refusing to keep c-collar on.

## 2019-06-22 NOTE — ED Notes (Signed)
Pt has midline placed to L upper arm by IV team. CT notified.
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
## 2019-06-22 NOTE — ED Provider Notes (Signed)
-----------------------------------------   11:23 PM on 06/22/2019 -----------------------------------------  I took over care of this patient from Dr. Joan Mayans.  Lab work-up including troponins x2 is unremarkable, and imaging is negative for acute traumatic findings.  At this time, the patient appears comfortable and is stable for discharge home.  I counseled her on the results of the work-up.  Return precautions given, and she expressed understanding.     Arta Silence, MD 06/22/19 2324
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
## 2019-07-06 ENCOUNTER — Other Ambulatory Visit: Payer: Self-pay | Admitting: Gastroenterology

## 2019-07-06 ENCOUNTER — Ambulatory Visit: Payer: Medicaid Other | Admitting: Family Medicine

## 2019-07-06 DIAGNOSIS — D518 Other vitamin B12 deficiency anemias: Secondary | ICD-10-CM

## 2019-07-06 NOTE — Telephone Encounter (Signed)
Last refill 09/08/2018

## 2019-07-07 ENCOUNTER — Telehealth: Payer: Self-pay
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
## 2019-07-07 NOTE — Telephone Encounter (Signed)
Per clinic policy, will be unable to reschedule her appointment.  In [facility], she is still established with [facility] for primary care.  Should contact her current PCP and schedule an appointment for follow up.  Thanks

## 2019-08-25 ENCOUNTER — Emergency Department
Admission: EM | Admit: 2019-08-25 | Discharge: 2019-08-26 | Disposition: A | Discharge status: Other Acute Inpatient Hospital | Payer: Medicaid Other | Attending: Emergency Medicine | Admitting: Emergency Medicine

## 2019-08-25 ENCOUNTER — Emergency Department: Payer: Medicaid Other

## 2019-08-25 ENCOUNTER — Other Ambulatory Visit: Payer: Self-pay

## 2019-08-25 DIAGNOSIS — R4182 Altered mental status, unspecified: Secondary | ICD-10-CM | POA: Diagnosis present

## 2019-08-25 DIAGNOSIS — F603 Borderline personality disorder: Secondary | ICD-10-CM | POA: Insufficient documentation

## 2019-08-25 DIAGNOSIS — Z20822 Contact with and (suspected) exposure to covid-19: Secondary | ICD-10-CM | POA: Diagnosis not present

## 2019-08-25 DIAGNOSIS — T50902A Poisoning by unspecified drugs, medicaments and biological substances, intentional self-harm, initial encounter: Secondary | ICD-10-CM | POA: Insufficient documentation

## 2019-08-25 DIAGNOSIS — Z91040 Latex allergy status: Secondary | ICD-10-CM | POA: Insufficient documentation

## 2019-08-25 DIAGNOSIS — I509 Heart failure, unspecified: Secondary | ICD-10-CM | POA: Insufficient documentation

## 2019-08-25 DIAGNOSIS — F1910 Other psychoactive substance abuse, uncomplicated: Secondary | ICD-10-CM | POA: Diagnosis not present

## 2019-08-25 DIAGNOSIS — I2510 Atherosclerotic heart disease of native coronary artery without angina pectoris: Secondary | ICD-10-CM | POA: Insufficient documentation

## 2019-08-25 DIAGNOSIS — F320 Major depressive disorder, single episode, mild: Secondary | ICD-10-CM | POA: Diagnosis not present

## 2019-08-25 LAB — URINALYSIS, COMPLETE (UACMP) WITH MICROSCOPIC
Bacteria, UA: NONE SEEN
Bilirubin Urine: NEGATIVE
Glucose, UA: NEGATIVE mg/dL
Hgb urine dipstick: NEGATIVE
Ketones, ur: NEGATIVE mg/dL
Leukocytes,Ua: NEGATIVE
Nitrite: NEGATIVE
Protein, ur: NEGATIVE mg/dL
Specific Gravity, Urine: 1.003 — ABNORMAL LOW (ref 1.005–1.030)
pH: 7 (ref 5.0–8.0)

## 2019-08-25 LAB — URINE DRUG SCREEN, QUALITATIVE (ARMC ONLY)
Amphetamines, Ur Screen: NOT DETECTED
Barbiturates, Ur Screen: NOT DETECTED
Benzodiazepine, Ur Scrn: NOT DETECTED
Cannabinoid 50 Ng, Ur ~~LOC~~: NOT DETECTED
Cocaine Metabolite,Ur ~~LOC~~: NOT DETECTED
MDMA (Ecstasy)Ur Screen: NOT DETECTED
Methadone Scn, Ur: NOT DETECTED
Opiate, Ur Screen: NOT DETECTED
Phencyclidine (PCP) Ur S: NOT DETECTED
Tricyclic, Ur Screen: NOT DETECTED

## 2019-08-25 LAB — BLOOD GAS, VENOUS
Acid-Base Excess: 2.9 mmol/L — ABNORMAL HIGH (ref 0.0–2.0)
Bicarbonate: 29.9 mmol/L — ABNORMAL HIGH (ref 20.0–28.0)
O2 Saturation: 65.1 %
Patient temperature: 37
pCO2, Ven: 58 mmHg (ref 44.0–60.0)
pH, Ven: 7.32 (ref 7.250–7.430)
pO2, Ven: 37 mmHg (ref 32.0–45.0)

## 2019-08-25 LAB — CBC WITH DIFFERENTIAL/PLATELET
Abs Immature Granulocytes: 0 10*3/uL (ref 0.00–0.07)
Basophils Absolute: 0 10*3/uL (ref 0.0–0.1)
Basophils Relative: 1 %
Eosinophils Absolute: 0.1 10*3/uL (ref 0.0–0.5)
Eosinophils Relative: 4 %
HCT: 31.5 % — ABNORMAL LOW (ref 36.0–46.0)
Hemoglobin: 10.1 g/dL — ABNORMAL LOW (ref 12.0–15.0)
Immature Granulocytes: 0 %
Lymphocytes Relative: 36 %
Lymphs Abs: 0.9 10*3/uL (ref 0.7–4.0)
MCH: 27.1 pg (ref 26.0–34.0)
MCHC: 32.1 g/dL (ref 30.0–36.0)
MCV: 84.5 fL (ref 80.0–100.0)
Monocytes Absolute: 0.2 10*3/uL (ref 0.1–1.0)
Monocytes Relative: 6 %
Neutro Abs: 1.4 10*3/uL — ABNORMAL LOW (ref 1.7–7.7)
Neutrophils Relative %: 53 %
Platelets: 121 10*3/uL — ABNORMAL LOW (ref 150–400)
RBC: 3.73 MIL/uL — ABNORMAL LOW (ref 3.87–5.11)
RDW: 14.3 % (ref 11.5–15.5)
WBC: 2.6 10*3/uL — ABNORMAL LOW (ref 4.0–10.5)
nRBC: 0 % (ref 0.0–0.2)

## 2019-08-25 LAB — SARS CORONAVIRUS 2 BY RT PCR (HOSPITAL ORDER, PERFORMED IN ~~LOC~~ HOSPITAL LAB): SARS Coronavirus 2: NEGATIVE

## 2019-08-25 LAB — COMPREHENSIVE METABOLIC PANEL
ALT: 28 U/L (ref 0–44)
AST: 32 U/L (ref 15–41)
Albumin: 3.6 g/dL (ref 3.5–5.0)
Alkaline Phosphatase: 102 U/L (ref 38–126)
Anion gap: 8 (ref 5–15)
BUN: 9 mg/dL (ref 6–20)
CO2: 27 mmol/L (ref 22–32)
Calcium: 8.4 mg/dL — ABNORMAL LOW (ref 8.9–10.3)
Chloride: 108 mmol/L (ref 98–111)
Creatinine, Ser: 0.73 mg/dL (ref 0.44–1.00)
GFR calc Af Amer: >60 mL/min (ref >60)
GFR calc non Af Amer: >60 mL/min (ref >60)
Glucose, Bld: 95 mg/dL (ref 70–99)
Potassium: 4.5 mmol/L (ref 3.5–5.1)
Sodium: 143 mmol/L (ref 135–145)
Total Bilirubin: 0.5 mg/dL (ref 0.3–1.2)
Total Protein: 6.6 g/dL (ref 6.5–8.1)

## 2019-08-25 LAB — ETHANOL: Alcohol, Ethyl (B): <10 mg/dL (ref <10)

## 2019-08-25 MED ORDER — FLUMAZENIL 0.5 MG/5ML IV SOLN
0.5000 mg | Freq: Once | INTRAVENOUS | Status: AC
  Administered 2019-08-25: 0.5 mg via INTRAVENOUS
  Filled 2019-08-25: qty 5

## 2019-08-25 MED ORDER — LACTATED RINGERS IV BOLUS: 1000.0000 mL | Freq: Once | INTRAVENOUS | Status: DC

## 2019-08-25 MED ORDER — FLUMAZENIL 0.5 MG/5ML IV SOLN
0.2000 mg | Freq: Once | INTRAVENOUS | Status: AC
  Administered 2019-08-25: 0.2 mg via INTRAVENOUS
  Filled 2019-08-25: qty 5

## 2019-08-25 MED ORDER — SODIUM CHLORIDE 0.9 % IV SOLN
1000.0000 mL | Freq: Once | INTRAVENOUS | Status: AC
  Administered 2019-08-25: 1000 mL via INTRAVENOUS

## 2019-08-25 MED ORDER — SODIUM CHLORIDE 0.9 % IV BOLUS
1000.0000 mL | Freq: Once | INTRAVENOUS | Status: AC
  Administered 2019-08-25: 1000 mL via INTRAVENOUS

## 2019-08-25 MED ORDER — NALOXONE HCL 2 MG/2ML IJ SOSY
2.0000 mg | PREFILLED_SYRINGE | Freq: Once | INTRAMUSCULAR | Status: AC
  Administered 2019-08-25: 2 mg via INTRAVENOUS
  Filled 2019-08-25: qty 2

## 2019-08-25 NOTE — ED Notes (Signed)
IVC 

## 2019-08-25 NOTE — ED Triage Notes (Signed)
Pt comes EMS unresponsive from overdose. Pt responsive only to deep painful stimuli. Pt has had 6mg  narcan with EMS. Unsure what pt took. Pt pupils pinpoint.

## 2019-08-25 NOTE — ED Notes (Signed)
Belongings bag includes gray tank top and black shorts

## 2019-08-25 NOTE — ED Provider Notes (Signed)
Patient here with intentional overdose. H/o similar episodes. Likely polysubstance with known h/o benzo + opiate abuse. She is increasingly awake in ED and has not required additional doses of reversal medications. Awaiting psych evaluation.   Duffy Bruce, MD 08/26/19 [phone]

## 2019-08-25 NOTE — ED Notes (Signed)
Pt observed standing in doorway asking for "some help". Pt got back into bed by self, brief replaced, purewick removed, pt dressed out in red scrub pants and given socks. Warm blanket given. Pt given ginger ale and sandwich tray. Will continue to monitor. Pt calm, cooperative at this time.
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
## 2019-08-25 NOTE — ED Notes (Signed)
Pt is awake and able to converse. Pt reports chronic back pain, and chronic abdominal pain from a previous surgical site. Pt also requesting trazodone and Ambien

## 2019-08-26 ENCOUNTER — Other Ambulatory Visit: Payer: Self-pay

## 2019-08-26 ENCOUNTER — Inpatient Hospital Stay
Admit: 2019-08-26 | Discharge: 2019-08-27 | DRG: 918 | Disposition: A | Discharge status: Home / Self Care | Payer: Medicaid Other | Source: Ambulatory Visit | Attending: Psychiatry | Admitting: Psychiatry

## 2019-08-26 DIAGNOSIS — T50901A Poisoning by unspecified drugs, medicaments and biological substances, accidental (unintentional), initial encounter: Secondary | ICD-10-CM

## 2019-08-26 DIAGNOSIS — I25118 Atherosclerotic heart disease of native coronary artery with other forms of angina pectoris: Secondary | ICD-10-CM | POA: Diagnosis present

## 2019-08-26 DIAGNOSIS — Z880 Allergy status to penicillin: Secondary | ICD-10-CM

## 2019-08-26 DIAGNOSIS — F32A Depression, unspecified: Secondary | ICD-10-CM | POA: Diagnosis present

## 2019-08-26 DIAGNOSIS — T50912A Poisoning by multiple unspecified drugs, medicaments and biological substances, intentional self-harm, initial encounter: Principal | ICD-10-CM | POA: Diagnosis present

## 2019-08-26 DIAGNOSIS — R4182 Altered mental status, unspecified: Secondary | ICD-10-CM | POA: Diagnosis present

## 2019-08-26 DIAGNOSIS — G43909 Migraine, unspecified, not intractable, without status migrainosus: Secondary | ICD-10-CM | POA: Diagnosis present

## 2019-08-26 DIAGNOSIS — Z915 Personal history of self-harm: Secondary | ICD-10-CM

## 2019-08-26 DIAGNOSIS — M797 Fibromyalgia: Secondary | ICD-10-CM | POA: Diagnosis present

## 2019-08-26 DIAGNOSIS — F332 Major depressive disorder, recurrent severe without psychotic features: Secondary | ICD-10-CM | POA: Insufficient documentation

## 2019-08-26 DIAGNOSIS — Z8673 Personal history of transient ischemic attack (TIA), and cerebral infarction without residual deficits: Secondary | ICD-10-CM | POA: Diagnosis not present

## 2019-08-26 DIAGNOSIS — E119 Type 2 diabetes mellitus without complications: Secondary | ICD-10-CM | POA: Diagnosis present

## 2019-08-26 DIAGNOSIS — T50901D Poisoning by unspecified drugs, medicaments and biological substances, accidental (unintentional), subsequent encounter: Secondary | ICD-10-CM | POA: Diagnosis not present

## 2019-08-26 DIAGNOSIS — Z86711 Personal history of pulmonary embolism: Secondary | ICD-10-CM | POA: Diagnosis not present

## 2019-08-26 DIAGNOSIS — F603 Borderline personality disorder: Secondary | ICD-10-CM | POA: Diagnosis present

## 2019-08-26 DIAGNOSIS — G2581 Restless legs syndrome: Secondary | ICD-10-CM | POA: Diagnosis present

## 2019-08-26 DIAGNOSIS — F320 Major depressive disorder, single episode, mild: Secondary | ICD-10-CM | POA: Diagnosis not present

## 2019-08-26 DIAGNOSIS — Z20822 Contact with and (suspected) exposure to covid-19: Secondary | ICD-10-CM | POA: Diagnosis present

## 2019-08-26 LAB — HIV ANTIBODY (ROUTINE TESTING W REFLEX): HIV Screen 4th Generation wRfx: NONREACTIVE

## 2019-08-26 LAB — LACTIC ACID, PLASMA: Lactic Acid, Venous: 1.1 mmol/L (ref 0.5–1.9)

## 2019-08-26 LAB — PREGNANCY, URINE: Preg Test, Ur: NEGATIVE

## 2019-08-26 MED ORDER — VORTIOXETINE HBR 5 MG PO TABS
20.0000 mg | ORAL_TABLET | Freq: Every day | ORAL | Status: DC
  Administered 2019-08-26: 20 mg via ORAL
  Filled 2019-08-26 (×2): qty 4

## 2019-08-26 MED ORDER — TOPIRAMATE 100 MG PO TABS
200.0000 mg | ORAL_TABLET | Freq: Two times a day (BID) | ORAL | Status: DC
  Administered 2019-08-27: 200 mg via ORAL
  Filled 2019-08-26: qty 2

## 2019-08-26 MED ORDER — ROPINIROLE HCL 0.25 MG PO TABS
0.5000 mg | ORAL_TABLET | Freq: Every day | ORAL | Status: DC
  Filled 2019-08-26: qty 2

## 2019-08-26 MED ORDER — ALUM & MAG HYDROXIDE-SIMETH 200-200-20 MG/5ML PO SUSP: 30.0000 mL | ORAL | Status: DC | PRN

## 2019-08-26 MED ORDER — LURASIDONE HCL 40 MG PO TABS
40.0000 mg | ORAL_TABLET | Freq: Every day | ORAL | Status: DC
  Administered 2019-08-26: 40 mg via ORAL
  Filled 2019-08-26 (×2): qty 1

## 2019-08-26 MED ORDER — ASPIRIN 81 MG PO CHEW
81.0000 mg | CHEWABLE_TABLET | Freq: Every day | ORAL | Status: DC
  Administered 2019-08-27: 81 mg via ORAL
  Filled 2019-08-26: qty 1

## 2019-08-26 MED ORDER — PROPRANOLOL HCL ER 120 MG PO CP24
120.0000 mg | ORAL_CAPSULE | Freq: Every day | ORAL | Status: DC
  Filled 2019-08-26: qty 1

## 2019-08-26 MED ORDER — TRAZODONE HCL 100 MG PO TABS: 300.0000 mg | ORAL_TABLET | Freq: Every day | ORAL | Status: DC

## 2019-08-26 MED ORDER — PANTOPRAZOLE SODIUM 40 MG PO TBEC: 40.0000 mg | DELAYED_RELEASE_TABLET | Freq: Every day | ORAL | Status: DC

## 2019-08-26 MED ORDER — BUPROPION HCL ER (XL) 150 MG PO TB24
150.0000 mg | ORAL_TABLET | Freq: Every day | ORAL | Status: DC
  Administered 2019-08-27: 150 mg via ORAL
  Filled 2019-08-26: qty 1

## 2019-08-26 MED ORDER — CLONAZEPAM 0.5 MG PO TABS
1.0000 mg | ORAL_TABLET | Freq: Three times a day (TID) | ORAL | Status: DC
  Administered 2019-08-26: 1 mg via ORAL
  Filled 2019-08-26: qty 2

## 2019-08-26 MED ORDER — BUPROPION HCL ER (XL) 150 MG PO TB24
150.0000 mg | ORAL_TABLET | Freq: Every day | ORAL | Status: DC
  Administered 2019-08-26: 150 mg via ORAL
  Filled 2019-08-26: qty 1

## 2019-08-26 MED ORDER — ASPIRIN 81 MG PO CHEW: 81.0000 mg | CHEWABLE_TABLET | Freq: Every day | ORAL | Status: DC

## 2019-08-26 MED ORDER — TRAZODONE HCL 100 MG PO TABS
300.0000 mg | ORAL_TABLET | Freq: Every day | ORAL | Status: DC
  Administered 2019-08-26: 300 mg via ORAL
  Filled 2019-08-26: qty 3

## 2019-08-26 MED ORDER — MAGNESIUM HYDROXIDE 400 MG/5ML PO SUSP: 30.0000 mL | Freq: Every day | ORAL | Status: DC | PRN

## 2019-08-26 MED ORDER — OLANZAPINE 10 MG PO TABS: 20.0000 mg | ORAL_TABLET | Freq: Every day | ORAL | Status: DC

## 2019-08-26 MED ORDER — ROPINIROLE HCL 0.25 MG PO TABS: 0.5000 mg | ORAL_TABLET | Freq: Every day | ORAL | Status: DC

## 2019-08-26 MED ORDER — LACTATED RINGERS IV BOLUS
1000.0000 mL | Freq: Once | INTRAVENOUS | Status: AC
  Administered 2019-08-26: 1000 mL via INTRAVENOUS

## 2019-08-26 MED ORDER — PREGABALIN 25 MG PO CAPS
200.0000 mg | ORAL_CAPSULE | Freq: Two times a day (BID) | ORAL | Status: DC
  Administered 2019-08-27: 200 mg via ORAL
  Filled 2019-08-26: qty 8

## 2019-08-26 MED ORDER — PREGABALIN 75 MG PO CAPS
200.0000 mg | ORAL_CAPSULE | Freq: Two times a day (BID) | ORAL | Status: DC
  Administered 2019-08-26: 200 mg via ORAL
  Filled 2019-08-26: qty 1

## 2019-08-26 MED ORDER — VORTIOXETINE HBR 5 MG PO TABS
20.0000 mg | ORAL_TABLET | Freq: Every day | ORAL | Status: DC
  Administered 2019-08-27: 20 mg via ORAL
  Filled 2019-08-26: qty 4

## 2019-08-26 MED ORDER — ACETAMINOPHEN 325 MG PO TABS: 650.0000 mg | ORAL_TABLET | Freq: Four times a day (QID) | ORAL | Status: DC | PRN

## 2019-08-26 MED ORDER — OLANZAPINE 10 MG PO TABS
20.0000 mg | ORAL_TABLET | Freq: Every day | ORAL | Status: DC
  Administered 2019-08-26: 20 mg via ORAL
  Filled 2019-08-26: qty 2

## 2019-08-26 MED ORDER — LURASIDONE HCL 40 MG PO TABS
40.0000 mg | ORAL_TABLET | Freq: Every day | ORAL | Status: DC
  Administered 2019-08-27: 40 mg via ORAL
  Filled 2019-08-26: qty 1

## 2019-08-26 MED ORDER — PROPRANOLOL HCL ER 120 MG PO CP24
120.0000 mg | ORAL_CAPSULE | Freq: Every day | ORAL | Status: DC
  Administered 2019-08-27: 120 mg via ORAL
  Filled 2019-08-26: qty 1

## 2019-08-26 MED ORDER — PANTOPRAZOLE SODIUM 40 MG PO TBEC
40.0000 mg | DELAYED_RELEASE_TABLET | Freq: Every day | ORAL | Status: DC
  Administered 2019-08-27: 40 mg via ORAL
  Filled 2019-08-26: qty 1

## 2019-08-26 MED ORDER — TOPIRAMATE 25 MG PO TABS
200.0000 mg | ORAL_TABLET | Freq: Two times a day (BID) | ORAL | Status: DC
  Administered 2019-08-26: 200 mg via ORAL
  Filled 2019-08-26: qty 8

## 2019-08-26 MED ORDER — CLONAZEPAM 1 MG PO TABS
1.0000 mg | ORAL_TABLET | Freq: Three times a day (TID) | ORAL | Status: DC
  Administered 2019-08-27 (×2): 1 mg via ORAL
  Filled 2019-08-26 (×2): qty 1

## 2019-08-26 NOTE — ED Notes (Signed)
Pt eating and drinking independently. NAD noted at this time.

## 2019-08-26 NOTE — ED Notes (Signed)
Caitlyn NT changed pt & linens.
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
## 2019-08-26 NOTE — ED Notes (Signed)
Pt alert and calmly laying in bed. Bed locked low. Rails up. Call bell within reach. Pt denies thoughts to harm self.

## 2019-08-26 NOTE — BH Assessment (Signed)
PATIENT BED AVAILABLE AFTER 10PM  Patient is to be admitted to [facility] by Psychiatric Nurse Practitioner Caroline Sauger.  Attending Physician will be Dr. Weber Cooks.   Patient has been assigned to room 307, by [place].    ER staff is aware of the admission:  Carlane ER Secretary    Dr. Jari Pigg, ER MD   Eliezer Lofts Patient's Nurse   Earlie Server Patient Access.

## 2019-08-26 NOTE — ED Notes (Signed)
Pt urinated in bed. Leslie Marks/Leslie Marks NT to assist pt with peri care and a change of linens momentarily.

## 2019-08-26 NOTE — BH Assessment (Signed)
Attempted to assess pt. Pt was unable to participate in the interview.

## 2019-08-26 NOTE — ED Notes (Signed)
Snacks provided at this time.

## 2019-08-26 NOTE — ED Notes (Signed)
Pt given cup of water with ice. °
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
## 2019-08-26 NOTE — Progress Notes (Signed)
Leslie Marks is a 49 y.o. female with a history of anemia, anxiety, arrhythmia, substance abuse, chronic pain, DVT, fibromyalgia  an intentional drug overdose.  This Probation officer made two attempts to assess the patient but was unsuccessful. The patient was aroused, but her speech was slurring, which made it difficult to understand her. On the 2nd attempt, it was difficult to wake her. The patient will need to be assessed on the next shift. Who presents today brought by EMS for unresponsiveness.

## 2019-08-27 DIAGNOSIS — T50901D Poisoning by unspecified drugs, medicaments and biological substances, accidental (unintentional), subsequent encounter: Secondary | ICD-10-CM

## 2019-08-27 DIAGNOSIS — T50901A Poisoning by unspecified drugs, medicaments and biological substances, accidental (unintentional), initial encounter: Secondary | ICD-10-CM

## 2019-08-27 DIAGNOSIS — F332 Major depressive disorder, recurrent severe without psychotic features: Secondary | ICD-10-CM

## 2019-08-27 NOTE — BHH Suicide Risk Assessment (Signed)
[facility] Discharge Suicide Risk Assessment   Principal Problem: Polysubstance overdose Discharge Diagnoses: Principal Problem:   Polysubstance overdose Active Problems:   Anxiety and depression   Borderline personality disorder ([place])   Total Time spent with patient: 1 hour  Musculoskeletal: Strength & Muscle Tone: within normal limits Gait & Station: normal Patient leans: N/A  Psychiatric Specialty Exam: Review of Systems  Constitutional: Negative.   HENT: Negative.   Eyes: Negative.   Respiratory: Negative.   Cardiovascular: Negative.   Gastrointestinal: Negative.   Musculoskeletal: Negative.   Skin: Negative.   Neurological: Negative.   Psychiatric/Behavioral: Positive for sleep disturbance. Negative for self-injury and suicidal ideas.    Blood pressure 108/72, pulse 85, temperature 97.8 F (36.6 C), temperature source Oral, resp. rate 16, height 5\' 5"  (1.651 m), weight 70 kg, SpO2 99 %.Body mass index is 25.68 kg/m.  General Appearance: Casual  Eye Contact::  Good  Speech:  Clear and [id number]  Volume:  Normal  Mood:  Euthymic  Affect:  Constricted  Thought Process:  Coherent  Orientation:  Full (Time, Place, and Person)  Thought Content:  Logical and Tangential  Suicidal Thoughts:  No  Homicidal Thoughts:  No  Memory:  Immediate;   Fair Recent;   Poor Remote;   Fair  Judgement:  Impaired  Insight:  Shallow  Psychomotor Activity:  Normal  Concentration:  Fair  Recall:  [place]  Language: Fair  Akathisia:  No  Handed:  Right  AIMS (if indicated):     Assets:  Agricultural consultant Housing Resilience  Sleep:  Number of Hours: 1.75  Cognition: WNL  ADL's:  Intact   Mental Status Per Nursing Assessment::   On Admission:  NA  Demographic Factors:  Unemployed  Loss Factors: Loss of significant relationship  Historical Factors: Impulsivity  Risk Reduction Factors:   Religious beliefs about death,  Living with another person, especially a relative, Positive social support, Positive therapeutic relationship and Positive coping skills or problem solving skills  Continued Clinical Symptoms:  Severe Anxiety and/or Agitation Dysthymia Alcohol/Substance Abuse/Dependencies  Cognitive Features That Contribute To Risk:  None    Suicide Risk:  Minimal: No identifiable suicidal ideation.  Patients presenting with no risk factors but with morbid ruminations; may be classified as minimal risk based on the severity of the depressive symptoms    Plan Of Care/Follow-up recommendations:  Activity:  Activity as tolerated Diet:  Regular diet Other:  Follow-up with your act team.  No new prescriptions at discharge.  Alethia Berthold, MD 08/27/2019, 12:25 PM

## 2019-08-27 NOTE — Progress Notes (Signed)
Patient is 49 yr old white female presenting to the unit for observation after being brought in for possible ingestion of unknown substance. Patient denies any self harm and states that the last time this happened her potassium level was too low and it caused her to have a two week hospitalization. Patient is pleasant and allowed for vitals, answered all questions asked and signed unit consent forms. She denies SI/HI/AVH and pain at this time. She does endorse anxiety that is situational  related to being brought down on the unit. She also endorses depression that is related to the recent death of her son. Her skin assessment conducted with RN Catalina [place]igua was unremarkable. Abdomen surgical scar from childbirth and scar on lower back from steroid injections are to be noted for skin assessment. Patient also has right side weakness from stroke. Patient was person and belongings free from contraband. She was oriented to the unit and escorted to her room. Patient is safe on the unit with 15 minute safety checks and informed to contact staff with any concerns.    Cleo Butler-Nicholson, LPN

## 2019-08-27 NOTE — BHH Suicide Risk Assessment (Signed)
[facility] Admission Suicide Risk Assessment   Nursing information obtained from:  Patient Demographic factors:  NA Current Mental Status:  NA Loss Factors:  NA Historical Factors:  NA Risk Reduction Factors:  NA  Total Time spent with patient: 1 hour Principal Problem: Polysubstance overdose Diagnosis:  Principal Problem:   Polysubstance overdose Active Problems:   Anxiety and depression   Borderline personality disorder ([place])  Subjective Data: Patient seen and chart reviewed.  Patient presented to the emergency room with altered mental status most consistent with overdose.  Patient is denying to me and has denied consistently that she is having any suicidal thoughts intent or plan.  She denies having overdosed on any of her medicine at all although she has no decent alternatives explanation for her presentation.  Patient is requesting discharge.  Denies all mood and psychotic symptoms  Continued Clinical Symptoms:  Alcohol Use Disorder Identification Test Final Score (AUDIT): 0 The "Alcohol Use Disorders Identification Test", Guidelines for Use in Primary Care, Second Edition.  World Pharmacologist [facility]). Score between 0-7:  no or low risk or alcohol related problems. Score between 8-15:  moderate risk of alcohol related problems. Score between 16-19:  high risk of alcohol related problems. Score 20 or above:  warrants further diagnostic evaluation for alcohol dependence and treatment.   CLINICAL FACTORS:   Dysthymia Alcohol/Substance Abuse/Dependencies Personality Disorders:   Cluster B   Musculoskeletal: Strength & Muscle Tone: within normal limits Gait & Station: normal Patient leans: N/A  Psychiatric Specialty Exam: Physical Exam Vitals and nursing note reviewed.  Constitutional:      Appearance: She is well-developed.  HENT:     Head: Normocephalic and atraumatic.  Eyes:     Conjunctiva/sclera: Conjunctivae normal.     Pupils: Pupils are equal, round, and  reactive to light.  Cardiovascular:     Heart sounds: Normal heart sounds.  Pulmonary:     Effort: Pulmonary effort is normal.  Abdominal:     Palpations: Abdomen is soft.  Musculoskeletal:        General: Normal range of motion.     Cervical back: Normal range of motion.  Skin:    General: Skin is warm and dry.  Neurological:     General: No focal deficit present.     Mental Status: She is alert.  Psychiatric:        Attention and Perception: Attention normal.        Mood and Affect: Mood normal.        Cognition and Memory: She exhibits impaired recent memory.        Judgment: Judgment is impulsive.     Review of Systems  Constitutional: Negative.   HENT: Negative.   Eyes: Negative.   Respiratory: Negative.   Cardiovascular: Negative.   Gastrointestinal: Negative.   Musculoskeletal: Negative.   Skin: Negative.   Neurological: Negative.   Psychiatric/Behavioral: Negative.     Blood pressure 108/72, pulse 85, temperature 97.8 F (36.6 C), temperature source Oral, resp. rate 16, height 5\' 5"  (1.651 m), weight 70 kg, SpO2 99 %.Body mass index is 25.68 kg/m.  General Appearance: Casual  Eye Contact:  Fair  Speech:  Clear and Coherent  Volume:  Normal  Mood:  Anxious  Affect:  Congruent  Thought Process:  Coherent  Orientation:  Full (Time, Place, and Person)  Thought Content:  Logical and Tangential  Suicidal Thoughts:  No  Homicidal Thoughts:  No  Memory:  Immediate;   Fair Recent;   Poor  Remote;   Fair  Judgement:  Impaired  Insight:  Shallow  Psychomotor Activity:  Normal  Concentration:  Concentration: Fair  Recall:  [employer] of Knowledge:  Fair  Language:  Fair  Akathisia:  No  Handed:  Right  AIMS (if indicated):     Assets:  Desire for Improvement Housing Resilience  ADL's:  Intact  Cognition:  Impaired,  Mild  Sleep:  Number of Hours: 1.75      COGNITIVE FEATURES THAT CONTRIBUTE TO RISK:  Thought constriction (tunnel vision)    SUICIDE  RISK:   Minimal: No identifiable suicidal ideation.  Patients presenting with no risk factors but with morbid ruminations; may be classified as minimal risk based on the severity of the depressive symptoms  PLAN OF CARE: Patient was admitted on 15-minute checks.  On evaluation today she is requesting discharge.  She is denying any treatable symptoms and absolutely denies that she intentionally overdosed on any of her medicine.  She is not psychotic and does not appear open to changing her mind.  She has been counseled about the dangers of misuse or combining her multiple sedating medications however at this point no longer meets commitment criteria and is likely to be discharged to outpatient treatment  I certify that inpatient services furnished can reasonably be expected to improve the patient's condition.   Alethia Berthold, MD 08/27/2019, 11:57 AM

## 2019-08-27 NOTE — Progress Notes (Signed)
D: Pt alert and oriented x 4. Pt denies experiencing any pain, SI/HI, or AVH at this time. Pt reports she will be able to keep herself safe when she returns home.   A: Pt received discharge and medication education/information. Pt belongings were returned and confirmed to all be present upon discharge.   R: Pt verbalized understanding of discharge and medication education/information.  Pt escorted to medical mall front lobby where safe transport picked pt up.

## 2019-08-27 NOTE — BHH Group Notes (Signed)
[place] Group Notes:  (Nursing/MHT/Case Management/Adjunct)  Date:  08/27/2019  Time:  2:02 PM  Type of Therapy:  Community Meeting  Participation Level:  Minimal  Participation Quality:  Appropriate  Affect:  Appropriate  Cognitive:  Appropriate  Insight:  Appropriate  Engagement in Group:  Developing/Improving  Modes of Intervention:  Discussion  Summary of Progress/Problems:  Judeth Porch 08/27/2019, 2:02 PM

## 2019-08-27 NOTE — Plan of Care (Signed)
°  Problem: Education: Goal: Knowledge of [facility] Education information/materials will improve Outcome: Not Progressing Goal: Emotional status will improve Outcome: Not Progressing Goal: Mental status will improve Outcome: Not Progressing Goal: Verbalization of understanding the information provided will improve Outcome: Not Progressing   Problem: Activity: Goal: Interest or engagement in activities will improve Outcome: Not Progressing Goal: Sleeping patterns will improve Outcome: Not Progressing   Problem: Coping: Goal: Ability to verbalize frustrations and anger appropriately will improve Outcome: Not Progressing Goal: Ability to demonstrate self-control will improve Outcome: Not Progressing   Problem: Health Behavior/Discharge Planning: Goal: Identification of resources available to assist in meeting health care needs will improve Outcome: Not Progressing Goal: Compliance with treatment plan for underlying cause of condition will improve Outcome: Not Progressing   Problem: Physical Regulation: Goal: Ability to maintain clinical measurements within normal limits will improve Outcome: Not Progressing   Problem: Safety: Goal: Periods of time without injury will increase Outcome: Not Progressing   Problem: Education: Goal: Knowledge of disease or condition will improve Outcome: Not Progressing Goal: Understanding of discharge needs will improve Outcome: Not Progressing   Problem: Health Behavior/Discharge Planning: Goal: Ability to identify changes in lifestyle to reduce recurrence of condition will improve Outcome: Not Progressing Goal: Identification of resources available to assist in meeting health care needs will improve Outcome: Not Progressing   Problem: Physical Regulation: Goal: Complications related to the disease process, condition or treatment will be avoided or minimized Outcome: Not Progressing   Problem: Safety: Goal: Ability to remain  free from injury will improve Outcome: Not Progressing   Problem: Education: New Admit  Goal: Ability to make informed decisions regarding treatment will improve Outcome: Not Progressing   Problem: Coping: Goal: Coping ability will improve Outcome: Not Progressing   Problem: Health Behavior/Discharge Planning: Goal: Identification of resources available to assist in meeting health care needs will improve Outcome: Not Progressing   Problem: Medication: Goal: Compliance with prescribed medication regimen will improve Outcome: Not Progressing   Problem: Self-Concept: Goal: Ability to disclose and discuss suicidal ideas will improve Outcome: Not Progressing Goal: Will verbalize positive feelings about self Outcome: Not Progressing

## 2019-08-27 NOTE — H&P (Signed)
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 only recently written controlled substances although I know from experience that occasionally methadone will not show up in the database.  Patient insists that she is not currently taking methadone.  At this point it is unlikely that she would benefit from any further hospital stay.  I am taking her off of involuntary commitment and will discharge her with the plan for her to continue care with her act team.  No new prescriptions to be given.  Observation Level/Precautions:  15 minute checks  Laboratory:  UDS  Psychotherapy:    Medications:    Consultations:    Discharge Concerns:    Estimated LOS:  Other:     Physician Treatment Plan for Primary Diagnosis:  Polysubstance overdose Long Term Goal(s): Improvement in symptoms so as ready for discharge  Short Term Goals: Ability to verbalize feelings will improve and Ability to demonstrate self-control will improve  Physician Treatment Plan for Secondary Diagnosis: Principal Problem:   Polysubstance overdose Active Problems:   Anxiety and depression   Borderline personality disorder ([place])  Long Term Goal(s): Improvement in symptoms so as ready for discharge  Short Term Goals: Compliance with prescribed medications will improve  I certify that inpatient services furnished can reasonably be expected to improve the patient's condition.    Alethia Berthold, MD 7/9/202112:01 PM

## 2019-08-27 NOTE — [facility] Suicide Risk Assessment (Signed)
[facility] INPATIENT:  Family/Significant Other Suicide Prevention Education  Suicide Prevention Education:  Contact Attempts: Leslie Marks, [phone],  has been identified by the patient as the family member/significant other with whom the patient will be residing, and identified as the person(s) who will aid the patient in the event of a mental health crisis.  With written consent from the patient, two attempts were made to provide suicide prevention education, prior to and/or following the patient's discharge.  We were unsuccessful in providing suicide prevention education.  A suicide education pamphlet was given to the patient to share with family/significant other.  Date and time of first attempt: 08/27/2019 at 12:50PM Date and time of second attempt: Second attempt is needed.  CSW left HIPAA compliant voicemail.    Leslie Marks 08/27/2019, 12:58 PM-

## 2019-08-27 NOTE — Plan of Care (Signed)
D: Pt alert and oriented x 4. Pt reports experiencing anxiety 8/10 r/t to being here and wanting to discharge. Pt states she has things she needs to take care of like her son's funeral. Pt did not receive medications for sleep and reports she did not sleep well. Pt reports experiencing 8/10 back pain. Pt denies experiencing any SI/HI, or AVH at this time.   A: Scheduled medications administered to pt, per MD orders. Support and encouragement provided. Frequent verbal contact made. Routine safety checks conducted q15 minutes.   R: No adverse drug reactions noted. Pt verbally contracts for safety at this time. Pt complaint with medications and treatment plan. Pt interacts well with others on the unit. Pt remains safe at this time. Will continue to monitor.   Problem: Medication: Goal: Compliance with prescribed medication regimen will improve Outcome: Progressing   Problem: Education: Goal: Emotional status will improve Outcome: Not Progressing Goal: Mental status will improve Outcome: Not Progressing   Problem: Activity: Goal: Interest or engagement in activities will improve Outcome: Not Progressing Goal: Sleeping patterns will improve Outcome: Not Progressing   Problem: Coping: Goal: Coping ability will improve Outcome: Not Progressing

## 2019-08-27 NOTE — BHH Counselor (Signed)
Adult Comprehensive Assessment  Patient ID: Leslie Marks, female   DOB: 08-Oct-1970, 49 y.o.   MRN: [id number]  Information Source:    Current Stressors:     Living/Environment/Situation:  Living Arrangements: Spouse/significant other, Parent, Other relatives  Family History:     Childhood History:     Education:     Employment/Work Situation:      Pensions consultant:      Alcohol/Substance Abuse:      Social Support System:      Leisure/Recreation:      Strengths/Needs:      Discharge Plan:      Summary/Recommendations:   Summary and Recommendations (to be completed by the evaluator): Patient is a 49 year old female from [place], [place] ([place]).   She presents to the hospital for concerns for depression, lack of sleep, and possible auditory/visual hallucinations.  Recommendations include: crisis stabilization, therapeutic milieu, encourage group attendance and participation, medication management for detox/mood stabilization and development of comprehensive mental wellness/sobriety plan.  Rozann Lesches. 08/27/2019

## 2019-08-27 NOTE — Progress Notes (Signed)
°  [facility] Adult Case Management Discharge Plan :  Will you be returning to the same living situation after discharge:  Yes,  pt reports that she is returning home. At discharge, do you have transportation home?: Yes,  CSW will assist with transportation needs. Do you have the ability to pay for your medications: Yes,  [employer] [employer]  Release of information consent forms completed and in the chart;  Patient's signature needed at discharge.  Patient to Follow up at:  Follow-up Information    [facility] Follow up.   Why: Your ACT team will follow up with you on 08/30/2019 in the early afternoon.  Thanks! Contact information: [address]  [place], [place] [address] Phone: [phone] Fax: [phone]              Next level of care provider has access to [facility] and Suicide Prevention discussed: Yes,  SPE completed with patient.     Has patient been referred to the Quitline?: Patient refused referral  Patient has been referred for addiction treatment: Pt. refused referral  Rozann Lesches, LCSW 08/27/2019, 12:50 PM
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
## 2020-04-21 ENCOUNTER — Emergency Department: Payer: Medicaid Other

## 2020-04-21 ENCOUNTER — Other Ambulatory Visit: Payer: Self-pay

## 2020-04-21 ENCOUNTER — Observation Stay
Admission: EM | Admit: 2020-04-21 | Discharge: 2020-04-22 | DRG: 918 | Discharge status: Against Medical Advice | Payer: Medicaid Other | Attending: Internal Medicine | Admitting: Internal Medicine

## 2020-04-21 DIAGNOSIS — T50901D Poisoning by unspecified drugs, medicaments and biological substances, accidental (unintentional), subsequent encounter: Secondary | ICD-10-CM | POA: Diagnosis not present

## 2020-04-21 DIAGNOSIS — D509 Iron deficiency anemia, unspecified: Secondary | ICD-10-CM | POA: Diagnosis not present

## 2020-04-21 DIAGNOSIS — T391X1A Poisoning by 4-Aminophenol derivatives, accidental (unintentional), initial encounter: Secondary | ICD-10-CM

## 2020-04-21 DIAGNOSIS — R410 Disorientation, unspecified: Secondary | ICD-10-CM

## 2020-04-21 DIAGNOSIS — Z6830 Body mass index (BMI) 30.0-30.9, adult: Secondary | ICD-10-CM

## 2020-04-21 DIAGNOSIS — T40601A Poisoning by unspecified narcotics, accidental (unintentional), initial encounter: Secondary | ICD-10-CM

## 2020-04-21 DIAGNOSIS — Z5329 Procedure and treatment not carried out because of patient's decision for other reasons: Secondary | ICD-10-CM | POA: Diagnosis not present

## 2020-04-21 DIAGNOSIS — Z888 Allergy status to other drugs, medicaments and biological substances status: Secondary | ICD-10-CM | POA: Diagnosis not present

## 2020-04-21 DIAGNOSIS — Z9884 Bariatric surgery status: Secondary | ICD-10-CM

## 2020-04-21 DIAGNOSIS — R945 Abnormal results of liver function studies: Secondary | ICD-10-CM

## 2020-04-21 DIAGNOSIS — Z832 Family history of diseases of the blood and blood-forming organs and certain disorders involving the immune mechanism: Secondary | ICD-10-CM

## 2020-04-21 DIAGNOSIS — F419 Anxiety disorder, unspecified: Secondary | ICD-10-CM | POA: Diagnosis present

## 2020-04-21 DIAGNOSIS — E538 Deficiency of other specified B group vitamins: Secondary | ICD-10-CM | POA: Diagnosis not present

## 2020-04-21 DIAGNOSIS — Z79899 Other long term (current) drug therapy: Secondary | ICD-10-CM

## 2020-04-21 DIAGNOSIS — R760 Raised antibody titer: Secondary | ICD-10-CM | POA: Diagnosis present

## 2020-04-21 DIAGNOSIS — Z20822 Contact with and (suspected) exposure to covid-19: Secondary | ICD-10-CM | POA: Diagnosis not present

## 2020-04-21 DIAGNOSIS — F319 Bipolar disorder, unspecified: Secondary | ICD-10-CM | POA: Diagnosis not present

## 2020-04-21 DIAGNOSIS — Z880 Allergy status to penicillin: Secondary | ICD-10-CM | POA: Diagnosis not present

## 2020-04-21 DIAGNOSIS — T424X1A Poisoning by benzodiazepines, accidental (unintentional), initial encounter: Secondary | ICD-10-CM

## 2020-04-21 DIAGNOSIS — I2510 Atherosclerotic heart disease of native coronary artery without angina pectoris: Secondary | ICD-10-CM | POA: Diagnosis not present

## 2020-04-21 DIAGNOSIS — D6862 Lupus anticoagulant syndrome: Secondary | ICD-10-CM | POA: Diagnosis not present

## 2020-04-21 DIAGNOSIS — Z86711 Personal history of pulmonary embolism: Secondary | ICD-10-CM

## 2020-04-21 DIAGNOSIS — Z86718 Personal history of other venous thrombosis and embolism: Secondary | ICD-10-CM

## 2020-04-21 DIAGNOSIS — Z7982 Long term (current) use of aspirin: Secondary | ICD-10-CM

## 2020-04-21 DIAGNOSIS — R9389 Abnormal findings on diagnostic imaging of other specified body structures: Secondary | ICD-10-CM | POA: Diagnosis not present

## 2020-04-21 DIAGNOSIS — R7989 Other specified abnormal findings of blood chemistry: Secondary | ICD-10-CM | POA: Diagnosis not present

## 2020-04-21 DIAGNOSIS — Z833 Family history of diabetes mellitus: Secondary | ICD-10-CM

## 2020-04-21 DIAGNOSIS — Z8673 Personal history of transient ischemic attack (TIA), and cerebral infarction without residual deficits: Secondary | ICD-10-CM

## 2020-04-21 DIAGNOSIS — Z825 Family history of asthma and other chronic lower respiratory diseases: Secondary | ICD-10-CM

## 2020-04-21 DIAGNOSIS — R4182 Altered mental status, unspecified: Secondary | ICD-10-CM | POA: Diagnosis present

## 2020-04-21 DIAGNOSIS — Z7901 Long term (current) use of anticoagulants: Secondary | ICD-10-CM | POA: Diagnosis not present

## 2020-04-21 DIAGNOSIS — N179 Acute kidney failure, unspecified: Secondary | ICD-10-CM | POA: Diagnosis present

## 2020-04-21 DIAGNOSIS — T402X1A Poisoning by other opioids, accidental (unintentional), initial encounter: Secondary | ICD-10-CM

## 2020-04-21 DIAGNOSIS — E669 Obesity, unspecified: Secondary | ICD-10-CM | POA: Diagnosis present

## 2020-04-21 DIAGNOSIS — Z95828 Presence of other vascular implants and grafts: Secondary | ICD-10-CM

## 2020-04-21 DIAGNOSIS — F32A Depression, unspecified: Secondary | ICD-10-CM | POA: Diagnosis present

## 2020-04-21 DIAGNOSIS — Z91040 Latex allergy status: Secondary | ICD-10-CM | POA: Diagnosis not present

## 2020-04-21 DIAGNOSIS — K219 Gastro-esophageal reflux disease without esophagitis: Secondary | ICD-10-CM | POA: Diagnosis present

## 2020-04-21 DIAGNOSIS — J45909 Unspecified asthma, uncomplicated: Secondary | ICD-10-CM | POA: Diagnosis present

## 2020-04-21 DIAGNOSIS — Z9151 Personal history of suicidal behavior: Secondary | ICD-10-CM

## 2020-04-21 DIAGNOSIS — E860 Dehydration: Secondary | ICD-10-CM | POA: Diagnosis present

## 2020-04-21 DIAGNOSIS — J690 Pneumonitis due to inhalation of food and vomit: Secondary | ICD-10-CM

## 2020-04-21 DIAGNOSIS — T391X2A Poisoning by 4-Aminophenol derivatives, intentional self-harm, initial encounter: Secondary | ICD-10-CM | POA: Diagnosis present

## 2020-04-21 DIAGNOSIS — G894 Chronic pain syndrome: Secondary | ICD-10-CM | POA: Diagnosis present

## 2020-04-21 DIAGNOSIS — Z8249 Family history of ischemic heart disease and other diseases of the circulatory system: Secondary | ICD-10-CM

## 2020-04-21 DIAGNOSIS — T50901A Poisoning by unspecified drugs, medicaments and biological substances, accidental (unintentional), initial encounter: Secondary | ICD-10-CM | POA: Diagnosis present

## 2020-04-21 LAB — URINALYSIS, COMPLETE (UACMP) WITH MICROSCOPIC
Glucose, UA: NEGATIVE mg/dL
Hgb urine dipstick: NEGATIVE
Ketones, ur: NEGATIVE mg/dL
Leukocytes,Ua: NEGATIVE
Nitrite: NEGATIVE
Protein, ur: 30 mg/dL — AB
Specific Gravity, Urine: 1.029 (ref 1.005–1.030)
pH: 5 (ref 5.0–8.0)

## 2020-04-21 LAB — T4, FREE: Free T4: 0.92 ng/dL (ref 0.61–1.12)

## 2020-04-21 LAB — URINE DRUG SCREEN, QUALITATIVE (ARMC ONLY)
Amphetamines, Ur Screen: NOT DETECTED
Barbiturates, Ur Screen: NOT DETECTED
Benzodiazepine, Ur Scrn: POSITIVE — AB
Cannabinoid 50 Ng, Ur ~~LOC~~: NOT DETECTED
Cocaine Metabolite,Ur ~~LOC~~: NOT DETECTED
MDMA (Ecstasy)Ur Screen: NOT DETECTED
Methadone Scn, Ur: NOT DETECTED
Opiate, Ur Screen: POSITIVE — AB
Phencyclidine (PCP) Ur S: NOT DETECTED
Tricyclic, Ur Screen: NOT DETECTED

## 2020-04-21 LAB — HEMOGLOBIN A1C
Hgb A1c MFr Bld: 5.4 % (ref 4.8–5.6)
Mean Plasma Glucose: 108.28 mg/dL

## 2020-04-21 LAB — COMPREHENSIVE METABOLIC PANEL
ALT: 366 U/L — ABNORMAL HIGH (ref 0–44)
AST: 431 U/L — ABNORMAL HIGH (ref 15–41)
Albumin: 3.9 g/dL (ref 3.5–5.0)
Alkaline Phosphatase: 378 U/L — ABNORMAL HIGH (ref 38–126)
Anion gap: 8 (ref 5–15)
BUN: 23 mg/dL — ABNORMAL HIGH (ref 6–20)
CO2: 21 mmol/L — ABNORMAL LOW (ref 22–32)
Calcium: 8.9 mg/dL (ref 8.9–10.3)
Chloride: 109 mmol/L (ref 98–111)
Creatinine, Ser: 1.06 mg/dL — ABNORMAL HIGH (ref 0.44–1.00)
GFR, Estimated: >60 mL/min (ref >60)
Glucose, Bld: 97 mg/dL (ref 70–99)
Potassium: 3.9 mmol/L (ref 3.5–5.1)
Sodium: 138 mmol/L (ref 135–145)
Total Bilirubin: 1.5 mg/dL — ABNORMAL HIGH (ref 0.3–1.2)
Total Protein: 7.1 g/dL (ref 6.5–8.1)

## 2020-04-21 LAB — CBC
HCT: 35.7 % — ABNORMAL LOW (ref 36.0–46.0)
Hemoglobin: 10.3 g/dL — ABNORMAL LOW (ref 12.0–15.0)
MCH: 25.9 pg — ABNORMAL LOW (ref 26.0–34.0)
MCHC: 28.9 g/dL — ABNORMAL LOW (ref 30.0–36.0)
MCV: 89.7 fL (ref 80.0–100.0)
Platelets: 181 10*3/uL (ref 150–400)
RBC: 3.98 MIL/uL (ref 3.87–5.11)
RDW: 16.7 % — ABNORMAL HIGH (ref 11.5–15.5)
WBC: 3.6 10*3/uL — ABNORMAL LOW (ref 4.0–10.5)
nRBC: 0 % (ref 0.0–0.2)

## 2020-04-21 LAB — D-DIMER, QUANTITATIVE: D-Dimer, Quant: 0.84 ug/mL-FEU — ABNORMAL HIGH (ref 0.00–0.50)

## 2020-04-21 LAB — SALICYLATE LEVEL: Salicylate Lvl: <7 mg/dL — ABNORMAL LOW (ref 7.0–30.0)

## 2020-04-21 LAB — APTT: aPTT: 28 seconds (ref 24–36)

## 2020-04-21 LAB — TSH: TSH: 0.746 u[IU]/mL (ref 0.350–4.500)

## 2020-04-21 LAB — ACETAMINOPHEN LEVEL: Acetaminophen (Tylenol), Serum: 34 ug/mL — ABNORMAL HIGH (ref 10–30)

## 2020-04-21 LAB — LACTIC ACID, PLASMA: Lactic Acid, Venous: 1.1 mmol/L (ref 0.5–1.9)

## 2020-04-21 LAB — PHOSPHORUS: Phosphorus: 2.7 mg/dL (ref 2.5–4.6)

## 2020-04-21 LAB — PROTIME-INR
INR: 1.2 (ref 0.8–1.2)
Prothrombin Time: 14.3 seconds (ref 11.4–15.2)

## 2020-04-21 LAB — RESP PANEL BY RT-PCR (FLU A&B, COVID) ARPGX2
Influenza A by PCR: NEGATIVE
Influenza B by PCR: NEGATIVE
SARS Coronavirus 2 by RT PCR: NEGATIVE

## 2020-04-21 LAB — TROPONIN I (HIGH SENSITIVITY)
Troponin I (High Sensitivity): 8 ng/L (ref <18)
Troponin I (High Sensitivity): 9 ng/L (ref <18)

## 2020-04-21 LAB — GAMMA GT: GGT: 300 U/L — ABNORMAL HIGH (ref 7–50)

## 2020-04-21 LAB — POC URINE PREG, ED: Preg Test, Ur: NEGATIVE

## 2020-04-21 LAB — PROCALCITONIN: Procalcitonin: 0.28 ng/mL

## 2020-04-21 LAB — CK: Total CK: 343 U/L — ABNORMAL HIGH (ref 38–234)

## 2020-04-21 LAB — MAGNESIUM: Magnesium: 2.6 mg/dL — ABNORMAL HIGH (ref 1.7–2.4)

## 2020-04-21 LAB — ETHANOL: Alcohol, Ethyl (B): <10 mg/dL (ref <10)

## 2020-04-21 MED ORDER — CLINDAMYCIN HCL 150 MG PO CAPS
300.0000 mg | ORAL_CAPSULE | Freq: Once | ORAL | Status: AC
  Administered 2020-04-21: 300 mg via ORAL
  Filled 2020-04-21: qty 2

## 2020-04-21 MED ORDER — ACETYLCYSTEINE 20 % IN SOLN
140.0000 mg/kg | Freq: Once | RESPIRATORY_TRACT | Status: AC
  Administered 2020-04-21: 11740 mg via ORAL
  Filled 2020-04-21: qty 60

## 2020-04-21 MED ORDER — CLONAZEPAM 1 MG PO TABS
1.0000 mg | ORAL_TABLET | Freq: Two times a day (BID) | ORAL | Status: DC
  Administered 2020-04-21 – 2020-04-22 (×2): 1 mg via ORAL
  Filled 2020-04-21 (×2): qty 2

## 2020-04-21 MED ORDER — PANTOPRAZOLE SODIUM 40 MG IV SOLR
40.0000 mg | INTRAVENOUS | Status: DC
  Administered 2020-04-21: 40 mg via INTRAVENOUS
  Filled 2020-04-21: qty 40

## 2020-04-21 MED ORDER — ENOXAPARIN SODIUM 80 MG/0.8ML ~~LOC~~ SOLN
80.0000 mg | Freq: Two times a day (BID) | SUBCUTANEOUS | Status: DC
  Administered 2020-04-21 – 2020-04-22 (×2): 80 mg via SUBCUTANEOUS
  Filled 2020-04-21 (×3): qty 0.8

## 2020-04-21 MED ORDER — PROPRANOLOL HCL ER 120 MG PO CP24
120.0000 mg | ORAL_CAPSULE | Freq: Every day | ORAL | Status: DC
  Filled 2020-04-21 (×2): qty 1

## 2020-04-21 MED ORDER — CLINDAMYCIN PHOSPHATE 300 MG/50ML IV SOLN
300.0000 mg | Freq: Three times a day (TID) | INTRAVENOUS | Status: DC
  Administered 2020-04-21 – 2020-04-22 (×3): 300 mg via INTRAVENOUS
  Filled 2020-04-21 (×7): qty 50

## 2020-04-21 MED ORDER — VILAZODONE HCL 20 MG PO TABS
1.0000 | ORAL_TABLET | Freq: Every day | ORAL | Status: DC
  Administered 2020-04-22: 20 mg via ORAL
  Filled 2020-04-21: qty 1

## 2020-04-21 MED ORDER — OLANZAPINE 10 MG PO TABS
20.0000 mg | ORAL_TABLET | Freq: Every day | ORAL | Status: DC
  Administered 2020-04-21: 20 mg via ORAL
  Filled 2020-04-21 (×2): qty 2

## 2020-04-21 MED ORDER — ONDANSETRON HCL 4 MG/2ML IJ SOLN
4.0000 mg | Freq: Once | INTRAMUSCULAR | Status: AC
  Administered 2020-04-21: 4 mg via INTRAVENOUS
  Filled 2020-04-21: qty 2

## 2020-04-21 MED ORDER — VORTIOXETINE HBR 5 MG PO TABS
20.0000 mg | ORAL_TABLET | Freq: Every day | ORAL | Status: DC
  Administered 2020-04-22: 20 mg via ORAL
  Filled 2020-04-21: qty 4

## 2020-04-21 MED ORDER — LURASIDONE HCL 40 MG PO TABS
40.0000 mg | ORAL_TABLET | Freq: Every day | ORAL | Status: DC
  Administered 2020-04-22: 40 mg via ORAL
  Filled 2020-04-21: qty 1

## 2020-04-21 MED ORDER — ASPIRIN 81 MG PO CHEW
81.0000 mg | CHEWABLE_TABLET | Freq: Every day | ORAL | Status: DC
Start: 2020-04-22 — End: 2020-04-23
  Administered 2020-04-22: 81 mg via ORAL
  Filled 2020-04-21: qty 1

## 2020-04-21 MED ORDER — SODIUM CHLORIDE 0.9 % IV SOLN: Freq: Once | INTRAVENOUS | Status: AC

## 2020-04-21 MED ORDER — ACETYLCYSTEINE 20 % IN SOLN
70.0000 mg/kg | RESPIRATORY_TRACT | Status: DC
  Filled 2020-04-21 (×4): qty 30

## 2020-04-21 NOTE — ED Notes (Signed)
[facility]  papers  rescinded per  dr  Jari Pigg md

## 2020-04-21 NOTE — ED Notes (Signed)
Pt denies attempting to take medications to hurt herself. States "why would I take 55 pills?".
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
## 2020-04-21 NOTE — ED Notes (Addendum)
Pt denies SI/HI. States got prescription refilled and has taken several pills a day in order to try and get more comfortable. Denies taking handful of pills at one time today. A&Ox4. Reports continued weakness in R hand, L leg, and R leg since old stroke. States no recent changes in weakness or ability. Speech somewhat slurred. Phlebotomy at bedside attempting to collect blood-work. Pt states "I keep nauseous" when asked if nauseous. Pt currently holds conversation easily.

## 2020-04-21 NOTE — ED Notes (Signed)
Pt given personal belongings that significant other dropped off.

## 2020-04-21 NOTE — ED Notes (Signed)
See triage note. Pt leaving for CT. Phlebotomy called and is to assist with blood collection once pt back from CT. Pt currently alert and calmly laying in bed. Pt reports history of stroke.

## 2020-04-21 NOTE — ED Notes (Addendum)
Pt given food brought by significant other. Given gingerale as requested. Placed on purewick as requested. Pt had soft BM. BM not caught in hat as intended. Pt notified next time she uses restroom to make sure BM collected in hat in toilet. Pt provided self with peri care.

## 2020-04-21 NOTE — ED Notes (Signed)
Called pharm. States mucomyst should be tubed in next 15 minutes.

## 2020-04-21 NOTE — Consult Note (Signed)
MEDICATION RELATED CONSULT NOTE - INITIAL   Pharmacy Consult for N-acetlycycsteine  Indication: Acetaminophen Overdose   Patient Measurements: Height: 5\' 5"  (165.1 cm) Weight: 83.9 kg (185 lb) IBW/kg (Calculated) : 57  Vital Signs: Temp: 99.1 F (37.3 C) (03/04 1447) Temp Source: Oral (03/04 1447) BP: 114/81 (03/04 2005) Pulse Rate: 71 (03/04 2005)  Labs: Recent Labs    04/21/20 1535  WBC 3.6*  HGB 10.3*  HCT 35.7*  PLT 181  CREATININE 1.06*  ALBUMIN 3.9  PROT 7.1  AST 431*  ALT 366*  ALKPHOS 378*  BILITOT 1.5*    Medications:  Scheduled:  . acetylcysteine  140 mg/kg Oral Once   Followed by  . [START ON 04/22/2020] acetylcysteine  70 mg/kg Oral Q4H  . ondansetron (ZOFRAN) IV  4 mg Intravenous Once   Assessment: 50 y.o. female presents with EMS after overdose on oxycodone-acetaminophen. Patient last filled Percocet 5-325 mg tabs 2 days ago, per notes: 55 pills missing. Pharmacy as been consulted for acetylcysteine   Baseline: PT/INR, CMP, APAP level, urine pregnancy test, salicylate level ordered and reviewed   [place] [facility] Contacted [phone]): 04/21/20 @ 2000 (Contact person: Langley Gauss)  Recommendations: 24 hours of acetylcysteine IV/PO therapy   Repeat labs with last dose of oral medication or at 22 hr mark of IV therapy  Poison control to call back with follow up   Plan:   24 hours of PO acetylcysteine: 140 mg/kg LD, followed by 70 mg/kg PO Q4H x 5 doses  Ensure dose is repeated if emesis occurs within 1 hour of any dose   Recheck LFTs, APAP level, PT/INR with last dose of oral NAC    Follow up with poison control center   Dorothe Pea, PharmD, BCPS Clinical Pharmacist  04/21/2020,8:19 PM

## 2020-04-21 NOTE — ED Notes (Signed)
Pt keeps unplugging self to get up to restroom or to try and change clothes. Has been repeatedly asked to use call bell so this RN can assist as needed and plug pt back into monitor as soon as possible when pt done. However, pt continues to unplug herself for these activities without using call bell. Pt will use call bell at others times excessively to notify that she needs drinks, food, blankets, and so on.

## 2020-04-21 NOTE — ED Notes (Signed)
Stretcher moved next to bedside toilet. Pt assisted up to toilet with hat present to obtain urine sample. Non-slip yellow fall risk socks applied to pt's feet. Stretcher locked low. Rail up. Call bell within reach. Sample sent to lab.

## 2020-04-21 NOTE — ED Notes (Signed)
Provider Clapacs at bedside. Pt alert, calm, cooperative. Pt talking with provider.

## 2020-04-21 NOTE — ED Triage Notes (Signed)
Pt comes EMS after overdose on oxycodone. Pt had 4mg  narcan intranasally. Pt was initally unresponsive with shallow breaths at O2 at 85%. Pt was bagged then placed on NRB. Is now 99% on RA. Pt remains confused but responsive/alert. Pt oxy rx was refilled 2 days ago and today bottle had 55 missing. Pt states she took them all.

## 2020-04-21 NOTE — ED Notes (Signed)
IV team at bedside. Food tray placed on pt's bedside table along with drink.

## 2020-04-22 DIAGNOSIS — Z95828 Presence of other vascular implants and grafts: Secondary | ICD-10-CM

## 2020-04-22 DIAGNOSIS — R9389 Abnormal findings on diagnostic imaging of other specified body structures: Secondary | ICD-10-CM | POA: Diagnosis not present

## 2020-04-22 DIAGNOSIS — T50901D Poisoning by unspecified drugs, medicaments and biological substances, accidental (unintentional), subsequent encounter: Secondary | ICD-10-CM

## 2020-04-22 DIAGNOSIS — R945 Abnormal results of liver function studies: Secondary | ICD-10-CM | POA: Diagnosis not present

## 2020-04-22 DIAGNOSIS — Z86711 Personal history of pulmonary embolism: Secondary | ICD-10-CM

## 2020-04-22 DIAGNOSIS — N179 Acute kidney failure, unspecified: Secondary | ICD-10-CM | POA: Diagnosis not present

## 2020-04-22 DIAGNOSIS — F32A Depression, unspecified: Secondary | ICD-10-CM

## 2020-04-22 DIAGNOSIS — R760 Raised antibody titer: Secondary | ICD-10-CM

## 2020-04-22 DIAGNOSIS — F419 Anxiety disorder, unspecified: Secondary | ICD-10-CM

## 2020-04-22 DIAGNOSIS — R410 Disorientation, unspecified: Secondary | ICD-10-CM | POA: Diagnosis not present

## 2020-04-22 DIAGNOSIS — R4182 Altered mental status, unspecified: Secondary | ICD-10-CM

## 2020-04-22 LAB — PROCALCITONIN: Procalcitonin: <0.1 ng/mL

## 2020-04-22 LAB — BASIC METABOLIC PANEL
Anion gap: 10 (ref 5–15)
BUN: 12 mg/dL (ref 6–20)
CO2: 19 mmol/L — ABNORMAL LOW (ref 22–32)
Calcium: 8.8 mg/dL — ABNORMAL LOW (ref 8.9–10.3)
Chloride: 111 mmol/L (ref 98–111)
Creatinine, Ser: 0.73 mg/dL (ref 0.44–1.00)
GFR, Estimated: >60 mL/min (ref >60)
Glucose, Bld: 185 mg/dL — ABNORMAL HIGH (ref 70–99)
Potassium: 3.5 mmol/L (ref 3.5–5.1)
Sodium: 140 mmol/L (ref 135–145)

## 2020-04-22 LAB — HEPATIC FUNCTION PANEL
ALT: 252 U/L — ABNORMAL HIGH (ref 0–44)
ALT: 206 U/L — ABNORMAL HIGH (ref 0–44)
AST: 200 U/L — ABNORMAL HIGH (ref 15–41)
AST: 109 U/L — ABNORMAL HIGH (ref 15–41)
Albumin: 3.4 g/dL — ABNORMAL LOW (ref 3.5–5.0)
Albumin: 3.4 g/dL — ABNORMAL LOW (ref 3.5–5.0)
Alkaline Phosphatase: 316 U/L — ABNORMAL HIGH (ref 38–126)
Alkaline Phosphatase: 281 U/L — ABNORMAL HIGH (ref 38–126)
Bilirubin, Direct: 0.1 mg/dL (ref 0.0–0.2)
Bilirubin, Direct: 0.1 mg/dL (ref 0.0–0.2)
Indirect Bilirubin: 0.5 mg/dL (ref 0.3–0.9)
Indirect Bilirubin: 0.6 mg/dL (ref 0.3–0.9)
Total Bilirubin: 0.6 mg/dL (ref 0.3–1.2)
Total Bilirubin: 0.7 mg/dL (ref 0.3–1.2)
Total Protein: 6.6 g/dL (ref 6.5–8.1)
Total Protein: 6.8 g/dL (ref 6.5–8.1)

## 2020-04-22 LAB — CBC
HCT: 33.4 % — ABNORMAL LOW (ref 36.0–46.0)
Hemoglobin: 9.9 g/dL — ABNORMAL LOW (ref 12.0–15.0)
MCH: 26.1 pg (ref 26.0–34.0)
MCHC: 29.6 g/dL — ABNORMAL LOW (ref 30.0–36.0)
MCV: 88.1 fL (ref 80.0–100.0)
Platelets: 172 10*3/uL (ref 150–400)
RBC: 3.79 MIL/uL — ABNORMAL LOW (ref 3.87–5.11)
RDW: 16.6 % — ABNORMAL HIGH (ref 11.5–15.5)
WBC: 4 10*3/uL (ref 4.0–10.5)
nRBC: 0 % (ref 0.0–0.2)

## 2020-04-22 LAB — HEPATITIS PANEL, ACUTE
HCV Ab: NONREACTIVE
Hep A IgM: NONREACTIVE
Hep B C IgM: NONREACTIVE
Hepatitis B Surface Ag: NONREACTIVE

## 2020-04-22 LAB — TROPONIN I (HIGH SENSITIVITY): Troponin I (High Sensitivity): 7 ng/L (ref <18)

## 2020-04-22 LAB — PROTIME-INR
INR: 1.1 (ref 0.8–1.2)
Prothrombin Time: 13.5 seconds (ref 11.4–15.2)

## 2020-04-22 LAB — ACETAMINOPHEN LEVEL
Acetaminophen (Tylenol), Serum: <10 ug/mL — ABNORMAL LOW (ref 10–30)
Acetaminophen (Tylenol), Serum: <10 ug/mL — ABNORMAL LOW (ref 10–30)

## 2020-04-22 LAB — VITAMIN B12: Vitamin B-12: 397 pg/mL (ref 180–914)

## 2020-04-22 LAB — OCCULT BLOOD X 1 CARD TO LAB, STOOL: Fecal Occult Bld: NEGATIVE

## 2020-04-22 MED ORDER — DEXTROSE 5 % IV SOLN
15.0000 mg/kg/h | INTRAVENOUS | Status: DC
  Administered 2020-04-22: 15 mg/kg/h via INTRAVENOUS
  Filled 2020-04-22 (×2): qty 120

## 2020-04-22 MED ORDER — ACETYLCYSTEINE LOAD VIA INFUSION
70.0000 mg/kg | Freq: Once | INTRAVENOUS | Status: AC
  Administered 2020-04-22: 5873 mg via INTRAVENOUS
  Filled 2020-04-22: qty 147

## 2020-04-22 NOTE — ED Notes (Signed)
Spoke with PC at this time who recommended changing to IV acetylcysteine.  Start with IV bolus of 70 mg/kg x1 hr, then maintenance dose of 15 mg/kg/hr x23 hrs. Need to repeat tylenol level, LFTs, and INR before acetylcysteine maintenance dose finishes.

## 2020-04-22 NOTE — Progress Notes (Addendum)
Patient highly upset because she will not get Ambien and Trazodone. Explained to patient reasoning behind it. Not effective. She signed AMA form. Explained consequences of leaving AMA. Verbalized understanding. Charge RN and Stark Klein, NP notified.  PIV removed.

## 2020-04-22 NOTE — ED Notes (Signed)
RN informed of bed assigned 

## 2020-04-22 NOTE — Progress Notes (Signed)
Poison control RN Press photographer and stated that it was okay to stop ordered drip r/t improved ordered blood levels discussed over phone call. MD aware. See new orders.

## 2020-04-22 NOTE — ED Notes (Signed)
Pt stated she would not take another dose of the mucomyst prescribed to her.  Dr. Posey Pronto made aware.

## 2020-04-22 NOTE — ED Notes (Addendum)
Paula from [facility] given update on pt's condition. Rpt ECG advised and will be completed shortly.

## 2020-04-22 NOTE — Progress Notes (Signed)
PROGRESS NOTE  Leslie Marks MPN:[id number] DOB: 1971/02/04 DOA: 04/21/2020 PCP: Patient, No Pcp Per  Brief History   Leslie Marks is a 50 y.o. female presents to the emergency room with altered mental status, EMS was called by her roommate because of her altered mental status patient was lethargic, per report patient had taken all of her narcotic medication.  Per patient this was not meant as a suicidal attempt she was taking it as prescribed that is what she thought.  On arrival to the emergency room patient was admitted and evaluated by psychiatrist for suicidal ideation.  Per psychiatry no risk of self-harm or risk to others.  Patient did not meet criteria for inpatient psychiatric admission.  Patient came via EMS with suspected oxycodone overdose, patient received 4 mg of Narcan intranasally as she was unresponsive with hypoxia at 85.  Patient was bagged and placed on nonrebreather with O2 sats improving to 99%.  Per report patient had her medications refilled about 2 days ago and currently that bottle is missing 55 pills per report.  Patient is otherwise a poor historian.  She does have history of suicidal ideation and attempts in the past but denies any attempts today.   Per pt roommate called ems because she told her to she felt like she was going into a coma.  Pt says she has gone into a coma in past and felt like it was happening again. EMS was giving narcan and she told ems she was going into a coma. D/w pt that her liver test is high and she says she knows it has been high before.  Patient has past medical history of iron deficiency anemia, anxiety, asthma, B12 deficiency, benzodiazepine dependence, borderline personality, congestive heart failure, clotting disorder, coronary artery disease, DVT/PE, GERD, history of stroke with hemiparesis affecting the right side, methadone overdose history, history of Roux-en-Y gastric bypass, lupus anticoagulant positive,hypoglycemia, hypotension,  depression, polysubstance abuse, QT prolongation, pancreatitis, syncope, vitamin D deficiency, 6 miscarriages and lupus history. [facility] psych practice is who fills her narcotic meds . Says she did not take her meds. The bottle is at home.   For emergency call boyfriend as below. Pt says she is on Lovenox 80 bid for many years per hematologist.  Consultants  . None  Procedures  . None  Antibiotics   Anti-infectives (From admission, onward)   Start     Dose/Rate Route Frequency Ordered Stop   04/21/20 2130  clindamycin (CLEOCIN) IVPB 300 mg        300 mg 100 mL/hr over 30 Minutes Intravenous Every 8 hours 04/21/20 2046     04/21/20 1800  clindamycin (CLEOCIN) capsule 300 mg        300 mg Oral  Once 04/21/20 1751 04/21/20 1804    .  Subjective  The patient is sitting up on the gurney. She is wanting to leave AMA. She is awake, alert and oriented x 3. I have explained to her the risk she runs in leaving before the acetylcysteine is completed. She initially states that she still wants to leave, then she tells the nurse that she will stay.   Objective   Vitals:  Vitals:   04/22/20 1419 04/22/20 1555  BP: 101/68 111/86  Pulse: 88 90  Resp: 17 16  Temp:  98.5 F (36.9 C)  SpO2: 98% 95%   Exam:  Constitutional:  . The patient is awake, alert, and oriented x 3. No acute distress. Respiratory:  .  No increased work of breathing. . No wheezes, rales, or rhonchi . No tactile fremitus Cardiovascular:  . Regular rate and rhythm . No murmurs, ectopy, or gallups. . No lateral PMI. No thrills. Abdomen:  . Abdomen is soft, non-tender, non-distended . No hernias, masses, or organomegaly . Normoactive bowel sounds.  Musculoskeletal:  . No cyanosis, clubbing, or edema Skin:  . No rashes, lesions, ulcers . palpation of skin: no induration or nodules Neurologic:  . CN 2-12 intact . Sensation all 4 extremities intact Psychiatric:  . Mental status o Mood,  affect appropriate o Orientation to person, place, time  . judgment and insight appear intact  I have personally reviewed the following:   Today's Data  . Vitals, LFT's, BMP, CBC  Imaging  . CT head without contrast  Cardiology Data  . EKG  Scheduled Meds: . aspirin  81 mg Oral Daily  . clonazePAM  1 mg Oral BID  . enoxaparin (LOVENOX) injection  80 mg Subcutaneous Q12H  . lurasidone  40 mg Oral Daily  . OLANZapine  20 mg Oral QHS  . pantoprazole (PROTONIX) IV  40 mg Intravenous Q24H  . propranolol ER  120 mg Oral QHS  . Vilazodone HCl  1 tablet Oral Q1500  . vortioxetine HBr  20 mg Oral Daily   Continuous Infusions: . acetylcysteine 15 mg/kg/hr (04/22/20 0652)  . clindamycin (CLEOCIN) IV Stopped (04/22/20 1123)    Principal Problem:   Altered mental status, unspecified Active Problems:   Anxiety and depression   Chronic anticoagulation   Lupus anticoagulant positive   Abnormal LFTs (liver function tests)   History of pulmonary embolism   Iron deficiency anemia   S/P insertion of IVC (inferior vena caval) filter   Polysubstance overdose   AKI (acute kidney injury) (HCC)   Abnormal chest x-ray   AMS (altered mental status)   LOS: 1 day   A & P  [name]. Due to polysubstance overdose. Accidental not suicidal.   Polysubstance overdose: Per ED provider patient has a history concerning of narcotic overdose, was in control has been contacted with recommendations for NAC for the next 24 hours.  Patient denies suicidal ideation or intent. She now agrees to stay to completed NAC.   Abnormal LFTs: Improved. Monitor. Patient is completing acetylcysteine.  Liver ultrasound shows fatty liver with dilated common bile duct, GGT is pending. A1c is 5.4.  Acute kidney injury: Resolved. Creatinine is 0.73. We will continue with hydration for normal saline over the next 24 hours.  Monitor renal function and avoid contrast studies.  Anxiety and depression: Home regimen  of clonazepam, Viibryd, Trintellix continued. Med rec has been addressed.  Chronic anticoagulation/lupus anticoagulant/history of PE/history of DVT/status post IVC filter: Patient has an extensive history of DVT PE miscarriages and was found to positive lupus anticoagulant, is currently being followed by [facility] hematology and is currently on Lovenox 80 mg every 12.  Continued the same.  Anemia/iron deficiency anemia/vitamin B12 deficiency: Patient has had an endoscopy in 2010, where no ulcers were found at the gastric bypass site. I suspect patient has a combination of iron deficiency anemia due to her bypass surgery, as well as possibly chronic blood loss.  We will obtain an anemia panel and a stool occult, check vitamin B12 level.  Chest pain: After reviewing charts patient has had echocardiograms at [facility] and evaluation for her chest pain that has been ongoing. Troponins are negative and EKG is without signs of ischemia. As her chest pain is atypical  and possibly GI related, and the patient is anxious to leave, I will have the patient follow up with her PCP as outpatient and start protonix.  Pt denies chest pain now.   Elevated D-dimer: Pt has mildly elevated d-dimer. No c/o sob  And she is currently on therapeutic doses of Lovenox.  I have seen and examined this patient myself. I have spent 35 minutes in her evaluation and care.  DVT prophylaxis:  SCD's/ Lovenox per [facility] hematology. Code Status:  Full Code Family Communication:  Leslie Marks: [phone]. Disposition Plan:  Status is: Inpatient  Remains inpatient appropriate because:IV treatments appropriate due to intensity of illness or inability to take PO   Dispo: The patient is from: Home              Anticipated d/c is to: Home              Patient currently is not medically stable to d/c.   Difficult to place patient No  Leslie Swayze, DO [facility] Direct contact: see [email or web address]  7PM-7AM contact night  coverage as above 04/22/2020, 5:44 PM  LOS: 1 day

## 2020-04-22 NOTE — Progress Notes (Signed)
   04/22/20 2009  Provider Notification  Provider Name/Title Stark Klein, NP  Date Provider Notified 04/22/20  Time Provider Notified 2009  Notification Type Page  Notification Reason Other (Comment) (Patient asking for Ambien and Trazodone.)  Provider response Other (Comment) (waiting for response)   Patient is asking for Trazodone and Ambien which she states she takes every night. Threatening to go AMA if not given. Stark Klein, NP notified. Waiting for response.

## 2020-04-22 NOTE — Consult Note (Signed)
MEDICATION RELATED CONSULT NOTE - INITIAL   Pharmacy Consult for N-acetlycycsteine  Indication: Acetaminophen Overdose   Patient Measurements: Height: 5\' 5"  (165.1 cm) Weight: 83.9 kg (185 lb) IBW/kg (Calculated) : 57  Vital Signs: Temp: 99.1 F (37.3 C) (03/04 1447) Temp Source: Oral (03/04 1447) BP: 125/69 (03/04 2356) Pulse Rate: 88 (03/04 2356)  Labs: Recent Labs    04/21/20 1535 04/21/20 2002  WBC 3.6*  --   HGB 10.3*  --   HCT 35.7*  --   PLT 181  --   APTT  --  28  CREATININE 1.06*  --   MG 2.6*  --   PHOS 2.7  --   ALBUMIN 3.9  --   PROT 7.1  --   AST 431*  --   ALT 366*  --   ALKPHOS 378*  --   BILITOT 1.5*  --     Medications:  Scheduled:  . acetylcysteine  70 mg/kg Oral Q4H  . aspirin  81 mg Oral Daily  . clonazePAM  1 mg Oral BID  . enoxaparin (LOVENOX) injection  80 mg Subcutaneous Q12H  . lurasidone  40 mg Oral Daily  . OLANZapine  20 mg Oral QHS  . pantoprazole (PROTONIX) IV  40 mg Intravenous Q24H  . propranolol ER  120 mg Oral QHS  . Vilazodone HCl  1 tablet Oral Q1500  . vortioxetine HBr  20 mg Oral Daily   Assessment: 50 y.o. female presents with EMS after overdose on oxycodone-acetaminophen. Patient last filled Percocet 5-325 mg tabs 2 days ago, per notes: 55 pills missing. Pharmacy as been consulted for acetylcysteine   Baseline: PT/INR, CMP, APAP level, urine pregnancy test, salicylate level ordered and reviewed   [place] [facility] Control Contacted [phone]): 04/21/20 @ 2000 (Contact person: Langley Gauss)  Recommendations: 24 hours of acetylcysteine IV/PO therapy   Repeat labs with last dose of oral medication or at 22 hr mark of IV therapy  Poison control to call back with follow up   Plan:   24 hours of PO acetylcysteine: 140 mg/kg LD, followed by 70 mg/kg PO Q4H x 5 doses  Ensure dose is repeated if emesis occurs within 1 hour of any dose   Recheck LFTs, APAP level, PT/INR with last dose of oral NAC    Follow up with poison  control center   3/5:  N-AC dose due @ 0000 was refused by pt.   Orene Desanctis, PharmD Clinical Pharmacist  04/22/2020,1:12 AM

## 2020-04-22 NOTE — Consult Note (Signed)
MEDICATION RELATED CONSULT NOTE - INITIAL   Pharmacy Consult for N-acetlycycsteine  Indication: Acetaminophen Overdose   Patient Measurements: Height: 5\' 5"  (165.1 cm) Weight: 83.9 kg (185 lb) IBW/kg (Calculated) : 57  Vital Signs: BP: 99/72 (03/05 0329) Pulse Rate: 77 (03/05 0329)  Labs: Recent Labs    04/21/20 1535 04/21/20 2002 04/22/20 0402  WBC 3.6*  --  4.0  HGB 10.3*  --  9.9*  HCT 35.7*  --  33.4*  PLT 181  --  172  APTT  --  28  --   CREATININE 1.06*  --   --   MG 2.6*  --   --   PHOS 2.7  --   --   ALBUMIN 3.9  --   --   PROT 7.1  --   --   AST 431*  --   --   ALT 366*  --   --   ALKPHOS 378*  --   --   BILITOT 1.5*  --   --     Medications:  Scheduled:  . acetylcysteine  70 mg/kg Intravenous Once  . aspirin  81 mg Oral Daily  . clonazePAM  1 mg Oral BID  . enoxaparin (LOVENOX) injection  80 mg Subcutaneous Q12H  . lurasidone  40 mg Oral Daily  . OLANZapine  20 mg Oral QHS  . pantoprazole (PROTONIX) IV  40 mg Intravenous Q24H  . propranolol ER  120 mg Oral QHS  . Vilazodone HCl  1 tablet Oral Q1500  . vortioxetine HBr  20 mg Oral Daily   Assessment: 50 y.o. female presents with EMS after overdose on oxycodone-acetaminophen. Patient last filled Percocet 5-325 mg tabs 2 days ago, per notes: 55 pills missing. Pharmacy as been consulted for acetylcysteine   Baseline: PT/INR, CMP, APAP level, urine pregnancy test, salicylate level ordered and reviewed   [place] [facility] Control Contacted [phone]): 04/21/20 @ 2000 (Contact person: Langley Gauss)  Recommendations: 24 hours of acetylcysteine IV/PO therapy   Repeat labs with last dose of oral medication or at 22 hr mark of IV therapy  Poison control to call back with follow up   Plan:   24 hours of PO acetylcysteine: 140 mg/kg LD, followed by 70 mg/kg PO Q4H x 5 doses  Ensure dose is repeated if emesis occurs within 1 hour of any dose   Recheck LFTs, APAP level, PT/INR with last dose of oral NAC     Follow up with poison control center   3/5:  N-AC dose due @ 0000 was refused by pt.   3/5:  Pt is refusing any additional PO doses.  RN spoke with [facility] who recommended changing to IV N-AC. Pt already had 1 PO dose of N-AC on 3/4 @ 2054.   Poison Control recommended starting with half usual loading dose  (70 mg/kg instead of 150 mg/kg).   Will order N-AC bolus of 5,873 mg (70 mg/kg) to run over 1 hr followed by N-AC infusion to run at 1,258.5 mg/hr (31.5 ml/hr) over 23 hrs.   Orene Desanctis, PharmD Clinical Pharmacist  04/22/2020,4:33 AM

## 2020-04-22 NOTE — ED Notes (Signed)
Ed rn secure messaged hospitalist regarding pts request to leave

## 2020-04-22 NOTE — ED Notes (Signed)
Pt noted to have bed wet with urine, and disconnected from monitor.  Pt's bed cleaned of urine, new sheets applied to bed and new purewick applied to pt.  Pt given new warm blankets, ginger ale and graham crackers at this time.  Will continue to monitor.

## 2020-04-24 NOTE — Discharge Summary (Signed)
Physician Discharge Summary  Leslie Marks QMG:[id number] DOB: 10/24/1970 DOA: 04/21/2020  PCP: Patient, No Pcp Per  Admit date: 04/21/2020 Discharge date: 04/24/2020  Recommendations for Outpatient Follow-up:  1. Patient left AMA.  Discharge Diagnoses: Principal diagnosis is #1 1. Accidental overdose 2. Acetaminophen overdose 3. Elevated LFT's 4. Anxiety and depression 5. AKI 6. Chronic anticoagulation/lupus anticoagulant/history of PE 7. Anemia due to iron deficiency 8. Vitamin B-12 deficiency 9. Chest pain 10. Elevated D Dimer  Discharge Condition: Patient left AMA  Disposition: AMA  Diet recommendation: AMA  Filed Weights   04/21/20 1433  Weight: 83.9 kg    History of present illness:  Leslie Marks is a 50 y.o. female presents to the emergency room with altered mental status, EMS was called by her roommate because of her altered mental status patient was lethargic, per report patient had taken all of her narcotic medication.  Per patient this was not meant as a suicidal attempt she was taking it as prescribed that is what she thought.  On arrival to the emergency room patient was admitted and evaluated by psychiatrist for suicidal ideation.  Per psychiatry no risk of self-harm or risk to others.  Patient did not meet criteria for inpatient psychiatric admission.  Patient came via EMS with suspected oxycodone overdose, patient received 4 mg of Narcan intranasally as she was unresponsive with hypoxia at 85.  Patient was bagged and placed on nonrebreather with O2 sats improving to 99%.  Per report patient had her medications refilled about 2 days ago and currently that bottle is missing 55 pills per report.  Patient is otherwise a poor historian.  She does have history of suicidal ideation and attempts in the past but denies any attempts today.   Per pt roommate called ems because she told her to she felt like she was going into a coma.  Pt says she has gone into a coma in past  and felt like it was happening again. EMS was giving narcan and she told ems she was going into a coma. D/w pt that her liver test is high and she says she knows it has been high before.  Patient has past medical history of iron deficiency anemia, anxiety, asthma, B12 deficiency, benzodiazepine dependence, borderline personality, congestive heart failure, clotting disorder, coronary artery disease, DVT/PE, GERD, history of stroke with hemiparesis affecting the right side, methadone overdose history, history of Roux-en-Y gastric bypass, lupus anticoagulant positive,hypoglycemia, hypotension, depression, polysubstance abuse, QT prolongation, pancreatitis, syncope, vitamin D deficiency, 6 miscarriages and lupus history. [facility] is who fills her narcotic meds . Says she did not take her meds. The bottle is at home.   Hospital Course: The patient is sitting up on the gurney. She is wanting to leave AMA. She is awake, alert and oriented x 3. I have explained to her the risk she runs in leaving before the acetylcysteine is completed. She initially states that she still wants to leave, then she tells the nurse that she will stay. She did complete her 24 hours of NAC.  Later that day the patient became irate that I would not restart her Percocet which was one of the medications that she overdosed on. She left AMA.   Today's assessment: S: The patient is awake and alert. She is oppositional. O: Vitals:  Vitals:   04/22/20 1419 04/22/20 1555  BP: 101/68 111/86  Pulse: 88 90  Resp: 17 16  Temp:  98.5 F (36.9 C)  SpO2: 98% 95%   Exam:  Constitutional:  . The patient is awake, alert, and oriented x 3. No acute distress. Respiratory:  . No increased work of breathing. . No wheezes, rales, or rhonchi . No tactile fremitus Cardiovascular:  . Regular rate and rhythm . No murmurs, ectopy, or gallups. . No lateral PMI. No thrills. Abdomen:  . Abdomen is soft,  non-tender, non-distended . No hernias, masses, or organomegaly . Normoactive bowel sounds.  Musculoskeletal:  . No cyanosis, clubbing, or edema Skin:  . No rashes, lesions, ulcers . palpation of skin: no induration or nodules Neurologic:  . CN 2-12 intact . Sensation all 4 extremities intact Psychiatric:  . Mental status o Mood, affect appropriate o Orientation to person, place, time  . judgment and insight appear intact  Discharge Instructions   Allergies as of 04/22/2020      Reactions   Amoxicillin Anaphylaxis   Augmentin [amoxicillin-pot Clavulanate] Anaphylaxis   Betadine [povidone Iodine] Anaphylaxis   Ciprofloxacin Anaphylaxis   Erythromycin Anaphylaxis   Latex Anaphylaxis   Penicillins Anaphylaxis   Has patient had a PCN reaction causing immediate rash, facial/tongue/throat swelling, SOB or lightheadedness with hypotension: yes Has patient had a PCN reaction causing severe rash involving mucus membranes or skin necrosis: no Has patient had a PCN reaction that required hospitalization no Has patient had a PCN reaction occurring within the last 10 years: no If all of the above answers are "NO", then may proceed with Cephalosporin use.   Adhesive [tape] Other (See Comments)   Skin "bubbles" and blisters   Lisinopril Cough      Medication List    ASK your doctor about these medications   aspirin 81 MG chewable tablet Chew 1 tablet (81 mg total) by mouth daily.   B-D 3CC LUER-LOK SYR 25GX1" 25G X 1" 3 ML Misc Generic drug: SYRINGE-NEEDLE (DISP) 3 ML AS DIRECTED FOR 90 DAYS   blood glucose meter kit and supplies Kit Dispense based on patient and insurance preference. Use once daily fasting and as needed. (FOR ICD-9 250.00, 250.01).   buPROPion 150 MG 24 hr tablet Commonly known as: WELLBUTRIN XL Take 150 mg by mouth daily.   clonazePAM 1 MG tablet Commonly known as: KLONOPIN Take 1 tablet by mouth 2 (two) times daily.   cyanocobalamin 1000 MCG/ML  injection Commonly known as: (VITAMIN B-12) INJECT 1 ML INTO THE SKIN ONCE A MONTH   DULoxetine 30 MG capsule Commonly known as: CYMBALTA Take 30 mg by mouth daily.   esomeprazole 40 MG capsule Commonly known as: NEXIUM Take 1 capsule (40 mg total) by mouth 2 (two) times daily before a meal.   fluticasone 50 MCG/ACT nasal spray Commonly known as: FLONASE Place 2 sprays into both nostrils daily.   gabapentin 600 MG tablet Commonly known as: NEURONTIN Take by mouth 3 (three) times daily.   lurasidone 40 MG Tabs tablet Commonly known as: LATUDA Take 40 mg by mouth daily.   naloxone 0.4 MG/ML injection Commonly known as: NARCAN To be used as needed for overdose   ondansetron 8 MG tablet Commonly known as: ZOFRAN Take 8 mg by mouth every 8 (eight) hours as needed for nausea.   oxyCODONE-acetaminophen 10-325 MG tablet Commonly known as: PERCOCET Take 1 tablet by mouth every 4 (four) hours as needed for pain.   propranolol 120 MG 24 hr capsule Commonly known as: INNOPRAN XL Take 120 mg by mouth at bedtime.   Requip 0.5 MG tablet Generic drug: rOPINIRole Take  0.5 mg by mouth at bedtime.   tiZANidine 4 MG tablet Commonly known as: ZANAFLEX Take 4 mg by mouth 2 (two) times daily.   topiramate 200 MG tablet Commonly known as: TOPAMAX Take 200 mg by mouth daily.   traMADol 50 MG tablet Commonly known as: ULTRAM Take 50-100 mg by mouth 3 (three) times daily.   trazodone 300 MG tablet Commonly known as: DESYREL Take 300 mg by mouth at bedtime.   Viibryd 20 MG Tabs Generic drug: Vilazodone HCl Take 1 tablet by mouth daily in the afternoon.   vortioxetine HBr 20 MG Tabs tablet Commonly known as: TRINTELLIX Take 20 mg by mouth daily.   zolpidem 10 MG tablet Commonly known as: AMBIEN Take 10 mg by mouth at bedtime.   ZyPREXA 20 MG tablet Generic drug: OLANZapine Take 20 mg by mouth at bedtime.      Allergies  Allergen Reactions  . Amoxicillin Anaphylaxis   . Augmentin [Amoxicillin-Pot Clavulanate] Anaphylaxis  . Betadine [Povidone Iodine] Anaphylaxis  . Ciprofloxacin Anaphylaxis  . Erythromycin Anaphylaxis  . Latex Anaphylaxis  . Penicillins Anaphylaxis    Has patient had a PCN reaction causing immediate rash, facial/tongue/throat swelling, SOB or lightheadedness with hypotension: yes Has patient had a PCN reaction causing severe rash involving mucus membranes or skin necrosis: no Has patient had a PCN reaction that required hospitalization no Has patient had a PCN reaction occurring within the last 10 years: no If all of the above answers are "NO", then may proceed with Cephalosporin use.   . Adhesive [Tape] Other (See Comments)    Skin "bubbles" and blisters  . Lisinopril Cough    The results of significant diagnostics from this hospitalization (including imaging, microbiology, ancillary and laboratory) are listed below for reference.    Significant Diagnostic Studies: CT Head Wo Contrast  Result Date: 04/21/2020 CLINICAL DATA:  Headache, classic migraine. 4 mg Narcan intranasally administered. Unresponsive with shallow breaths. EXAM: CT HEAD WITHOUT CONTRAST TECHNIQUE: Contiguous axial images were obtained from the base of the skull through the vertex without intravenous contrast. COMPARISON:  CT head 08/25/2019 FINDINGS: Brain: No evidence of large-territorial acute infarction. No parenchymal hemorrhage. No mass lesion. No extra-axial collection. No mass effect or midline shift. No hydrocephalus. Basilar cisterns are patent. Vascular: No hyperdense vessel. Skull: No acute fracture or focal lesion. Sinuses/Orbits: Paranasal sinuses and mastoid air cells are clear. The orbits are unremarkable. Other: None. IMPRESSION: No acute intracranial abnormality. Electronically Signed   By: Iven Finn M.D.   On: 04/21/2020 15:29   DG Chest Portable 1 View  Result Date: 04/21/2020 CLINICAL DATA:  Shortness of breath. EXAM: PORTABLE CHEST 1 VIEW  COMPARISON:  Radiograph 08/25/2019 FINDINGS: Persistent low lung volumes. Stable heart size and mediastinal contours. There are streaky opacities in both mid lungs and right lower lung zone. No pulmonary edema. No pleural fluid or pneumothorax. Stable osseous structures. IMPRESSION: Low lung volumes. Streaky opacities in both mid lungs and right lower lung zone, may represent atelectasis or pneumonia, including aspiration. Electronically Signed   By: Keith Rake M.D.   On: 04/21/2020 15:32   US ABDOMEN LIMITED RUQ (LIVER/GB)  Result Date: 04/21/2020 CLINICAL DATA:  Abnormal liver function tests EXAM: ULTRASOUND ABDOMEN LIMITED RIGHT UPPER QUADRANT COMPARISON:  06/22/2019 FINDINGS: Gallbladder: Surgically absent Common bile duct: Diameter: 11 mm Liver: Increased liver echotexture consistent with hepatic steatosis. No focal liver abnormality. Mild intrahepatic duct dilation. Portal vein is patent on color Doppler imaging with normal direction of blood flow  towards the liver. Other: None. IMPRESSION: 1. Status post cholecystectomy, with expected postsurgical dilatation of the common bile duct. No change since recent CT. 2. Hepatic steatosis. Electronically Signed   By: Randa Ngo M.D.   On: 04/21/2020 18:50    Microbiology: Recent Results (from the past 240 hour(s))  Resp Panel by RT-PCR (Flu A&B, Covid) Nasopharyngeal Swab     Status: None   Collection Time: 04/21/20  8:02 PM   Specimen: Nasopharyngeal Swab; Nasopharyngeal(NP) swabs in vial transport medium  Result Value Ref Range Status   SARS Coronavirus 2 by RT PCR NEGATIVE NEGATIVE Final    Comment: (NOTE) SARS-CoV-2 target nucleic acids are NOT DETECTED.  The SARS-CoV-2 RNA is generally detectable in upper respiratory specimens during the acute phase of infection. The lowest concentration of SARS-CoV-2 viral copies this assay can detect is 138 copies/mL. A negative result does not preclude SARS-Cov-2 infection and should not be used  as the sole basis for treatment or other patient management decisions. A negative result may occur with  improper specimen collection/handling, submission of specimen other than nasopharyngeal swab, presence of viral mutation(s) within the areas targeted by this assay, and inadequate number of viral copies(<138 copies/mL). A negative result must be combined with clinical observations, patient history, and epidemiological information. The expected result is Negative.  Fact Sheet for Patients:  EntrepreneurPulse.com.au  Fact Sheet for Healthcare Providers:  IncredibleEmployment.be  This test is no t yet approved or cleared by the [place] FDA and  has been authorized for detection and/or diagnosis of SARS-CoV-2 by FDA under an Emergency Use Authorization (EUA). This EUA will remain  in effect (meaning this test can be used) for the duration of the COVID-19 declaration under Section 564(b)(1) of the Act, 21 U.S.C.section 360bbb-3(b)(1), unless the authorization is terminated  or revoked sooner.       Influenza A by PCR NEGATIVE NEGATIVE Final   Influenza B by PCR NEGATIVE NEGATIVE Final    Comment: (NOTE) The Xpert Xpress SARS-CoV-2/FLU/RSV plus assay is intended as an aid in the diagnosis of influenza from Nasopharyngeal swab specimens and should not be used as a sole basis for treatment. Nasal washings and aspirates are unacceptable for Xpert Xpress SARS-CoV-2/FLU/RSV testing.  Fact Sheet for Patients: EntrepreneurPulse.com.au  Fact Sheet for Healthcare Providers: IncredibleEmployment.be  This test is not yet approved or cleared by the [place] FDA and has been authorized for detection and/or diagnosis of SARS-CoV-2 by FDA under an Emergency Use Authorization (EUA). This EUA will remain in effect (meaning this test can be used) for the duration of the COVID-19 declaration under Section 564(b)(1)  of the Act, 21 U.S.C. section 360bbb-3(b)(1), unless the authorization is terminated or revoked.  Performed at [facility], [address] [place], [place] [address]      Labs: Basic Metabolic Panel: Recent Labs  Lab 04/21/20 1535 04/22/20 0402  NA 138 140  K 3.9 3.5  CL 109 111  CO2 21* 19*  GLUCOSE 97 185*  BUN 23* 12  CREATININE 1.06* 0.73  CALCIUM 8.9 8.8*  MG 2.6*  --   PHOS 2.7  --    Liver Function Tests: Recent Labs  Lab 04/21/20 1535 04/22/20 0402 04/22/20 1615  AST 431* 200* 109*  ALT 366* 252* 206*  ALKPHOS 378* 316* 281*  BILITOT 1.5* 0.6 0.7  PROT 7.1 6.6 6.8  ALBUMIN 3.9 3.4* 3.4*   No results for input(s): LIPASE, AMYLASE in the last 168 hours. No results for input(s):  AMMONIA in the last 168 hours. CBC: Recent Labs  Lab 04/21/20 1535 04/22/20 0402  WBC 3.6* 4.0  HGB 10.3* 9.9*  HCT 35.7* 33.4*  MCV 89.7 88.1  PLT 181 172   Cardiac Enzymes: Recent Labs  Lab 04/21/20 2002  CKTOTAL 343*   BNP: BNP (last 3 results) No results for input(s): BNP in the last 8760 hours.  ProBNP (last 3 results) No results for input(s): PROBNP in the last 8760 hours.  CBG: No results for input(s): GLUCAP in the last 168 hours.  Principal Problem:   Altered mental status, unspecified Active Problems:   Anxiety and depression   Chronic anticoagulation   Lupus anticoagulant positive   Abnormal LFTs (liver function tests)   History of pulmonary embolism   Iron deficiency anemia   S/P insertion of IVC (inferior vena caval) filter   Polysubstance overdose   AKI (acute kidney injury) ([place])   Abnormal chest x-ray   AMS (altered mental status)   Time coordinating discharge: 38 minutes.  Signed:        Ava Swayze, DO [facility]  04/24/2020, 2:23 PM

## 2020-06-18 DEATH — deceased
